# Patient Record
Sex: Male | Born: 1960 | Race: White | Hispanic: No | Marital: Married | Smoking: Current every day smoker
Health system: Southern US, Community
[De-identification: ages and names within clinical notes are randomized; demographics above are authoritative.]

## PROBLEM LIST (undated history)

## (undated) DIAGNOSIS — I10 Essential (primary) hypertension: Secondary | ICD-10-CM

## (undated) DIAGNOSIS — M1990 Unspecified osteoarthritis, unspecified site: Secondary | ICD-10-CM

## (undated) DIAGNOSIS — Z87442 Personal history of urinary calculi: Secondary | ICD-10-CM

## (undated) DIAGNOSIS — M549 Dorsalgia, unspecified: Secondary | ICD-10-CM

## (undated) DIAGNOSIS — K219 Gastro-esophageal reflux disease without esophagitis: Secondary | ICD-10-CM

## (undated) DIAGNOSIS — G4730 Sleep apnea, unspecified: Secondary | ICD-10-CM

## (undated) HISTORY — DX: Essential (primary) hypertension: I10

## (undated) HISTORY — DX: Gastro-esophageal reflux disease without esophagitis: K21.9

## (undated) HISTORY — PX: OTHER SURGICAL HISTORY: SHX169

## (undated) HISTORY — PX: HAND SURGERY: SHX662

## (undated) HISTORY — PX: APPENDECTOMY: SHX54

## (undated) HISTORY — DX: Dorsalgia, unspecified: M54.9

## (undated) MED FILL — Paclitaxel IV Conc 300 MG/50ML (6 MG/ML): INTRAVENOUS | Qty: 17 | Status: AC

## (undated) MED FILL — Dexamethasone Sodium Phosphate Inj 100 MG/10ML: INTRAMUSCULAR | Qty: 1 | Status: AC

## (undated) MED FILL — Carboplatin IV Soln 450 MG/45ML: INTRAVENOUS | Qty: 30 | Status: AC

---

## 2017-11-02 ENCOUNTER — Ambulatory Visit: Payer: 59 | Admitting: Neurology

## 2017-11-02 ENCOUNTER — Telehealth: Payer: Self-pay | Admitting: Neurology

## 2017-11-02 ENCOUNTER — Encounter: Payer: Self-pay | Admitting: Neurology

## 2017-11-02 VITALS — BP 130/92 | HR 98 | Ht 69.0 in | Wt 267.0 lb

## 2017-11-02 DIAGNOSIS — M5440 Lumbago with sciatica, unspecified side: Secondary | ICD-10-CM

## 2017-11-02 DIAGNOSIS — M5416 Radiculopathy, lumbar region: Secondary | ICD-10-CM

## 2017-11-02 MED ORDER — TIZANIDINE HCL 4 MG PO TABS: 4.0000 mg | ORAL_TABLET | Freq: Three times a day (TID) | ORAL | 5 refills | Status: DC | PRN

## 2017-11-02 MED ORDER — NORTRIPTYLINE HCL 10 MG PO CAPS: ORAL_CAPSULE | ORAL | 11 refills | Status: DC

## 2017-11-02 NOTE — Patient Instructions (Addendum)
1. Start nortriptyline 10mg : Take 1 cap every night for 1 week, then increase to 2 caps every night for 2 weeks, then increase to 3 caps every night. Call our office for an update after 2 weeks of taking this dose, we can increase dose further if tolerated  2. Take muscle relaxant tizanidine 4mg  up to every 8 hours to help with pain  3. Refer to [facility] in [place]  4. Refer to Neurosurgery for lumbar radiculopathy and back pain. We are referring you to [facility]. They will call you directly to set up an appointment date and time. If you do not hear from them they can be contacted directly at [phone].

## 2017-11-02 NOTE — Telephone Encounter (Signed)
Referral faxed to [facility] at [phone] with confirmation received. They will contact the patient to schedule.  Referral faxed to [facility] in [place] for physical therapy located at [address] Ph - [phone] Fax - [phone]. Fax confirmation received. They will call patient directly to schedule.

## 2017-11-02 NOTE — Progress Notes (Signed)
NEUROLOGY CONSULTATION NOTE  Ian Case MRN: [id number] DOB: Oct 24, 1960  Referring provider: Dr. Bernie Covey Primary care provider: Dr. Bernie Covey  Reason for consult:  Lumbar radiculopathy  Dear Dr Nance Pew:  Thank you for your kind referral of Ian Case for consultation of the above symptoms. Although his history is well known to you, please allow me to reiterate it for the purpose of our medical record. The patient was accompanied to the clinic by his wife who also provides collateral information. Records and images were personally reviewed where available.  HISTORY OF PRESENT ILLNESS: This is a 57 year old right-handed man with a history of hypertension, GERD, presenting for evaluation of lumbar radiculopathy. He reports chronic back pain, however over the past 2 years or so, he has had pain in his left lower back radiating down below his left buttock region and down his left leg. He describes numbness and burning that is constant on his left leg and foot, but also occasionally affecting his right leg/foot. He occasionally drags his leg when the pain is severe. He has tried Gabapentin and Lyrica which caused side effects of dizziness. He has been taking Oxycontin twice a day with no improvement. He had seen [facility] a year ago or so and had an MRI lumbar spine (report unavailable for review) and 3 injections with no improvement. He was told he needed to lose weight before considering surgery, but exercise has been difficult due to back pain. He has not tried Physical Therapy. He tried Flexeril and Prednisone in the past. He has occasional constipation, no incontinence. Arms are unaffected. He denies any neck pain but has frequent headaches in the back of his head, no associated nausea/vomiting/photo or phonophobia. He has dizziness when looking up. No diplopia, dysarthria/dysphagia.   Diagnostic Data: Lumbar xray done 09/27/2017 showed mild disc space  narrowing at L2-3 and L4-5, anterior osteophyte formation especially at L3-4 and L4-5, advanced facet arthrosis most severe at L4-5 and L5-S1. 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    PAST MEDICAL HISTORY: Past Medical History:  Diagnosis Date  . Back pain   . GERD (gastroesophageal reflux disease)   . Hypertension     PAST SURGICAL HISTORY: Past Surgical History:  Procedure Laterality Date  . APPENDECTOMY      MEDICATIONS: Current Outpatient Medications on File Prior to Visit  Medication Sig Dispense Refill  . Aspirin-Acetaminophen-Caffeine (GOODYS EXTRA STRENGTH) 3511031157 MG PACK Take by mouth.    Marland Kitchen omeprazole (PRILOSEC) 20 MG capsule Take 20 mg by mouth daily.    Marland Kitchen oxyCODONE (OXYCONTIN) 10 mg 12 hr tablet Take 10 mg by mouth every 12 (twelve) hours.    . triamterene-hydrochlorothiazide (DYAZIDE) 37.5-25 MG capsule Take 1 capsule by mouth daily.     No current facility-administered medications on file prior to visit.     ALLERGIES: No Known Allergies  FAMILY HISTORY: Family History  Problem Relation Age of Onset  . Thyroid disease Mother   . Heart disease Father   . Healthy Daughter   . Healthy Son     SOCIAL HISTORY: Social History   Socioeconomic History  . Marital status: Married    Spouse name: Not on file  . Number of children: Not on file  . Years of education: Not on file  . Highest education level: Not on file  Occupational History  . Not on file  Social Needs  . Financial  resource strain: Not on file  . Food insecurity:    Worry: Not on file    Inability: Not on file  . Transportation needs:    Medical: Not on file    Non-medical: Not on file  Tobacco Use  . Smoking status: Current Every Day Smoker  . Smokeless tobacco: Never Used  Substance and Sexual Activity  . Alcohol use: Yes    Comment: 3-4 drinks daily  . Drug use: Never  . Sexual activity: Not on file    Lifestyle  . Physical activity:    Days per week: Not on file    Minutes per session: Not on file  . Stress: Not on file  Relationships  . Social connections:    Talks on phone: Not on file    Gets together: Not on file    Attends religious service: Not on file    Active member of club or organization: Not on file    Attends meetings of clubs or organizations: Not on file    Relationship status: Not on file  . Intimate partner violence:    Fear of current or ex partner: Not on file    Emotionally abused: Not on file    Physically abused: Not on file    Forced sexual activity: Not on file  Other Topics Concern  . Not on file  Social History Narrative  . Not on file    REVIEW OF SYSTEMS: Constitutional: No fevers, chills, or sweats, no generalized fatigue, change in appetite Eyes: No visual changes, double vision, eye pain Ear, nose and throat: No hearing loss, ear pain, nasal congestion, sore throat Cardiovascular: No chest pain, palpitations Respiratory:  No shortness of breath at rest or with exertion, wheezes GastrointestinaI: No nausea, vomiting, diarrhea, abdominal pain, fecal incontinence Genitourinary:  No dysuria, urinary retention or frequency Musculoskeletal:  No neck pain, back pain Integumentary: No rash, pruritus, skin lesions Neurological: as above Psychiatric: No depression, insomnia, anxiety Endocrine: No palpitations, fatigue, diaphoresis, mood swings, change in appetite, change in weight, increased thirst Hematologic/Lymphatic:  No anemia, purpura, petechiae. Allergic/Immunologic: no itchy/runny eyes, nasal congestion, recent allergic reactions, rashes  PHYSICAL EXAM: Vitals:   11/02/17 0837  BP: (!) 130/92  Pulse: 98  SpO2: 96%   General: No acute distress Head:  Normocephalic/atraumatic Eyes: Fundoscopic exam shows bilateral sharp discs, no vessel changes, exudates, or hemorrhages Neck: supple, no paraspinal tenderness, full range of  motion Back: No paraspinal tenderness Heart: regular rate and rhythm Lungs: Clear to auscultation bilaterally. Vascular: No carotid bruits. Skin/Extremities: No rash, no edema Neurological Exam: Mental status: alert and oriented to person, place, and time, no dysarthria or aphasia, Fund of knowledge is appropriate.  Recent and remote memory are intact.  Attention and concentration are normal.    Able to name objects and repeat phrases. Cranial nerves: CN I: not tested CN II: pupils equal, round and reactive to light, visual fields intact, fundi unremarkable. CN III, IV, VI:  full range of motion, no nystagmus, no ptosis CN V: facial sensation intact CN VII: upper and lower face symmetric CN VIII: hearing intact to finger rub CN IX, X: gag intact, uvula midline CN XI: sternocleidomastoid and trapezius muscles intact CN XII: tongue midline Bulk & Tone: normal, no fasciculations. Motor: 5/5 throughout with no pronator drift. Sensation: intact to light touch, cold, pin on both UE, decreased pin, cold in the distribution of mostly left L4, L5 distribution (inner calf, dorsum and sole of foot). Decreased vibration sense  to left knee.  No extinction to double simultaneous stimulation.  Romberg test negative Deep Tendon Reflexes: +2 throughout, no ankle clonus Plantar responses: downgoing bilaterally Cerebellar: no incoordination on finger to nose testing Gait: antalgic gait avoiding weight-bearing on left leg due to pain in left lower back   IMPRESSION: This is a 57 year old right-handed man with a history of chronic back pain that over the past year or two has worsened now radiating down his left leg with burning pain and numbness. Pain has been unresponsive to oxycodone and Flexeril. He has seen Ortho in the past and had an MRI lumbar spine, results unavailable and have been requested for review. Xray report from PCP indicates multilevel degenerative change with advanced facet arthrosis most  severe at L4-5 and L5-S1. Exam shows sensory changes in the L4-5 distribution, no weakness noted, significant pain reported. We discussed the importance of physical therapy, he is agreeable to trying it but does not feel it will help. Start nortriptyline for lumbar radiculopathy (tried gabapentin, Lyrica in the past), side effects discussed, we may uptitrate as tolerated. He will try a different muscle relaxant, Tizanidine, side effects discussed. We discussed obtaining a second opinion from Neurosurgery regarding treatment options (ESI, surgery). He will follow-up in 6 months and knows to call for any changes.   Thank you for allowing me to participate in the care of this patient. Please do not hesitate to call for any questions or concerns.   Ellouise Newer, M.D.  CC: Dr. Nance Pew

## 2017-11-02 NOTE — Telephone Encounter (Signed)
Records from [facility] clinic reviewed, MRI was done in Feb 2018 at [facility], it showed L4-5 bilateral neural foraminal narrowing. He tried gabapentin, Cymbalta, medrol dosepak, 3 epidurals. It was noted that if no improvement, send to Dr. Jimmye [name] for possible surgical counseling.

## 2017-11-04 ENCOUNTER — Telehealth: Payer: Self-pay

## 2017-11-04 NOTE — Telephone Encounter (Signed)
Received notice from [facility].  Pt has NP appointment on 11/17/17 @ 2:15pm

## 2017-12-06 ENCOUNTER — Other Ambulatory Visit: Payer: Self-pay | Admitting: Neurosurgery

## 2017-12-06 DIAGNOSIS — M5440 Lumbago with sciatica, unspecified side: Secondary | ICD-10-CM

## 2017-12-06 DIAGNOSIS — G959 Disease of spinal cord, unspecified: Secondary | ICD-10-CM

## 2017-12-10 ENCOUNTER — Ambulatory Visit
Admission: RE | Admit: 2017-12-10 | Discharge: 2017-12-10 | Disposition: A | Discharge status: Home / Self Care | Payer: 59 | Source: Ambulatory Visit | Attending: Neurosurgery | Admitting: Neurosurgery

## 2017-12-10 DIAGNOSIS — M5440 Lumbago with sciatica, unspecified side: Secondary | ICD-10-CM

## 2017-12-10 DIAGNOSIS — G959 Disease of spinal cord, unspecified: Secondary | ICD-10-CM

## 2017-12-11 ENCOUNTER — Other Ambulatory Visit: Payer: 59

## 2017-12-13 ENCOUNTER — Encounter (HOSPITAL_COMMUNITY): Payer: Self-pay

## 2017-12-13 ENCOUNTER — Emergency Department (HOSPITAL_COMMUNITY)
Admission: EM | Admit: 2017-12-13 | Discharge: 2017-12-13 | Disposition: A | Discharge status: Home / Self Care | Payer: 59 | Attending: Emergency Medicine | Admitting: Emergency Medicine

## 2017-12-13 ENCOUNTER — Other Ambulatory Visit: Payer: Self-pay | Admitting: Neurosurgery

## 2017-12-13 ENCOUNTER — Other Ambulatory Visit: Payer: Self-pay

## 2017-12-13 DIAGNOSIS — M545 Low back pain: Secondary | ICD-10-CM | POA: Diagnosis present

## 2017-12-13 DIAGNOSIS — Z5321 Procedure and treatment not carried out due to patient leaving prior to being seen by health care provider: Secondary | ICD-10-CM | POA: Insufficient documentation

## 2017-12-13 DIAGNOSIS — M25551 Pain in right hip: Secondary | ICD-10-CM | POA: Diagnosis not present

## 2017-12-13 DIAGNOSIS — M25552 Pain in left hip: Secondary | ICD-10-CM | POA: Diagnosis not present

## 2017-12-13 MED ORDER — OXYCODONE-ACETAMINOPHEN 5-325 MG PO TABS
1.0000 | ORAL_TABLET | ORAL | Status: DC | PRN
  Administered 2017-12-13: 1 via ORAL
  Filled 2017-12-13: qty 1

## 2017-12-13 NOTE — ED Triage Notes (Signed)
Patient here for lower back pain and bilateral hip pain.  Has appointment to see Saintclair Halsted MD with neurosurgery in the AM but pain got worse and could not make it to see him.  Taking hydrocodone and ibuprofen for pain management.

## 2017-12-21 NOTE — Pre-Procedure Instructions (Signed)
Ian Case  12/21/2017      [facility] - [place], [place] [address] [place] [address] Phone: [phone] Fax: [phone]    Your procedure is scheduled on December 28, 2017.  Report to [facility] Admitting at [phone] AM.  Call this number if you have problems the morning of surgery:  [phone]   Remember:  Do not eat or drink after midnight.    Take these medicines the morning of surgery with A SIP OF WATER  Hydrocodone-acetaminophen (norco)-if needed for pain Omeprazole (prilosec) Tizanidine (zanaflex)-if needed for muscle spasms  7 days prior to surgery STOP taking any Aspirin (unless otherwise instructed by your surgeon), Aleve, Naproxen, Ibuprofen, Motrin, Advil, Goody's, BC's, all herbal medications, fish oil, and all vitamins   [facility]- Preparing For Surgery  Before surgery, you can play an important role. Because skin is not sterile, your skin needs to be as free of germs as possible. You can reduce the number of germs on your skin by washing with CHG (chlorahexidine gluconate) Soap before surgery.  CHG is an antiseptic cleaner which kills germs and bonds with the skin to continue killing germs even after washing.    Oral Hygiene is also important to reduce your risk of infection.  Remember - BRUSH YOUR TEETH THE MORNING OF SURGERY WITH YOUR REGULAR TOOTHPASTE  Please do not use if you have an allergy to CHG or antibacterial soaps. If your skin becomes reddened/irritated stop using the CHG.  Do not shave (including legs and underarms) for at least 48 hours prior to first CHG shower. It is OK to shave your face.  Please follow these instructions carefully.   1. Shower the NIGHT BEFORE SURGERY and the MORNING OF SURGERY with CHG.   2. If you chose to wash your hair, wash your hair first as usual with your normal shampoo.  3. After you shampoo, rinse your hair and body thoroughly to remove the shampoo.  4. Use CHG  as you would any other liquid soap. You can apply CHG directly to the skin and wash gently with a scrungie or a clean washcloth.   5. Apply the CHG Soap to your body ONLY FROM THE NECK DOWN.  Do not use on open wounds or open sores. Avoid contact with your eyes, ears, mouth and genitals (private parts). Wash Face and genitals (private parts)  with your normal soap.  6. Wash thoroughly, paying special attention to the area where your surgery will be performed.  7. Thoroughly rinse your body with warm water from the neck down.  8. DO NOT shower/wash with your normal soap after using and rinsing off the CHG Soap.  9. Pat yourself dry with a CLEAN TOWEL.  10. Wear CLEAN PAJAMAS to bed the night before surgery, wear comfortable clothes the morning of surgery  11. Place CLEAN SHEETS on your bed the night of your first shower and DO NOT SLEEP WITH PETS.  Day of Surgery:  Do not apply any deodorants/lotions.  Please wear clean clothes to the hospital/surgery center.   Remember to brush your teeth WITH YOUR REGULAR TOOTHPASTE.   Do not wear jewelry  Do not wear lotions, powders, or colognes, or deodorant.  Men may shave face and neck.  Do not bring valuables to the hospital.   [facility] is not responsible for any belongings or valuables.  Contacts, dentures or bridgework may not be worn into surgery.  Leave your  suitcase in the car.  After surgery it may be brought to your room.  For patients admitted to the hospital, discharge time will be determined by your treatment team.  Patients discharged the day of surgery will not be allowed to drive home.   Please read over the following fact sheets that you were given.

## 2017-12-22 ENCOUNTER — Encounter (HOSPITAL_COMMUNITY): Payer: Self-pay

## 2017-12-22 ENCOUNTER — Encounter (HOSPITAL_COMMUNITY)
Admission: RE | Admit: 2017-12-22 | Discharge: 2017-12-22 | Disposition: A | Discharge status: Home / Self Care | Payer: 59 | Source: Ambulatory Visit | Attending: Neurosurgery | Admitting: Neurosurgery

## 2017-12-22 ENCOUNTER — Other Ambulatory Visit: Payer: Self-pay

## 2017-12-22 DIAGNOSIS — Z6841 Body Mass Index (BMI) 40.0 and over, adult: Secondary | ICD-10-CM | POA: Insufficient documentation

## 2017-12-22 DIAGNOSIS — M48061 Spinal stenosis, lumbar region without neurogenic claudication: Secondary | ICD-10-CM | POA: Insufficient documentation

## 2017-12-22 DIAGNOSIS — Z87891 Personal history of nicotine dependence: Secondary | ICD-10-CM | POA: Insufficient documentation

## 2017-12-22 DIAGNOSIS — K219 Gastro-esophageal reflux disease without esophagitis: Secondary | ICD-10-CM | POA: Diagnosis not present

## 2017-12-22 DIAGNOSIS — G4733 Obstructive sleep apnea (adult) (pediatric): Secondary | ICD-10-CM | POA: Diagnosis not present

## 2017-12-22 DIAGNOSIS — Z79899 Other long term (current) drug therapy: Secondary | ICD-10-CM | POA: Insufficient documentation

## 2017-12-22 DIAGNOSIS — Z01818 Encounter for other preprocedural examination: Secondary | ICD-10-CM | POA: Diagnosis present

## 2017-12-22 DIAGNOSIS — I10 Essential (primary) hypertension: Secondary | ICD-10-CM | POA: Insufficient documentation

## 2017-12-22 HISTORY — DX: Sleep apnea, unspecified: G47.30

## 2017-12-22 HISTORY — DX: Personal history of urinary calculi: Z87.442

## 2017-12-22 HISTORY — DX: Unspecified osteoarthritis, unspecified site: M19.90

## 2017-12-22 LAB — CBC
HCT: 45 % (ref 39.0–52.0)
Hemoglobin: 15.6 g/dL (ref 13.0–17.0)
MCH: 35.3 pg — ABNORMAL HIGH (ref 26.0–34.0)
MCHC: 34.7 g/dL (ref 30.0–36.0)
MCV: 101.8 fL — ABNORMAL HIGH (ref 78.0–100.0)
PLATELETS: 218 10*3/uL (ref 150–400)
RBC: 4.42 MIL/uL (ref 4.22–5.81)
RDW: 13.3 % (ref 11.5–15.5)
WBC: 6.2 10*3/uL (ref 4.0–10.5)

## 2017-12-22 LAB — BASIC METABOLIC PANEL
Anion gap: 12 (ref 5–15)
BUN: 12 mg/dL (ref 6–20)
CALCIUM: 8.6 mg/dL — AB (ref 8.9–10.3)
CO2: 28 mmol/L (ref 22–32)
Chloride: 104 mmol/L (ref 98–111)
Creatinine, Ser: 0.83 mg/dL (ref 0.61–1.24)
GFR calc Af Amer: >60 mL/min (ref >60)
Glucose, Bld: 116 mg/dL — ABNORMAL HIGH (ref 70–99)
Potassium: 3.2 mmol/L — ABNORMAL LOW (ref 3.5–5.1)
SODIUM: 144 mmol/L (ref 135–145)

## 2017-12-22 LAB — SURGICAL PCR SCREEN
MRSA, PCR: NEGATIVE
STAPHYLOCOCCUS AUREUS: NEGATIVE

## 2017-12-22 MED ORDER — SUGAMMADEX SODIUM 200 MG/2ML IV SOLN
INTRAVENOUS | Status: AC
  Filled 2017-12-22: qty 4

## 2017-12-22 NOTE — Progress Notes (Addendum)
PCP: Bernie Covey, MD  Cardiologist: pt denies  EKG: pt denies past year, obtained today  Stress test: pt denies  ECHO: pt denies  Cardiac Cath: pt denies ever  Chest x-ray: pt denies past year, no recent respiratory infection/complications

## 2017-12-23 NOTE — Progress Notes (Signed)
Anesthesia Chart Review:  Case:  [id number] Date/Time:  12/28/17 1100   Procedure:  Laminectomy and Foraminotomy L2-L3 - L3-L4 - L4-L5 (N/A Back)   Anesthesia type:  General   Pre-op diagnosis:  Stenosis   Location:  [facility] OR ROOM 21 / [facility]   Surgeon:  Kary Kos, MD      DISCUSSION: Patient is a 57 year old male scheduled for the above procedure.   History includes smoking, HTN, GERD, OSA (does not have CPAP yet), back pain. BMI is consistent with morbid obesity. He is s/p appendectomy in [place] 10/24/16--per anesthesia records, Mallampati IV, TM distance > 3 FB, neck full ROM, mask difficulty assessment: "1 - vent by mask"; oral 8.0 mm ETT inserted with direct laryngoscopy, Macintosh #4, one attempt; Cormack-Lehane-Classification: grade 1.  If no acute changes then I would anticipate that he can proceed as planned. By notes, not currently using CPAP--defer post-operative monitoring recommendations to surgeon and/or anesthesiologist.    VS: BP 135/83   Pulse 98   Temp 36.6 C   Resp 18   Ht 5\' 9"  (1.753 m)   Wt 123.3 kg   SpO2 99%   BMI 40.15 kg/m    PROVIDERS: Bernie Covey, MD is PCP (Care Everywhere) Ellouise Newer, MD is neurologist (lumbar radiculopathy)   LABS: Labs reviewed: Acceptable for surgery. 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 However, while non-supine, good control was observed on CPAP at 11 cm   warer using a Simplus size med mask.There were moderate PLMS with an index of   59 /hr and PLMS arousal index of 1 /hr. EKG indicates normal sinus rhythm.   EEG is normal.  RECOMMENDATIONS include:   -  PAP may be initiated with the above pressure and interface and non-supine   positioning to treat the patient's sleep-disordered breathing.  -  A titration of VPAP with ASV may be considered for this patient with   complex apnea syndrome poorly tolerant of CPAP and Bilevel in non-supine   positioning cannot be maintained.  - If the patient is poorly tolerant to PAP, further anatomic evaluation for   possible surgical intervention may also be considered.    IMAGES: MR L-spine 12/10/17: IMPRESSION: 1. Progression of broad-based disc protrusion and facet disease at L4-5 with progressive severe left and moderate right foraminal stenosis. 2. Mild foraminal narrowing bilaterally at L2-3 and L3-4 is stable. 3. Mild disc bulging and facet hypertrophy at L1-2 is stable without significant stenosis or change. 4. Epidural lipomatosis contributes to crowding of nerve roots at L3-4, L4-5, and L5-S1.  MR C-spine 12/10/17: IMPRESSION: 1. Uncovertebral spurring in the cervical spine results in right greater than left foraminal narrowing at C3-4, C4-5, and C5-6 as described. 2.  Mild right foraminal narrowing is present at C2-3 and C6-7. 3. Partial effacement of ventral CSF at C3-4, right worse than left. No significant central canal stenosis is present.   EKG: 12/22/17: ST at 101 bpm. HR 98 bpm with vitals. Patient reported being in pain at PAT.    CV: N/A  Past Medical History:  Diagnosis Date  . Arthritis   . Back pain   . GERD (gastroesophageal reflux disease)   . History of kidney stones   . Hypertension   . Sleep apnea    does not have cpap yet    Past  Surgical History:  Procedure Laterality Date  . APPENDECTOMY    . HAND SURGERY      MEDICATIONS: . Aspirin-Acetaminophen-Caffeine (GOODYS EXTRA STRENGTH) 500-325-65 MG PACK  . HYDROcodone-acetaminophen (NORCO/VICODIN) 5-325 MG tablet  . ibuprofen (ADVIL,MOTRIN) 200 MG tablet  . nortriptyline (PAMELOR) 10 MG capsule  . omeprazole (PRILOSEC) 20 MG capsule  . tiZANidine (ZANAFLEX) 4 MG tablet  . triamterene-hydrochlorothiazide (MAXZIDE-25) 37.5-25 MG tablet   No current facility-administered medications for this encounter.   Patient reported not taking nortriptyline.    George Hugh [facility] Short Stay Center/Anesthesiology Phone [phone] 12/23/2017 10:46 AM

## 2017-12-25 ENCOUNTER — Emergency Department (HOSPITAL_COMMUNITY)
Admission: EM | Admit: 2017-12-25 | Discharge: 2017-12-25 | Disposition: A | Discharge status: Home / Self Care | Payer: Managed Care, Other (non HMO) | Attending: Emergency Medicine | Admitting: Emergency Medicine

## 2017-12-25 ENCOUNTER — Encounter (HOSPITAL_COMMUNITY): Payer: Self-pay | Admitting: *Deleted

## 2017-12-25 ENCOUNTER — Other Ambulatory Visit: Payer: Self-pay

## 2017-12-25 DIAGNOSIS — I10 Essential (primary) hypertension: Secondary | ICD-10-CM | POA: Diagnosis not present

## 2017-12-25 DIAGNOSIS — G8929 Other chronic pain: Secondary | ICD-10-CM | POA: Diagnosis not present

## 2017-12-25 DIAGNOSIS — F17200 Nicotine dependence, unspecified, uncomplicated: Secondary | ICD-10-CM | POA: Insufficient documentation

## 2017-12-25 DIAGNOSIS — Z79899 Other long term (current) drug therapy: Secondary | ICD-10-CM | POA: Diagnosis not present

## 2017-12-25 DIAGNOSIS — M545 Low back pain: Secondary | ICD-10-CM | POA: Diagnosis not present

## 2017-12-25 DIAGNOSIS — M549 Dorsalgia, unspecified: Secondary | ICD-10-CM | POA: Diagnosis present

## 2017-12-25 LAB — CBC WITH DIFFERENTIAL/PLATELET
Abs Immature Granulocytes: 0.1 10*3/uL (ref 0.0–0.1)
BASOS PCT: 1 %
Basophils Absolute: 0 10*3/uL (ref 0.0–0.1)
EOS ABS: 0 10*3/uL (ref 0.0–0.7)
Eosinophils Relative: 1 %
HCT: 52.5 % — ABNORMAL HIGH (ref 39.0–52.0)
Hemoglobin: 18.9 g/dL — ABNORMAL HIGH (ref 13.0–17.0)
IMMATURE GRANULOCYTES: 2 %
Lymphocytes Relative: 35 %
Lymphs Abs: 2.2 10*3/uL (ref 0.7–4.0)
MCH: 35.3 pg — ABNORMAL HIGH (ref 26.0–34.0)
MCHC: 36 g/dL (ref 30.0–36.0)
MCV: 98.1 fL (ref 78.0–100.0)
Monocytes Absolute: 0.7 10*3/uL (ref 0.1–1.0)
Monocytes Relative: 12 %
NEUTROS PCT: 49 %
Neutro Abs: 3.1 10*3/uL (ref 1.7–7.7)
PLATELETS: 201 10*3/uL (ref 150–400)
RBC: 5.35 MIL/uL (ref 4.22–5.81)
RDW: 13.4 % (ref 11.5–15.5)
WBC: 6.2 10*3/uL (ref 4.0–10.5)

## 2017-12-25 LAB — I-STAT CHEM 8, ED
BUN: 24 mg/dL — ABNORMAL HIGH (ref 6–20)
CALCIUM ION: 1.15 mmol/L (ref 1.15–1.40)
Chloride: 100 mmol/L (ref 98–111)
Creatinine, Ser: 0.8 mg/dL (ref 0.61–1.24)
GLUCOSE: 97 mg/dL (ref 70–99)
HCT: 55 % — ABNORMAL HIGH (ref 39.0–52.0)
Hemoglobin: 18.7 g/dL — ABNORMAL HIGH (ref 13.0–17.0)
POTASSIUM: 4.2 mmol/L (ref 3.5–5.1)
Sodium: 131 mmol/L — ABNORMAL LOW (ref 135–145)
TCO2: 22 mmol/L (ref 22–32)

## 2017-12-25 LAB — URINALYSIS, ROUTINE W REFLEX MICROSCOPIC
Bilirubin Urine: NEGATIVE
GLUCOSE, UA: NEGATIVE mg/dL
Hgb urine dipstick: NEGATIVE
KETONES UR: 20 mg/dL — AB
LEUKOCYTES UA: NEGATIVE
Nitrite: NEGATIVE
PROTEIN: NEGATIVE mg/dL
Specific Gravity, Urine: 1.023 (ref 1.005–1.030)
pH: 5 (ref 5.0–8.0)

## 2017-12-25 LAB — BASIC METABOLIC PANEL
ANION GAP: 16 — AB (ref 5–15)
BUN: 18 mg/dL (ref 6–20)
CALCIUM: 9.6 mg/dL (ref 8.9–10.3)
CO2: 20 mmol/L — ABNORMAL LOW (ref 22–32)
Chloride: 94 mmol/L — ABNORMAL LOW (ref 98–111)
Creatinine, Ser: 0.78 mg/dL (ref 0.61–1.24)
GFR calc non Af Amer: >60 mL/min (ref >60)
GLUCOSE: 92 mg/dL (ref 70–99)
Potassium: 7.1 mmol/L (ref 3.5–5.1)
SODIUM: 130 mmol/L — AB (ref 135–145)

## 2017-12-25 MED ORDER — DIAZEPAM 5 MG PO TABS
5.0000 mg | ORAL_TABLET | Freq: Once | ORAL | Status: AC
  Administered 2017-12-25: 5 mg via ORAL
  Filled 2017-12-25: qty 1

## 2017-12-25 MED ORDER — HYDROMORPHONE HCL 1 MG/ML IJ SOLN
1.0000 mg | Freq: Once | INTRAMUSCULAR | Status: AC
  Administered 2017-12-25: 0.5 mg via INTRAVENOUS
  Filled 2017-12-25: qty 1

## 2017-12-25 MED ORDER — HYDROMORPHONE HCL 1 MG/ML IJ SOLN
1.0000 mg | Freq: Once | INTRAMUSCULAR | Status: AC
  Administered 2017-12-25: 1 mg via INTRAVENOUS
  Filled 2017-12-25: qty 1

## 2017-12-25 MED ORDER — ONDANSETRON 4 MG PO TBDP: 4.0000 mg | ORAL_TABLET | Freq: Three times a day (TID) | ORAL | 0 refills | Status: DC | PRN

## 2017-12-25 MED ORDER — OXYCODONE-ACETAMINOPHEN 5-325 MG PO TABS
1.0000 | ORAL_TABLET | Freq: Once | ORAL | Status: AC
  Administered 2017-12-25: 1 via ORAL
  Filled 2017-12-25: qty 1

## 2017-12-25 MED ORDER — OXYCODONE-ACETAMINOPHEN 5-325 MG PO TABS: 1.0000 | ORAL_TABLET | ORAL | 0 refills | Status: DC | PRN

## 2017-12-25 MED ORDER — HYDROMORPHONE HCL 1 MG/ML IJ SOLN
0.5000 mg | Freq: Once | INTRAMUSCULAR | Status: AC
  Administered 2017-12-25: 0.5 mg via INTRAVENOUS
  Filled 2017-12-25: qty 1

## 2017-12-25 MED ORDER — ONDANSETRON HCL 4 MG/2ML IJ SOLN
4.0000 mg | Freq: Once | INTRAMUSCULAR | Status: AC
  Administered 2017-12-25: 4 mg via INTRAVENOUS
  Filled 2017-12-25: qty 2

## 2017-12-25 NOTE — ED Notes (Signed)
ED Provider at bedside. 

## 2017-12-25 NOTE — ED Notes (Signed)
Pt IV was wrapped up with Coban due to being diaphoretic and at an attempt to keep the IV in place, pt complaining of IV bothering him where it was wrapped. This RN unwrapped the Coban and noticed when giving the pt the dilaudid there was fluid leaking from the connector on the tubing. I unwrapped all the tape off of the tubing and rewrapped it. There was no leaking from the tubing when this RN reapplied new tape it and flushed line with saline. Pt only got 0.5mg  dilaudid instead of the ordered 1mg  due to it leaking on pt hand. NP notified.

## 2017-12-25 NOTE — ED Triage Notes (Signed)
Pt reports lower back pain, hx of same. Is scheduled for surgery on wed but unable to tolerate the pain. Is able to ambulate with no distress.

## 2017-12-25 NOTE — ED Notes (Signed)
Called Dr. Sedonia Small, reported repeat K 4.1, Sodium 131.  Orders to d/c, advised Debroah Baller, NP Dr. Sedonia Small orders.

## 2017-12-25 NOTE — ED Notes (Signed)
Per lab, Potassium is reading 7.1 and is "very hemolyzed."

## 2017-12-25 NOTE — ED Provider Notes (Signed)
[facility] DEPARTMENT Provider Note   CSN: [id number] Arrival date & time: 12/25/17  1124     History   Chief Complaint Chief Complaint  Patient presents with  . Back Pain    HPI Ian Case is a 57 y.o. male who presents to the ED with back pain. Patient has hx of back pain and is scheduled to have surgery in 3 days but unable to tolerate the pain.   HPI  Past Medical History:  Diagnosis Date  . Arthritis   . Back pain   . GERD (gastroesophageal reflux disease)   . History of kidney stones   . Hypertension   . Sleep apnea    does not have cpap yet    There are no active problems to display for this patient.   Past Surgical History:  Procedure Laterality Date  . APPENDECTOMY    . HAND SURGERY          Home Medications    Prior to Admission medications   Medication Sig Start Date End Date Taking? Authorizing Provider  Aspirin-Acetaminophen-Caffeine (Ian Case) [phone] MG PACK Take 1 packet by mouth 2 (two) times daily as needed (for pain.).     [provider]  ibuprofen (ADVIL,MOTRIN) 200 MG tablet Take 400 mg by mouth every 8 (eight) hours as needed (for pain.).    [provider]  nortriptyline (PAMELOR) 10 MG capsule Take 1 cap every night for 1 week, then increase to 2 caps every night for 2 weeks, then increase to 3 caps every night Patient not taking: Reported on 12/15/2017 11/02/17   Cameron Sprang, MD  omeprazole (PRILOSEC) 20 MG capsule Take 20 mg by mouth daily.    [provider]  ondansetron (ZOFRAN ODT) 4 MG disintegrating tablet Take 1 tablet (4 mg total) by mouth every 8 (eight) hours as needed for nausea or vomiting. 12/25/17   Ashley Murrain, NP  oxyCODONE-acetaminophen (PERCOCET/ROXICET) 5-325 MG tablet Take 1-2 tablets by mouth every 4 (four) hours as needed for severe pain. 12/25/17   Ashley Murrain, NP  tiZANidine (ZANAFLEX) 4 MG tablet Take 1 tablet (4 mg total) by mouth  every 8 (eight) hours as needed for muscle spasms. 11/02/17   Cameron Sprang, MD  triamterene-hydrochlorothiazide (MAXZIDE-25) 37.5-25 MG tablet Take 1 tablet by mouth daily. 11/17/17   [provider]    Family History Family History  Problem Relation Age of Onset  . Thyroid disease Mother   . Heart disease Father   . Healthy Daughter   . Healthy Son     Social History Social History   Tobacco Use  . Smoking status: Current Every Day Smoker    Packs/day: 1.50  . Smokeless tobacco: Never Used  Substance Use Topics  . Alcohol use: Yes    Comment: 3-4 drinks daily  . Drug use: Never     Allergies   Patient has no known allergies.   Review of Systems Review of Systems  Constitutional: Positive for diaphoresis.  Musculoskeletal: Positive for back pain.     Physical Exam Updated Vital Signs BP (!) 160/110   Pulse (!) 117   Temp (!) 97.5 F (36.4 C) (Oral)   Resp 18   SpO2 95%   Physical Exam  Constitutional: He appears well-developed and well-nourished. No distress.  HENT:  Head: Normocephalic.  Eyes: EOM are normal.  Neck: Neck supple.  Cardiovascular: Tachycardia present.  Pulmonary/Chest: Effort normal.  Musculoskeletal:  Tenderness to lower back.  Neurological: He is alert.  Patient is able to ambulate but with severe pain.   Skin: Skin is warm and dry.  Psychiatric: He has a normal mood and affect.  Nursing note and vitals reviewed.  Patient is unable to get comfortable he goes from sitting in a straight chair to standing.   ED Treatments / Results  Labs (all labs ordered are listed, but only abnormal results are displayed) Labs Reviewed  CBC WITH DIFFERENTIAL/PLATELET - Abnormal; Notable for the following components:      Result Value   Hemoglobin 18.9 (*)    HCT 52.5 (*)    MCH 35.3 (*)    All other components within normal limits  BASIC METABOLIC PANEL - Abnormal; Notable for the following components:   Sodium 130 (*)     Potassium 7.1 (*)    Chloride 94 (*)    CO2 20 (*)    Anion gap 16 (*)    All other components within normal limits  URINALYSIS, ROUTINE W REFLEX MICROSCOPIC - Abnormal; Notable for the following components:   APPearance HAZY (*)    Ketones, ur 20 (*)    All other components within normal limits  I-STAT CHEM 8, ED - Abnormal; Notable for the following components:   Sodium 131 (*)    BUN 24 (*)    Hemoglobin 18.7 (*)    HCT 55.0 (*)    All other components within normal limits   Dr. Sedonia Small in to see the patient and discuss options. He offered admission for pain management. Patient has had 2 doses of Dilaudid and Valium IV. Will try an additional dose of pain medication and patient will make decision.  Radiology No results found.  Procedures Procedures (including critical care time)  Medications Ordered in ED Medications  HYDROmorphone (DILAUDID) injection 1 mg (1 mg Intravenous Given 12/25/17 1308)  ondansetron (ZOFRAN) injection 4 mg (4 mg Intravenous Given 12/25/17 1308)  diazepam (VALIUM) tablet 5 mg (5 mg Oral Given 12/25/17 1426)  HYDROmorphone (DILAUDID) injection 1 mg (1 mg Intravenous Given 12/25/17 1531)  HYDROmorphone (DILAUDID) injection 1 mg (0.5 mg Intravenous Given 12/25/17 1645)  HYDROmorphone (DILAUDID) injection 0.5 mg (0.5 mg Intravenous Given 12/25/17 1653)  oxyCODONE-acetaminophen (PERCOCET/ROXICET) 5-325 MG per tablet 1 tablet (1 tablet Oral Given 12/25/17 1753)   Patient reports he wants to go home after last dose of Dilaudid and percocet PO. Will d/c home with a few percocet and he will call his neurosurgeon in the morning.   Initial Impression / Assessment and Plan / ED Course  I have reviewed the triage vital signs and the nursing notes.  Final Clinical Impressions(s) / ED Diagnoses   Final diagnoses:  Acute exacerbation of chronic low back pain    ED Discharge Orders         Ordered    ondansetron (ZOFRAN ODT) 4 MG disintegrating tablet  Every 8 hours PRN      12/25/17 1835    oxyCODONE-acetaminophen (PERCOCET/ROXICET) 5-325 MG tablet  Every 4 hours PRN     12/25/17 [place], [place], NP 12/26/17 2303    Maudie Flakes, MD 12/27/17 0100

## 2017-12-28 ENCOUNTER — Encounter (HOSPITAL_COMMUNITY): Payer: Self-pay | Admitting: Certified Registered Nurse Anesthetist

## 2017-12-28 ENCOUNTER — Inpatient Hospital Stay (HOSPITAL_COMMUNITY): Payer: 59 | Admitting: Certified Registered Nurse Anesthetist

## 2017-12-28 ENCOUNTER — Inpatient Hospital Stay (HOSPITAL_COMMUNITY)
Admission: RE | Admit: 2017-12-28 | Discharge: 2017-12-29 | DRG: 516 | Disposition: A | Discharge status: Home / Self Care | Payer: 59 | Source: Ambulatory Visit | Attending: Neurosurgery | Admitting: Neurosurgery

## 2017-12-28 ENCOUNTER — Encounter (HOSPITAL_COMMUNITY)
Admission: RE | Disposition: A | Discharge status: Home / Self Care | Payer: Self-pay | Source: Ambulatory Visit | Attending: Neurosurgery

## 2017-12-28 ENCOUNTER — Other Ambulatory Visit: Payer: Self-pay

## 2017-12-28 ENCOUNTER — Inpatient Hospital Stay (HOSPITAL_COMMUNITY): Payer: 59 | Admitting: Vascular Surgery

## 2017-12-28 ENCOUNTER — Observation Stay (HOSPITAL_COMMUNITY): Payer: 59

## 2017-12-28 DIAGNOSIS — Z9889 Other specified postprocedural states: Secondary | ICD-10-CM

## 2017-12-28 DIAGNOSIS — G4730 Sleep apnea, unspecified: Secondary | ICD-10-CM | POA: Diagnosis present

## 2017-12-28 DIAGNOSIS — F17210 Nicotine dependence, cigarettes, uncomplicated: Secondary | ICD-10-CM | POA: Diagnosis present

## 2017-12-28 DIAGNOSIS — E669 Obesity, unspecified: Secondary | ICD-10-CM | POA: Diagnosis present

## 2017-12-28 DIAGNOSIS — M48061 Spinal stenosis, lumbar region without neurogenic claudication: Principal | ICD-10-CM | POA: Diagnosis present

## 2017-12-28 DIAGNOSIS — Z79899 Other long term (current) drug therapy: Secondary | ICD-10-CM

## 2017-12-28 DIAGNOSIS — Z419 Encounter for procedure for purposes other than remedying health state, unspecified: Secondary | ICD-10-CM

## 2017-12-28 DIAGNOSIS — I10 Essential (primary) hypertension: Secondary | ICD-10-CM | POA: Diagnosis present

## 2017-12-28 DIAGNOSIS — K219 Gastro-esophageal reflux disease without esophagitis: Secondary | ICD-10-CM | POA: Diagnosis present

## 2017-12-28 DIAGNOSIS — Z6841 Body Mass Index (BMI) 40.0 and over, adult: Secondary | ICD-10-CM

## 2017-12-28 HISTORY — PX: LUMBAR LAMINECTOMY/DECOMPRESSION MICRODISCECTOMY: SHX5026

## 2017-12-28 SURGERY — LUMBAR LAMINECTOMY/DECOMPRESSION MICRODISCECTOMY 3 LEVELS
Anesthesia: General | Site: Back

## 2017-12-28 MED ORDER — HYDROMORPHONE HCL 1 MG/ML IJ SOLN
0.5000 mg | INTRAMUSCULAR | Status: DC | PRN
  Administered 2017-12-28 – 2017-12-29 (×4): 0.5 mg via INTRAVENOUS
  Filled 2017-12-28 (×4): qty 0.5

## 2017-12-28 MED ORDER — ONDANSETRON HCL 4 MG/2ML IJ SOLN: 4.0000 mg | Freq: Once | INTRAMUSCULAR | Status: DC | PRN

## 2017-12-28 MED ORDER — ROCURONIUM BROMIDE 50 MG/5ML IV SOSY
PREFILLED_SYRINGE | INTRAVENOUS | Status: AC
  Filled 2017-12-28: qty 5

## 2017-12-28 MED ORDER — SODIUM CHLORIDE 0.9 % IV SOLN
INTRAVENOUS | Status: DC
  Administered 2017-12-28: 15:00:00 via INTRAVENOUS

## 2017-12-28 MED ORDER — FENTANYL CITRATE (PF) 100 MCG/2ML IJ SOLN
INTRAMUSCULAR | Status: AC
  Administered 2017-12-28: 50 ug via INTRAVENOUS
  Filled 2017-12-28: qty 2

## 2017-12-28 MED ORDER — MIDAZOLAM HCL 5 MG/5ML IJ SOLN
INTRAMUSCULAR | Status: DC | PRN
  Administered 2017-12-28: 2 mg via INTRAVENOUS

## 2017-12-28 MED ORDER — BUPIVACAINE HCL (PF) 0.25 % IJ SOLN
INTRAMUSCULAR | Status: AC
  Filled 2017-12-28: qty 30

## 2017-12-28 MED ORDER — DEXMEDETOMIDINE HCL IN NACL 200 MCG/50ML IV SOLN
INTRAVENOUS | Status: DC | PRN
  Administered 2017-12-28 (×2): 8 ug via INTRAVENOUS
  Administered 2017-12-28: 4 ug via INTRAVENOUS

## 2017-12-28 MED ORDER — FENTANYL CITRATE (PF) 100 MCG/2ML IJ SOLN
INTRAMUSCULAR | Status: AC
  Filled 2017-12-28: qty 2

## 2017-12-28 MED ORDER — ROCURONIUM BROMIDE 50 MG/5ML IV SOSY
PREFILLED_SYRINGE | INTRAVENOUS | Status: DC | PRN
  Administered 2017-12-28: 20 mg via INTRAVENOUS
  Administered 2017-12-28: 50 mg via INTRAVENOUS

## 2017-12-28 MED ORDER — CHLORHEXIDINE GLUCONATE CLOTH 2 % EX PADS: 6.0000 | MEDICATED_PAD | Freq: Once | CUTANEOUS | Status: DC

## 2017-12-28 MED ORDER — TRIAMTERENE-HCTZ 37.5-25 MG PO TABS
1.0000 | ORAL_TABLET | Freq: Every day | ORAL | Status: DC
  Administered 2017-12-29: 1 via ORAL
  Filled 2017-12-28 (×2): qty 1

## 2017-12-28 MED ORDER — CEFAZOLIN SODIUM-DEXTROSE 2-4 GM/100ML-% IV SOLN
2.0000 g | Freq: Three times a day (TID) | INTRAVENOUS | Status: AC
  Administered 2017-12-28 – 2017-12-29 (×2): 2 g via INTRAVENOUS
  Filled 2017-12-28 (×2): qty 100

## 2017-12-28 MED ORDER — ARTIFICIAL TEARS OPHTHALMIC OINT
TOPICAL_OINTMENT | OPHTHALMIC | Status: AC
  Filled 2017-12-28: qty 3.5

## 2017-12-28 MED ORDER — LABETALOL HCL 5 MG/ML IV SOLN
INTRAVENOUS | Status: AC
  Administered 2017-12-28: 5 mg via INTRAVENOUS
  Filled 2017-12-28: qty 4

## 2017-12-28 MED ORDER — LIDOCAINE 2% (20 MG/ML) 5 ML SYRINGE
INTRAMUSCULAR | Status: AC
  Filled 2017-12-28: qty 5

## 2017-12-28 MED ORDER — OXYCODONE HCL 5 MG PO TABS
10.0000 mg | ORAL_TABLET | ORAL | Status: DC | PRN
  Administered 2017-12-29: 10 mg via ORAL
  Filled 2017-12-28: qty 2

## 2017-12-28 MED ORDER — MIDAZOLAM HCL 2 MG/2ML IJ SOLN
INTRAMUSCULAR | Status: AC
  Administered 2017-12-28: 1 mg via INTRAVENOUS
  Filled 2017-12-28: qty 2

## 2017-12-28 MED ORDER — LABETALOL HCL 5 MG/ML IV SOLN
5.0000 mg | INTRAVENOUS | Status: DC | PRN
  Administered 2017-12-28 (×3): 5 mg via INTRAVENOUS

## 2017-12-28 MED ORDER — SUCCINYLCHOLINE CHLORIDE 20 MG/ML IJ SOLN
INTRAMUSCULAR | Status: DC | PRN
  Administered 2017-12-28: 180 mg via INTRAVENOUS

## 2017-12-28 MED ORDER — FENTANYL CITRATE (PF) 250 MCG/5ML IJ SOLN
INTRAMUSCULAR | Status: AC
  Filled 2017-12-28: qty 5

## 2017-12-28 MED ORDER — LIDOCAINE-EPINEPHRINE 1 %-1:100000 IJ SOLN
INTRAMUSCULAR | Status: DC | PRN
  Administered 2017-12-28: 10 mL

## 2017-12-28 MED ORDER — SENNA 8.6 MG PO TABS
1.0000 | ORAL_TABLET | Freq: Two times a day (BID) | ORAL | Status: DC
  Administered 2017-12-28 – 2017-12-29 (×2): 8.6 mg via ORAL
  Filled 2017-12-28 (×2): qty 1

## 2017-12-28 MED ORDER — ROCURONIUM BROMIDE 50 MG/5ML IV SOSY
PREFILLED_SYRINGE | INTRAVENOUS | Status: AC
  Filled 2017-12-28: qty 10

## 2017-12-28 MED ORDER — PANTOPRAZOLE SODIUM 40 MG PO TBEC
40.0000 mg | DELAYED_RELEASE_TABLET | Freq: Every day | ORAL | Status: DC
  Administered 2017-12-29: 40 mg via ORAL
  Filled 2017-12-28: qty 1

## 2017-12-28 MED ORDER — THROMBIN 5000 UNITS EX SOLR
CUTANEOUS | Status: AC
  Filled 2017-12-28: qty 10000

## 2017-12-28 MED ORDER — DOCUSATE SODIUM 100 MG PO CAPS
100.0000 mg | ORAL_CAPSULE | Freq: Two times a day (BID) | ORAL | Status: DC
  Administered 2017-12-28 – 2017-12-29 (×2): 100 mg via ORAL
  Filled 2017-12-28 (×2): qty 1

## 2017-12-28 MED ORDER — OXYCODONE HCL 5 MG PO TABS
ORAL_TABLET | ORAL | Status: AC
  Filled 2017-12-28: qty 1

## 2017-12-28 MED ORDER — DEXTROSE 5 % IV SOLN
3.0000 g | INTRAVENOUS | Status: AC
  Administered 2017-12-28: 3 g via INTRAVENOUS
  Filled 2017-12-28: qty 3

## 2017-12-28 MED ORDER — PROPOFOL 10 MG/ML IV BOLUS
INTRAVENOUS | Status: DC | PRN
  Administered 2017-12-28 (×2): 200 mg via INTRAVENOUS

## 2017-12-28 MED ORDER — FENTANYL CITRATE (PF) 100 MCG/2ML IJ SOLN
25.0000 ug | INTRAMUSCULAR | Status: DC | PRN
  Administered 2017-12-28 (×3): 50 ug via INTRAVENOUS

## 2017-12-28 MED ORDER — ONDANSETRON HCL 4 MG/2ML IJ SOLN
INTRAMUSCULAR | Status: DC | PRN
  Administered 2017-12-28: 4 mg via INTRAVENOUS

## 2017-12-28 MED ORDER — SODIUM CHLORIDE 0.9% FLUSH: 3.0000 mL | Freq: Two times a day (BID) | INTRAVENOUS | Status: DC

## 2017-12-28 MED ORDER — DEXAMETHASONE SODIUM PHOSPHATE 10 MG/ML IJ SOLN
10.0000 mg | INTRAMUSCULAR | Status: AC
  Administered 2017-12-28: 10 mg via INTRAVENOUS

## 2017-12-28 MED ORDER — OXYCODONE HCL 5 MG/5ML PO SOLN: 5.0000 mg | Freq: Once | ORAL | Status: AC | PRN

## 2017-12-28 MED ORDER — CYCLOBENZAPRINE HCL 10 MG PO TABS
10.0000 mg | ORAL_TABLET | Freq: Three times a day (TID) | ORAL | Status: DC | PRN
  Administered 2017-12-28 – 2017-12-29 (×3): 10 mg via ORAL
  Filled 2017-12-28 (×3): qty 1

## 2017-12-28 MED ORDER — LIDOCAINE 2% (20 MG/ML) 5 ML SYRINGE
INTRAMUSCULAR | Status: DC | PRN
  Administered 2017-12-28: 50 mg via INTRAVENOUS

## 2017-12-28 MED ORDER — LACTATED RINGERS IV SOLN
INTRAVENOUS | Status: DC
  Administered 2017-12-28 (×2): via INTRAVENOUS

## 2017-12-28 MED ORDER — ALBUTEROL SULFATE HFA 108 (90 BASE) MCG/ACT IN AERS
INHALATION_SPRAY | RESPIRATORY_TRACT | Status: DC | PRN
  Administered 2017-12-28 (×2): 8 via RESPIRATORY_TRACT

## 2017-12-28 MED ORDER — FENTANYL CITRATE (PF) 100 MCG/2ML IJ SOLN
INTRAMUSCULAR | Status: DC | PRN
  Administered 2017-12-28: 100 ug via INTRAVENOUS
  Administered 2017-12-28: 50 ug via INTRAVENOUS
  Administered 2017-12-28: 100 ug via INTRAVENOUS
  Administered 2017-12-28: 50 ug via INTRAVENOUS

## 2017-12-28 MED ORDER — DEXAMETHASONE SODIUM PHOSPHATE 10 MG/ML IJ SOLN
INTRAMUSCULAR | Status: AC
  Filled 2017-12-28: qty 1

## 2017-12-28 MED ORDER — HYDROCODONE-ACETAMINOPHEN 5-325 MG PO TABS
1.0000 | ORAL_TABLET | ORAL | Status: DC | PRN
  Administered 2017-12-28 – 2017-12-29 (×4): 2 via ORAL
  Filled 2017-12-28 (×4): qty 2

## 2017-12-28 MED ORDER — OXYCODONE HCL 5 MG PO TABS
5.0000 mg | ORAL_TABLET | Freq: Once | ORAL | Status: AC | PRN
  Administered 2017-12-28: 5 mg via ORAL

## 2017-12-28 MED ORDER — ONDANSETRON 4 MG PO TBDP
4.0000 mg | ORAL_TABLET | Freq: Three times a day (TID) | ORAL | Status: DC | PRN
  Filled 2017-12-28: qty 1

## 2017-12-28 MED ORDER — BUPIVACAINE HCL (PF) 0.25 % IJ SOLN
INTRAMUSCULAR | Status: DC | PRN
  Administered 2017-12-28: 10 mL

## 2017-12-28 MED ORDER — ACETAMINOPHEN 650 MG RE SUPP: 650.0000 mg | RECTAL | Status: DC | PRN

## 2017-12-28 MED ORDER — MIDAZOLAM HCL 2 MG/2ML IJ SOLN
1.0000 mg | Freq: Once | INTRAMUSCULAR | Status: AC
  Administered 2017-12-28: 1 mg via INTRAVENOUS

## 2017-12-28 MED ORDER — SODIUM CHLORIDE 0.9 % IV SOLN: 250.0000 mL | INTRAVENOUS | Status: DC

## 2017-12-28 MED ORDER — MENTHOL 3 MG MT LOZG: 1.0000 | LOZENGE | OROMUCOSAL | Status: DC | PRN

## 2017-12-28 MED ORDER — 0.9 % SODIUM CHLORIDE (POUR BTL) OPTIME
TOPICAL | Status: DC | PRN
  Administered 2017-12-28: 1000 mL

## 2017-12-28 MED ORDER — CEFAZOLIN SODIUM-DEXTROSE 2-4 GM/100ML-% IV SOLN
INTRAVENOUS | Status: AC
  Filled 2017-12-28: qty 100

## 2017-12-28 MED ORDER — FENTANYL CITRATE (PF) 100 MCG/2ML IJ SOLN
50.0000 ug | Freq: Once | INTRAMUSCULAR | Status: AC
  Administered 2017-12-28 (×2): 50 ug via INTRAVENOUS

## 2017-12-28 MED ORDER — HEMOSTATIC AGENTS (NO CHARGE) OPTIME
TOPICAL | Status: DC | PRN
  Administered 2017-12-28: 1 via TOPICAL

## 2017-12-28 MED ORDER — LIDOCAINE-EPINEPHRINE 1 %-1:100000 IJ SOLN
INTRAMUSCULAR | Status: AC
  Filled 2017-12-28: qty 1

## 2017-12-28 MED ORDER — ONDANSETRON HCL 4 MG/2ML IJ SOLN: 4.0000 mg | Freq: Four times a day (QID) | INTRAMUSCULAR | Status: DC | PRN

## 2017-12-28 MED ORDER — PROPOFOL 10 MG/ML IV BOLUS
INTRAVENOUS | Status: AC
  Filled 2017-12-28: qty 20

## 2017-12-28 MED ORDER — SODIUM CHLORIDE 0.9% FLUSH: 3.0000 mL | INTRAVENOUS | Status: DC | PRN

## 2017-12-28 MED ORDER — ACETAMINOPHEN 325 MG PO TABS: 650.0000 mg | ORAL_TABLET | ORAL | Status: DC | PRN

## 2017-12-28 MED ORDER — THROMBIN 5000 UNITS EX SOLR
CUTANEOUS | Status: DC | PRN
  Administered 2017-12-28 (×2): 5000 [IU] via TOPICAL

## 2017-12-28 MED ORDER — SODIUM CHLORIDE 0.9 % IV SOLN
INTRAVENOUS | Status: DC | PRN
  Administered 2017-12-28: 11:00:00

## 2017-12-28 MED ORDER — PHENOL 1.4 % MT LIQD: 1.0000 | OROMUCOSAL | Status: DC | PRN

## 2017-12-28 MED ORDER — HYDROCODONE-ACETAMINOPHEN 5-325 MG PO TABS: 1.0000 | ORAL_TABLET | ORAL | Status: DC | PRN

## 2017-12-28 MED ORDER — SUGAMMADEX SODIUM 500 MG/5ML IV SOLN
INTRAVENOUS | Status: DC | PRN
  Administered 2017-12-28: 500 mg via INTRAVENOUS

## 2017-12-28 MED ORDER — MIDAZOLAM HCL 2 MG/2ML IJ SOLN
INTRAMUSCULAR | Status: AC
  Filled 2017-12-28: qty 2

## 2017-12-28 MED ORDER — ONDANSETRON HCL 4 MG PO TABS: 4.0000 mg | ORAL_TABLET | Freq: Four times a day (QID) | ORAL | Status: DC | PRN

## 2017-12-28 MED ORDER — ONDANSETRON HCL 4 MG/2ML IJ SOLN
INTRAMUSCULAR | Status: AC
  Filled 2017-12-28: qty 2

## 2017-12-28 SURGICAL SUPPLY — 60 items
BAG DECANTER FOR FLEXI CONT (MISCELLANEOUS) ×3 IMPLANT
BENZOIN TINCTURE PRP APPL 2/3 (GAUZE/BANDAGES/DRESSINGS) ×3 IMPLANT
BLADE CLIPPER SURG (BLADE) IMPLANT
BLADE SURG 11 STRL SS (BLADE) ×3 IMPLANT
BUR CUTTER 7.0 ROUND (BURR) ×3 IMPLANT
BUR MATCHSTICK NEURO 3.0 LAGG (BURR) ×3 IMPLANT
CANISTER SUCT 3000ML PPV (MISCELLANEOUS) ×3 IMPLANT
CARTRIDGE OIL MAESTRO DRILL (MISCELLANEOUS) ×1 IMPLANT
CLOSURE WOUND 1/2 X4 (GAUZE/BANDAGES/DRESSINGS) ×1
DECANTER SPIKE VIAL GLASS SM (MISCELLANEOUS) ×3 IMPLANT
DERMABOND ADVANCED (GAUZE/BANDAGES/DRESSINGS) ×2
DERMABOND ADVANCED .7 DNX12 (GAUZE/BANDAGES/DRESSINGS) ×1 IMPLANT
DIFFUSER DRILL AIR PNEUMATIC (MISCELLANEOUS) ×3 IMPLANT
DRAPE HALF SHEET 40X57 (DRAPES) IMPLANT
DRAPE LAPAROTOMY 100X72X124 (DRAPES) ×3 IMPLANT
DRAPE MICROSCOPE LEICA (MISCELLANEOUS) ×3 IMPLANT
DRAPE SURG 17X23 STRL (DRAPES) ×3 IMPLANT
DRSG OPSITE 4X5.5 SM (GAUZE/BANDAGES/DRESSINGS) ×3 IMPLANT
DRSG OPSITE POSTOP 4X8 (GAUZE/BANDAGES/DRESSINGS) ×3 IMPLANT
DURAPREP 26ML APPLICATOR (WOUND CARE) ×3 IMPLANT
ELECT BLADE 4.0 EZ CLEAN MEGAD (MISCELLANEOUS) ×3
ELECT REM PT RETURN 9FT ADLT (ELECTROSURGICAL) ×3
ELECTRODE BLDE 4.0 EZ CLN MEGD (MISCELLANEOUS) ×1 IMPLANT
ELECTRODE REM PT RTRN 9FT ADLT (ELECTROSURGICAL) ×1 IMPLANT
EVACUATOR 3/16  PVC DRAIN (DRAIN) ×2
EVACUATOR 3/16 PVC DRAIN (DRAIN) ×1 IMPLANT
GAUZE 4X4 16PLY RFD (DISPOSABLE) IMPLANT
GAUZE SPONGE 4X4 12PLY STRL (GAUZE/BANDAGES/DRESSINGS) ×3 IMPLANT
GLOVE BIO SURGEON STRL SZ7 (GLOVE) ×3 IMPLANT
GLOVE BIO SURGEON STRL SZ8 (GLOVE) ×6 IMPLANT
GLOVE BIO SURGEON STRL SZ8.5 (GLOVE) ×3 IMPLANT
GLOVE BIOGEL PI IND STRL 7.0 (GLOVE) IMPLANT
GLOVE BIOGEL PI INDICATOR 7.0 (GLOVE)
GLOVE ECLIPSE 7.5 STRL STRAW (GLOVE) IMPLANT
GLOVE EXAM NITRILE LRG STRL (GLOVE) IMPLANT
GLOVE EXAM NITRILE XL STR (GLOVE) IMPLANT
GLOVE EXAM NITRILE XS STR PU (GLOVE) IMPLANT
GLOVE INDICATOR 8.5 STRL (GLOVE) ×3 IMPLANT
GOWN STRL REUS W/ TWL LRG LVL3 (GOWN DISPOSABLE) ×1 IMPLANT
GOWN STRL REUS W/ TWL XL LVL3 (GOWN DISPOSABLE) ×2 IMPLANT
GOWN STRL REUS W/TWL 2XL LVL3 (GOWN DISPOSABLE) IMPLANT
GOWN STRL REUS W/TWL LRG LVL3 (GOWN DISPOSABLE) ×2
GOWN STRL REUS W/TWL XL LVL3 (GOWN DISPOSABLE) ×4
KIT BASIN OR (CUSTOM PROCEDURE TRAY) ×3 IMPLANT
KIT TURNOVER KIT B (KITS) ×3 IMPLANT
NEEDLE HYPO 22GX1.5 SAFETY (NEEDLE) ×3 IMPLANT
NEEDLE SPNL 22GX3.5 QUINCKE BK (NEEDLE) ×3 IMPLANT
NS IRRIG 1000ML POUR BTL (IV SOLUTION) ×3 IMPLANT
OIL CARTRIDGE MAESTRO DRILL (MISCELLANEOUS) ×3
PACK LAMINECTOMY NEURO (CUSTOM PROCEDURE TRAY) ×3 IMPLANT
RUBBERBAND STERILE (MISCELLANEOUS) ×6 IMPLANT
SPONGE SURGIFOAM ABS GEL SZ50 (HEMOSTASIS) ×3 IMPLANT
STRIP CLOSURE SKIN 1/2X4 (GAUZE/BANDAGES/DRESSINGS) ×2 IMPLANT
SUT VIC AB 0 CT1 18XCR BRD8 (SUTURE) ×1 IMPLANT
SUT VIC AB 0 CT1 8-18 (SUTURE) ×2
SUT VIC AB 2-0 CT1 18 (SUTURE) ×3 IMPLANT
SUT VIC AB 4-0 PS2 27 (SUTURE) ×3 IMPLANT
TOWEL GREEN STERILE (TOWEL DISPOSABLE) ×3 IMPLANT
TOWEL GREEN STERILE FF (TOWEL DISPOSABLE) ×3 IMPLANT
WATER STERILE IRR 1000ML POUR (IV SOLUTION) ×3 IMPLANT

## 2017-12-28 NOTE — Anesthesia Preprocedure Evaluation (Signed)
Anesthesia Evaluation  Patient identified by MRN, date of birth, ID band Patient awake    Reviewed: Allergy & Precautions, NPO status , Patient's Chart, lab work & pertinent test results  Airway Mallampati: III  TM Distance: >3 FB Neck ROM: Full    Dental  (+) Teeth Intact, Dental Advisory Given   Pulmonary Current Smoker,    breath sounds clear to auscultation       Cardiovascular hypertension,  Rhythm:Regular Rate:Normal     Neuro/Psych    GI/Hepatic   Endo/Other    Renal/GU      Musculoskeletal   Abdominal (+) + obese,   Peds  Hematology   Anesthesia Other Findings   Reproductive/Obstetrics                             Anesthesia Physical Anesthesia Plan  ASA: III  Anesthesia Plan: General   Post-op Pain Management:    Induction: Intravenous  PONV Risk Score and Plan: Ondansetron and Dexamethasone  Airway Management Planned: Oral ETT  Additional Equipment:   Intra-op Plan:   Post-operative Plan: Extubation in OR  Informed Consent: I have reviewed the patients History and Physical, chart, labs and discussed the procedure including the risks, benefits and alternatives for the proposed anesthesia with the patient or authorized representative who has indicated his/her understanding and acceptance.   Dental advisory given  Plan Discussed with: CRNA and Anesthesiologist  Anesthesia Plan Comments:         Anesthesia Quick Evaluation

## 2017-12-28 NOTE — H&P (Signed)
Ian Case is an 57 y.o. male.   Chief Complaint: Back bilateral leg pain with neurogenic claudication HPI: 57 year old gentleman with severe leg pain neurogenic claudication difficulty walking.  Leg pain is much greater than his back pain.  Work-up revealed severe spinal stenosis L2-3, L3-4, L4-5.  Due to patient progression of clinical syndrome imaging findings and failed conservative treatment I recommended decompressive laminectomy at those 3 levels.  I have extensively gone over the risks and benefits of the operation with him as well as perioperative course expectations of outcome and alternatives of surgery and he understands and agrees to proceed forward.  Past Medical History:  Diagnosis Date  . Arthritis   . Back pain   . GERD (gastroesophageal reflux disease)   . History of kidney stones   . Hypertension   . Sleep apnea    does not have cpap yet    Past Surgical History:  Procedure Laterality Date  . APPENDECTOMY    . HAND SURGERY      Family History  Problem Relation Age of Onset  . Thyroid disease Mother   . Heart disease Father   . Healthy Daughter   . Healthy Son    Social History:  reports that he has been smoking. He has been smoking about 1.50 packs per day. He has never used smokeless tobacco. He reports that he drinks alcohol. He reports that he does not use drugs.  Allergies: No Known Allergies  Medications Prior to Admission  Medication Sig Dispense Refill  . Aspirin-Acetaminophen-Caffeine (GOODYS EXTRA STRENGTH) 500-325-65 MG PACK Take 1 packet by mouth 2 (two) times daily as needed (for pain.).     Marland Kitchen ibuprofen (ADVIL,MOTRIN) 200 MG tablet Take 400 mg by mouth every 8 (eight) hours as needed (for pain.).    Marland Kitchen omeprazole (PRILOSEC) 20 MG capsule Take 20 mg by mouth daily.    . ondansetron (ZOFRAN ODT) 4 MG disintegrating tablet Take 1 tablet (4 mg total) by mouth every 8 (eight) hours as needed for nausea or vomiting. 20 tablet 0  .  oxyCODONE-acetaminophen (PERCOCET/ROXICET) 5-325 MG tablet Take 1-2 tablets by mouth every 4 (four) hours as needed for severe pain. 15 tablet 0  . tiZANidine (ZANAFLEX) 4 MG tablet Take 1 tablet (4 mg total) by mouth every 8 (eight) hours as needed for muscle spasms. 90 tablet 5  . triamterene-hydrochlorothiazide (MAXZIDE-25) 37.5-25 MG tablet Take 1 tablet by mouth daily.    . nortriptyline (PAMELOR) 10 MG capsule Take 1 cap every night for 1 week, then increase to 2 caps every night for 2 weeks, then increase to 3 caps every night (Patient not taking: Reported on 12/15/2017) 90 capsule 11    No results found for this or any previous visit (from the past 48 hour(s)). No results found.  Review of Systems  Musculoskeletal: Positive for back pain.  Neurological: Positive for tingling and sensory change.    Blood pressure (!) 178/85, pulse 97, temperature 98.7 F (37.1 C), temperature source Oral, resp. rate (!) 8, SpO2 100 %. Physical Exam  Constitutional: He is oriented to person, place, and time. He appears well-developed.  HENT:  Head: Normocephalic.  Eyes: Pupils are equal, round, and reactive to light.  Neck: Normal range of motion.  Respiratory: Effort normal.  GI: Soft.  Musculoskeletal: Normal range of motion.  Neurological: He is alert and oriented to person, place, and time. He has normal strength. GCS eye subscore is 4. GCS verbal subscore is 5. GCS motor subscore  is 6.  Strength is 5 out of 5 iliopsoas, quads, hamstrings, gastroc, and tibialis, EHL.     Assessment/Plan 57 year old gentleman presents for decompressive laminectomies L2-3, L3-4, L4-5.  Pang Robers P, MD 12/28/2017, 10:28 AM

## 2017-12-28 NOTE — Anesthesia Procedure Notes (Signed)
Procedure Name: Intubation Date/Time: 12/28/2017 11:05 AM Performed by: Candis Shine, CRNA Pre-anesthesia Checklist: Patient identified, Emergency Drugs available, Suction available and Patient being monitored Patient Re-evaluated:Patient Re-evaluated prior to induction Oxygen Delivery Method: Circle System Utilized Preoxygenation: Pre-oxygenation with 100% oxygen Induction Type: IV induction Ventilation: Mask ventilation with difficulty, Two handed mask ventilation required and Oral airway inserted - appropriate to patient size Laryngoscope Size: Sabra Heck and 3 Grade View: Grade II Tube type: Oral Tube size: 7.5 mm Number of attempts: 2 Airway Equipment and Method: Stylet and Oral airway Placement Confirmation: ETT inserted through vocal cords under direct vision,  positive ETCO2 and breath sounds checked- equal and bilateral Secured at: 22 cm Tube secured with: Tape Dental Injury: Teeth and Oropharynx as per pre-operative assessment  Future Recommendations: Recommend- induction with short-acting agent, and alternative techniques readily available Comments: DL x 1 by EMT student, unable to pass ETT. DL x 1 by Dr. Linna Caprice, successful atraumatic intubation.

## 2017-12-28 NOTE — Anesthesia Postprocedure Evaluation (Signed)
Anesthesia Post Note  Patient: Klark Vanderhoef  Procedure(s) Performed: Laminectomy and Foraminotomy Lumbar two-three-Lumbar three-four - Lumbar four-five (N/A Back)     Patient location during evaluation: PACU Anesthesia Type: General Level of consciousness: awake and alert Pain management: pain level controlled Vital Signs Assessment: post-procedure vital signs reviewed and stable Respiratory status: spontaneous breathing, nonlabored ventilation, respiratory function stable and patient connected to nasal cannula oxygen Cardiovascular status: blood pressure returned to baseline and stable Postop Assessment: no apparent nausea or vomiting Anesthetic complications: no    Last Vitals:  Vitals:   12/28/17 1410 12/28/17 1936  BP: (!) 173/114 (!) 127/92  Pulse: 89 95  Resp: 18 20  Temp: 36.4 C 36.6 C  SpO2: 99% 98%    Last Pain:  Vitals:   12/28/17 2159  TempSrc:   PainSc: 2                  Lincoln Ginley COKER

## 2017-12-28 NOTE — Transfer of Care (Signed)
Immediate Anesthesia Transfer of Care Note  Patient: Ian Case  Procedure(s) Performed: Laminectomy and Foraminotomy Lumbar two-three-Lumbar three-four - Lumbar four-five (N/A Back)  Patient Location: PACU  Anesthesia Type:General  Level of Consciousness: awake, alert  and oriented  Airway & Oxygen Therapy: Patient Spontanous Breathing and Patient connected to nasal cannula oxygen  Post-op Assessment: Report given to RN and Post -op Vital signs reviewed and stable  Post vital signs: Reviewed and stable  Last Vitals:  Vitals Value Taken Time  BP 181/102 12/28/2017 12:42 PM  Temp    Pulse 98 12/28/2017 12:44 PM  Resp 19 12/28/2017 12:44 PM  SpO2 96 % 12/28/2017 12:44 PM  Vitals shown include unvalidated device data.  Last Pain:  Vitals:   12/28/17 0910  TempSrc:   PainSc: 7       Patients Stated Pain Goal: 5 (01/10/11 1975)  Complications: No apparent anesthesia complications

## 2017-12-28 NOTE — Op Note (Signed)
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  Due to patient's progression of clinical syndrome imaging findings and failed conservative treatment I recommended decompressive laminectomies of L2-3, L3-4, L4-5.  I have extensively gone over the risks and benefits of the operation with him as well as perioperative course expectations of outcome and alternatives of surgery and he understands and agrees to proceed forward.  Operative procedure: Patient brought to the operating room and induced under t general anesthesia positioned prone Wilson frame his back was prepped and draped in routine sterile fashion reoperative x-ray localized the appropriate level so after infiltration of 10 cc lidocaine with epi midline incision was made and Bovie elect cautery was used was used to take down the subcutaneous tissue and subperiosteal dissection was carried out on the lamina of L2, L3, L4, L5.  Intraoperative x-ray confirmed medication appropriate level.  Then the spinous process of L3 and L4 remove the inferior aspect of 2 and supravascular 5 central decompression was begun.  An extensive amount of epidural fat was identified facets were markedly hypertrophied and crating hourglass compression thecal sac.  So after central decompression was begun I aggressively under but the medial gutter identified the pedicles of L3, L4, L5 decompress the  nerve roots and unroofed the foramen of L3, L4, L5.  At the end decompression there is no further stenosis either centrally or foraminally wound scopes irrigated to Kassim states was maintained Gelfoam was awake top of the dura and the muscle and fascia were reapproximated in layers after placement of a large Hemovac drain Marcaine was injected in the fascia and the skin was closed running 4 subcuticular Dermabond benzoin Steri-Strips and a sterile dressing was applied patient recovery room in stable condition.  At the end the case on the account sponge counts were correct.

## 2017-12-29 ENCOUNTER — Other Ambulatory Visit: Payer: Self-pay

## 2017-12-29 ENCOUNTER — Encounter (HOSPITAL_COMMUNITY): Payer: Self-pay | Admitting: Neurosurgery

## 2017-12-29 DIAGNOSIS — G4730 Sleep apnea, unspecified: Secondary | ICD-10-CM | POA: Diagnosis present

## 2017-12-29 DIAGNOSIS — Z6841 Body Mass Index (BMI) 40.0 and over, adult: Secondary | ICD-10-CM | POA: Diagnosis not present

## 2017-12-29 DIAGNOSIS — I10 Essential (primary) hypertension: Secondary | ICD-10-CM | POA: Diagnosis present

## 2017-12-29 DIAGNOSIS — M48061 Spinal stenosis, lumbar region without neurogenic claudication: Secondary | ICD-10-CM | POA: Diagnosis present

## 2017-12-29 DIAGNOSIS — K219 Gastro-esophageal reflux disease without esophagitis: Secondary | ICD-10-CM | POA: Diagnosis present

## 2017-12-29 DIAGNOSIS — F17210 Nicotine dependence, cigarettes, uncomplicated: Secondary | ICD-10-CM | POA: Diagnosis present

## 2017-12-29 DIAGNOSIS — Z79899 Other long term (current) drug therapy: Secondary | ICD-10-CM | POA: Diagnosis not present

## 2017-12-29 DIAGNOSIS — E669 Obesity, unspecified: Secondary | ICD-10-CM | POA: Diagnosis present

## 2017-12-29 MED ORDER — TIZANIDINE HCL 4 MG PO TABS: 4.0000 mg | ORAL_TABLET | Freq: Three times a day (TID) | ORAL | 1 refills | Status: AC

## 2017-12-29 MED ORDER — HYDROCODONE-ACETAMINOPHEN 5-325 MG PO TABS: 1.0000 | ORAL_TABLET | ORAL | 0 refills | Status: DC | PRN

## 2017-12-29 NOTE — Discharge Instructions (Signed)

## 2017-12-29 NOTE — Evaluation (Signed)
Occupational Therapy Evaluation Patient Details Name: Ian Case MRN: [id number] DOB: [birth year] Today's Date: 12/29/2017    History of Present Illness Pt is a 57 y/o male now s/p lumbar laminectomy L2-3, L3-4, L4-5. PMHx includes HTN, sleep apnea   Clinical Impression   This 57 y/o male presents with the above. At baseline pt reports he was using crutches for functional mobility due to LE pain, was receiving assist from spouse for bathing/dressing ADLs. Pt demonstrates functional mobility this session without AD and minguard assist throughout. He currently requires setup/supervision for UB ADL, modA for LB ADLs secondary to adhering to back precautions. Educated pt/pt's spouse regarding back precautions, safety and compensatory techniques for completing ADLs and functional transfers while maintaining precautions with pt/pt's spouse verbalizing understanding. Spouse present during session and reports she will assist with ADLs PRN after return home. Questions answered throughout. Feel pt is safe to return home from OT standpoint once medically ready given available family assist. No further acute OT needs identified at this time, will sign off. Thank you for this referral.     Follow Up Recommendations  No OT follow up;Supervision/Assistance - 24 hour(24hr initially )    Equipment Recommendations  None recommended by OT           Precautions / Restrictions Precautions Precautions: Fall;Back Precaution Booklet Issued: Yes (comment) Precaution Comments: issued and reviewed with pt/pt's spouse  Restrictions Weight Bearing Restrictions: No Other Position/Activity Restrictions: per order set "no brace needed"      Mobility Bed Mobility               General bed mobility comments: seated EOB upon arrival; reviewed log roll techniques with pt/pt's spouse  Transfers Overall transfer level: Needs assistance Equipment used: None Transfers: Sit to/from Stand Sit to Stand:  Supervision         General transfer comment: for safety and standing balance upon initial sit<>stand; no LOB or physical assist required     Balance Overall balance assessment: Needs assistance Sitting-balance support: Feet supported Sitting balance-Leahy Scale: Good     Standing balance support: No upper extremity supported;During functional activity Standing balance-Leahy Scale: Fair                             ADL either performed or assessed with clinical judgement   ADL Overall ADL's : Needs assistance/impaired Eating/Feeding: Independent;Sitting   Grooming: Min guard;Standing   Upper Body Bathing: Min guard;Standing;Sitting   Lower Body Bathing: Minimal assistance;Sit to/from stand   Upper Body Dressing : Sitting;Set up   Lower Body Dressing: Moderate assistance;Sit to/from stand Lower Body Dressing Details (indicate cue type and reason): to access LEs due to back precautions, spouse reports she will assist with LB tasks; minguard for standing balance Toilet Transfer: Min guard;Ambulation;Comfort height toilet Toilet Transfer Details (indicate cue type and reason): simulated in transfer to/from EOB Toileting- Clothing Manipulation and Hygiene: Min guard;Sit to/from Nurse, children's Details (indicate cue type and reason): verbally reviewed safe transfer techniques Functional mobility during ADLs: Min guard;Supervision/safety General ADL Comments: reviewed and educated pt/pt's spouse regarding back precautions, safety and compensatory techniques for completing ADLs and functional transfers while maintaining precautions; pt mostly limited due to LE pain                          Pertinent Vitals/Pain Pain Assessment: Faces Faces Pain Scale: Hurts little more Pain  Location: RLE Pain Descriptors / Indicators: Guarding;Sore Pain Intervention(s): Monitored during session;Limited activity within patient's tolerance     Hand Dominance      Extremity/Trunk Assessment Upper Extremity Assessment Upper Extremity Assessment: Overall WFL for tasks assessed   Lower Extremity Assessment Lower Extremity Assessment: Defer to PT evaluation   Cervical / Trunk Assessment Cervical / Trunk Assessment: Other exceptions Cervical / Trunk Exceptions: s/p lumbar surgery    Communication Communication Communication: No difficulties   Cognition Arousal/Alertness: Awake/alert Behavior During Therapy: WFL for tasks assessed/performed Overall Cognitive Status: Within Functional Limits for tasks assessed                                     General Comments       Exercises     Shoulder Instructions      Home Living Family/patient expects to be discharged to:: Private residence Living Arrangements: Spouse/significant other Available Help at Discharge: Family;Available 24 hours/day Type of Home: Mobile home Home Access: Ramped entrance     Home Layout: One level     Bathroom Shower/Tub: Teacher, early years/pre: Handicapped height     Home Equipment: [place] - single point;Crutches          Prior Functioning/Environment Level of Independence: Needs assistance  Gait / Transfers Assistance Needed: using crutches for ambulation due to LE pain ADL's / Homemaking Assistance Needed: reports spouse was assisting with bathing/dressing ADLs            OT Problem List: Impaired balance (sitting and/or standing);Decreased knowledge of precautions;Pain;Decreased activity tolerance      OT Treatment/Interventions:      OT Goals(Current goals can be found in the care plan section) Acute Rehab OT Goals Patient Stated Goal: return home today  OT Goal Formulation: All assessment and education complete, DC therapy  OT Frequency:     Barriers to D/C:            Co-evaluation              AM-PAC PT "6 Clicks" Daily Activity     Outcome Measure Help from another person eating meals?: None Help  from another person taking care of personal grooming?: None Help from another person toileting, which includes using toliet, bedpan, or urinal?: A Little Help from another person bathing (including washing, rinsing, drying)?: A Little Help from another person to put on and taking off regular upper body clothing?: None Help from another person to put on and taking off regular lower body clothing?: A Lot 6 Click Score: 20   End of Session Nurse Communication: Mobility status  Activity Tolerance: Patient tolerated treatment well Patient left: with call bell/phone within reach;with family/visitor present;Other (comment)(sitting EOB)  OT Visit Diagnosis: Other abnormalities of gait and mobility (R26.89);Pain Pain - Right/Left: Right Pain - part of body: Leg(back; RLE)                Time: 2563-8937 OT Time Calculation (min): 21 min Charges:  OT General Charges $OT Visit: 1 Visit OT Evaluation $OT Eval Low Complexity: 1 Low  Lou Cal, OT [facility] Services Pager [phone] Office [phone]   Raymondo Band 12/29/2017, 9:47 AM

## 2017-12-29 NOTE — Progress Notes (Signed)
Patient is discharged from room 3C03 at this time. Alert and in stable condition. IV site d/c'[d and instructions read to patient and spouse with understanding verbalized. Left unit via wheelchair with all belongings at side. 

## 2017-12-29 NOTE — Discharge Summary (Signed)
Physician Discharge Summary  Patient ID: Ian Case MRN: [id number] DOB/AGE: 11-Jun-1960 57 y.o.  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 The patient tolerated the procedure well and was taken to the recovery room and then to the floor in stable condition. The hospital course was routine. There were no complications. The wound remained clean dry and intact. Pt had appropriate back soreness. No complaints of leg pain or new N/T/W. The patient remained afebrile with stable vital signs, and tolerated a regular diet. The patient continued to increase activities, and pain was well controlled with oral pain medications.   Consults: None  Significant Diagnostic Studies:  Results for orders placed or performed during the hospital encounter of 12/25/17  CBC with Differential/Platelet  Result Value Ref Range   WBC 6.2 4.0 - 10.5 K/uL   RBC 5.35 4.22 - 5.81 MIL/uL   Hemoglobin 18.9 (H) 13.0 - 17.0 g/dL   HCT 52.5 (H) 39.0 - 52.0 %   MCV 98.1 78.0 - 100.0 fL   MCH 35.3 (H) 26.0 - 34.0 pg   MCHC 36.0 30.0 - 36.0 g/dL   RDW 13.4 11.5 - 15.5 %   Platelets 201 150 - 400 K/uL   Neutrophils Relative % 49 %   Neutro Abs 3.1 1.7 - 7.7 K/uL   Lymphocytes Relative 35 %   Lymphs Abs 2.2 0.7 - 4.0 K/uL   Monocytes Relative 12 %   Monocytes Absolute 0.7 0.1 - 1.0 K/uL   Eosinophils Relative 1 %   Eosinophils Absolute 0.0 0.0 - 0.7 K/uL   Basophils Relative 1 %   Basophils Absolute 0.0 0.0 - 0.1 K/uL   Immature Granulocytes 2 %   Abs Immature Granulocytes 0.1 0.0 - 0.1 K/uL  Basic metabolic panel  Result Value Ref Range   Sodium 130 (L) 135 - 145 mmol/L   Potassium 7.1 (HH) 3.5 -  5.1 mmol/L   Chloride 94 (L) 98 - 111 mmol/L   CO2 20 (L) 22 - 32 mmol/L   Glucose, Bld 92 70 - 99 mg/dL   BUN 18 6 - 20 mg/dL   Creatinine, Ser 0.78 0.61 - 1.24 mg/dL   Calcium 9.6 8.9 - 10.3 mg/dL   GFR calc non Af Amer >60 >60 mL/min   GFR calc Af Amer >60 >60 mL/min   Anion gap 16 (H) 5 - 15  Urinalysis, Routine w reflex microscopic  Result Value Ref Range   Color, Urine YELLOW YELLOW   APPearance HAZY (A) CLEAR   Specific Gravity, Urine 1.023 1.005 - 1.030   pH 5.0 5.0 - 8.0   Glucose, UA NEGATIVE NEGATIVE mg/dL   Hgb urine dipstick NEGATIVE NEGATIVE   Bilirubin Urine NEGATIVE NEGATIVE   Ketones, ur 20 (A) NEGATIVE mg/dL   Protein, ur NEGATIVE NEGATIVE mg/dL   Nitrite NEGATIVE NEGATIVE   Leukocytes, UA NEGATIVE NEGATIVE  I-Stat Chem 8, ED  Result Value Ref Range   Sodium 131 (L) 135 - 145 mmol/L   Potassium 4.2 3.5 - 5.1 mmol/L   Chloride 100 98 - 111 mmol/L   BUN 24 (H) 6 - 20 mg/dL   Creatinine, Ser 0.80 0.61 - 1.24 mg/dL   Glucose, Bld 97 70 - 99 mg/dL  Calcium, Ion 1.15 1.15 - 1.40 mmol/L   TCO2 22 22 - 32 mmol/L   Hemoglobin 18.7 (H) 13.0 - 17.0 g/dL   HCT 55.0 (H) 39.0 - 52.0 %    Dg Lumbar Spine 2-3 Views  Result Date: 12/28/2017 CLINICAL DATA:  Lumbar spine surgery, laminectomy and foraminotomy L2-L3, L3-L4, L4-L5 EXAM: LUMBAR SPINE - 2-3 VIEW COMPARISON:  MRI lumbar spine 12/10/2017 FINDINGS: Prior MRI labeled with 5 lumbar vertebra. Image 1 at 2993 hours: Metallic probe via dorsal approach projects dorsal to the mid L4 level. Image 2 at 1131 hours: Curved metallic probe via dorsal approach projects dorsal to the mid L4 level. IMPRESSION: Dorsal localization of the mid L4 level as above. Electronically Signed   By: Lavonia [name] M.D.   On: 12/28/2017 13:55   Mr Cervical Spine Wo Contrast  Result Date: 12/11/2017 CLINICAL DATA:  Neck pain.  Abnormal balance.  Difficulty walking. EXAM: MRI CERVICAL SPINE WITHOUT CONTRAST TECHNIQUE: Multiplanar, multisequence MR  imaging of the cervical spine was performed. No intravenous contrast was administered. COMPARISON:  None. FINDINGS: Alignment: AP alignment is anatomic. Vertebrae: Mild endplate marrow changes are present. Marrow signal vertebral body heights are otherwise normal. Cord: Normal signal is present in the cervical and upper thoracic spinal cord to the lowest imaged level, T2-3. Posterior Fossa, vertebral arteries, paraspinal tissues: Craniocervical junction is normal. The visualized intracranial contents are normal. Flow is present in vertebral arteries paraspinous soft tissues are within normal limits. Disc levels: C2-3: Asymmetric right-sided uncovertebral spurring leads to mild right foraminal narrowing. C3-4: A broad-based disc osteophyte complex partially effaces ventral CSF. Asymmetric right-sided facet hypertrophy is noted. This results in mild foraminal narrowing bilaterally. C4-5: A broad-based disc osteophyte complex is present. Uncovertebral spurring is worse on the right. Moderate right and mild left foraminal narrowing is present facet hypertrophy is present bilaterally, worse on the left. C5-6: Bilateral uncovertebral spurring is present, right greater than left. This results in moderate right and mild left foraminal narrowing. C6-7: Mild uncovertebral spurring is present bilaterally. There is mild right foraminal narrowing. C7-T1: Mild facet hypertrophy is present bilaterally without significant stenosis. IMPRESSION: 1. Uncovertebral spurring in the cervical spine results in right greater than left foraminal narrowing at C3-4, C4-5, and C5-6 as described. 2. Mild right foraminal narrowing is present at C2-3 and C6-7. 3. Partial effacement of ventral CSF at C3-4, right worse than left. No significant central canal stenosis is present. Electronically Signed   By: San Morelle M.D.   On: 12/11/2017 16:13   Mr Lumbar Spine Wo Contrast  Result Date: 12/11/2017 CLINICAL DATA:  Low back pain. Abnormal  balance. Difficulty walking. EXAM: MRI LUMBAR SPINE WITHOUT CONTRAST TECHNIQUE: Multiplanar, multisequence MR imaging of the lumbar spine was performed. No intravenous contrast was administered. COMPARISON:  MRI lumbar spine 06/02/2016 at [facility]. FINDINGS: Segmentation: 5 non rib-bearing lumbar type vertebral bodies are present. The lowest fully formed vertebral body is L5. Alignment: Slight retrolisthesis at L2-3, L3-4, and L4-5 is stable. Levoconvex curvature centered at L2-3 stable. Vertebrae: Chronic fatty endplate marrow changes are more pronounced right than left at L2-3 and L3-4 and are worse on the left at L4-5. No significant interval change. There is diffuse fatty infiltration of the marrow. Conus medullaris and cauda equina: Conus extends to the L1 level. Conus and cauda equina appear normal. Paraspinal and other soft tissues: Limited imaging of the abdomen is unremarkable. No significant adenopathy is present. Paraspinous musculature is within normal limits. Disc  levels: L1-2: Mild disc bulging and facet hypertrophy is stable without significant stenosis or change. L2-3: A broad-based disc protrusion is present. Moderate facet hypertrophy is noted bilaterally. Mild foraminal narrowing bilaterally is stable. The central canal is patent. L3-4: A broad-based disc protrusion is present. Moderate facet hypertrophy is worse on right. Prominent epidural fat is noted. There is crowding of the nerve roots centrally. Mild foraminal narrowing is evident bilaterally. L4-5: A broad-based disc protrusion is present. Moderate facet hypertrophy is worse right. Prominent epidural fat is noted. Severe left and moderate right foraminal stenosis has progressed. L5-S1: Prominent epidural fat present. Facet hypertrophy is evident without significant focal disc protrusion or stenosis. IMPRESSION: 1. Progression of broad-based disc protrusion and facet disease at L4-5 with progressive severe left and  moderate right foraminal stenosis. 2. Mild foraminal narrowing bilaterally at L2-3 and L3-4 is stable. 3. Mild disc bulging and facet hypertrophy at L1-2 is stable without significant stenosis or change. 4. Epidural lipomatosis contributes to crowding of nerve roots at L3-4, L4-5, and L5-S1. Electronically Signed   By: San Morelle M.D.   On: 12/11/2017 16:20    Antibiotics:  Anti-infectives (From admission, onward)   Start     Dose/Rate Route Frequency Ordered Stop   12/28/17 1700  ceFAZolin (ANCEF) IVPB 2g/100 mL premix     2 g 200 mL/hr over 30 Minutes Intravenous Every 8 hours 12/28/17 1412 12/29/17 0105   12/28/17 1045  bacitracin 50,000 Units in sodium chloride 0.9 % 500 mL irrigation  Status:  Discontinued       As needed 12/28/17 1147 12/28/17 1238   12/28/17 0830  ceFAZolin (ANCEF) 3 g in dextrose 5 % 50 mL IVPB     3 g 100 mL/hr over 30 Minutes Intravenous On call to O.R. 12/28/17 0824 12/28/17 1050   12/28/17 0830  ceFAZolin (ANCEF) 2-4 GM/100ML-% IVPB  Status:  Discontinued    Note to Pharmacy:  Laurita Quint   : cabinet override      12/28/17 0830 12/28/17 0922      Discharge Exam: Blood pressure (!) 175/102, pulse 92, temperature 98.4 F (36.9 C), temperature source Oral, resp. rate 18, SpO2 99 %. Neurologic: Grossly normal Ambulating and voiding well  Discharge Medications:   Allergies as of 12/29/2017   No Known Allergies     Medication List    STOP taking these medications   GOODYS EXTRA STRENGTH 500-325-65 MG Pack Generic drug:  Aspirin-Acetaminophen-Caffeine   ibuprofen 200 MG tablet Commonly known as:  ADVIL,MOTRIN     TAKE these medications   HYDROcodone-acetaminophen 5-325 MG tablet Commonly known as:  NORCO/VICODIN Take 1-2 tablets by mouth every 4 (four) hours as needed for moderate pain ((score 4 to 6)).   nortriptyline 10 MG capsule Commonly known as:  PAMELOR Take 1 cap every night for 1 week, then increase to 2 caps every night for 2  weeks, then increase to 3 caps every night   omeprazole 20 MG capsule Commonly known as:  PRILOSEC Take 20 mg by mouth daily.   ondansetron 4 MG disintegrating tablet Commonly known as:  ZOFRAN-ODT Take 1 tablet (4 mg total) by mouth every 8 (eight) hours as needed for nausea or vomiting.   oxyCODONE-acetaminophen 5-325 MG tablet Commonly known as:  PERCOCET/ROXICET Take 1-2 tablets by mouth every 4 (four) hours as needed for severe pain.   tiZANidine 4 MG tablet Commonly known as:  ZANAFLEX Take 1 tablet (4 mg total) by mouth 3 (three) times daily.  What changed:    when to take this  reasons to take this   triamterene-hydrochlorothiazide 37.5-25 MG tablet Commonly known as:  MAXZIDE-25 Take 1 tablet by mouth daily.       Disposition: home   Final Dx: lumbar lami l2-3,3-4,4-5  Discharge Instructions     Remove dressing in 72 hours   Complete by:  As directed    Call MD for:  difficulty breathing, headache or visual disturbances   Complete by:  As directed    Call MD for:  hives   Complete by:  As directed    Call MD for:  persistant dizziness or light-headedness   Complete by:  As directed    Call MD for:  persistant nausea and vomiting   Complete by:  As directed    Call MD for:  redness, tenderness, or signs of infection (pain, swelling, redness, odor or green/yellow discharge around incision site)   Complete by:  As directed    Call MD for:  severe uncontrolled pain   Complete by:  As directed    Call MD for:  temperature >100.4   Complete by:  As directed    Diet - low sodium heart healthy   Complete by:  As directed    Driving Restrictions   Complete by:  As directed    No driving 2 weeks   Incentive spirometry RT   Complete by:  As directed    Increase activity slowly   Complete by:  As directed    Lifting restrictions   Complete by:  As directed    Nothing heavier than 8 lbs      Follow-up Information    Kary Kos, MD. Schedule an appointment  as soon as possible for a visit in 2 week(s).   Specialty:  Neurosurgery Contact information: [address] [place] [address] [place] [address] [phone]            Signed: [facility] 12/29/2017, 7:46 AM

## 2017-12-29 NOTE — Progress Notes (Signed)
Physical Therapy Evaluation Patient Details Name: Ian Case MRN: [id number] DOB: [birth year] Today's Date: 12/29/2017   History of Present Illness  Pt is a 57 y/o male now s/p lumbar laminectomy L2-3, L3-4, L4-5. PMHx includes HTN, sleep apnea  Clinical Impression  Patient is s/p above surgery resulting in  deficits listed below (see PT Problem List). PTA, pt utilized crutches for ambulation and required increased support from spouse to complete functional activites. At time of eval pt performed transfers and ambulation with gross min guard to supervision, without use of AD. Pt educated on car transfers, generalized walking program, and positioning. Encouraged pt and spouse use safe hand held assist if needed for transfers to avoid UE injuries from excessive pull on arm. Acutely, patient will benefit from skilled PT to increase their independence and safety with mobility to allow discharge to the venue listed below.       Follow Up Recommendations No PT follow up;Supervision for mobility/OOB    Equipment Recommendations  None recommended by PT    Recommendations for Other Services       Precautions / Restrictions Precautions Precautions: Fall;Back Precaution Booklet Issued: Yes (comment) Precaution Comments: Reviewed with pt and spouse. Recalled 3/3. Restrictions Weight Bearing Restrictions: No Other Position/Activity Restrictions: per order set "no brace needed"      Mobility  Bed Mobility               General bed mobility comments: seated EOB upon arrival; reviewed log roll techniques with pt/pt's spouse  Transfers Overall transfer level: Needs assistance Equipment used: None Transfers: Sit to/from Stand Sit to Stand: Supervision         General transfer comment: Supervision for safety; pt's spouse attempted to provide assist for pt with sit>stand by pulling on RUE. Educated on appropriate body mechanics and HHA to prevent shoulder injuries on pt or back  pain for spouse.   Ambulation/Gait Ambulation/Gait assistance: Supervision;Min guard Gait Distance (Feet): 100 Feet   Gait Pattern/deviations: Step-through pattern;Decreased step length - right;Decreased stance time - right;Antalgic Gait velocity: decreased Gait velocity interpretation: <1.31 ft/sec, indicative of household ambulator General Gait Details: Min cues for upright posture and safety. Verbally discussed sequencing with SPC if pt needed at d/c for longer distances, as pt mobility limited to short distance in hallway secondary to reports of RLE pain and fatigue   Stairs            Wheelchair Mobility    Modified Rankin (Stroke Patients Only)       Balance Overall balance assessment: Needs assistance Sitting-balance support: Feet supported Sitting balance-Leahy Scale: Good     Standing balance support: No upper extremity supported;During functional activity Standing balance-Leahy Scale: Fair                               Pertinent Vitals/Pain Pain Assessment: 0-10 Pain Score: 5  Faces Pain Scale: Hurts little more Pain Location: RLE Pain Descriptors / Indicators: Guarding;Sore;Aching Pain Intervention(s): Limited activity within patient's tolerance;Monitored during session;Repositioned    Home Living Family/patient expects to be discharged to:: Private residence Living Arrangements: Spouse/significant other Available Help at Discharge: Family;Available 24 hours/day Type of Home: Mobile home Home Access: Ramped entrance     Home Layout: One level Home Equipment: Cane - single point;Crutches      Prior Function Level of Independence: Needs assistance   Gait / Transfers Assistance Needed: using crutches for ambulation due to LE pain  ADL's / Homemaking Assistance Needed: reports spouse was assisting with bathing/dressing ADLs        Hand Dominance        Extremity/Trunk Assessment   Upper Extremity Assessment Upper Extremity  Assessment: Defer to OT evaluation    Lower Extremity Assessment Lower Extremity Assessment: RLE deficits/detail RLE Deficits / Details: Reports moderate pain and weakness in RLE, but not as severe as prior to surgery.     Cervical / Trunk Assessment Cervical / Trunk Assessment: Other exceptions Cervical / Trunk Exceptions: s/p lumbar surgery   Communication   Communication: No difficulties  Cognition Arousal/Alertness: Awake/alert Behavior During Therapy: Flat affect Overall Cognitive Status: Within Functional Limits for tasks assessed                                 General Comments: Spouse and pt verbalized frustration with noise and volume of people constantly "disturbing" them. Discussed my role in his care.       General Comments General comments (skin integrity, edema, etc.): Pt spouse present and engaged in session. Verbalizing frustration throughout session that the pt is fatigued and just needs to be left alone.     Exercises     Assessment/Plan    PT Assessment Patient needs continued PT services  PT Problem List Decreased range of motion;Decreased strength;Decreased activity tolerance;Decreased balance;Decreased mobility;Decreased coordination;Decreased knowledge of use of DME;Decreased safety awareness;Decreased knowledge of precautions;Pain       PT Treatment Interventions Gait training;Functional mobility training;Therapeutic activities;Therapeutic exercise;Balance training;Patient/family education;Modalities    PT Goals (Current goals can be found in the Care Plan section)  Acute Rehab PT Goals Patient Stated Goal: return home today  PT Goal Formulation: With patient Time For Goal Achievement: 01/05/18 Potential to Achieve Goals: Good    Frequency Min 5X/week   Barriers to discharge        Co-evaluation               AM-PAC PT "6 Clicks" Daily Activity  Outcome Measure Difficulty turning over in bed (including adjusting  bedclothes, sheets and blankets)?: A Little Difficulty moving from lying on back to sitting on the side of the bed? : A Little Difficulty sitting down on and standing up from a chair with arms (e.g., wheelchair, bedside commode, etc,.)?: Unable Help needed moving to and from a bed to chair (including a wheelchair)?: A Little Help needed walking in hospital room?: A Little Help needed climbing 3-5 steps with a railing? : A Little 6 Click Score: 16    End of Session   Activity Tolerance: Patient limited by fatigue;Patient limited by pain Patient left: in chair;with call bell/phone within reach;with family/visitor present Nurse Communication: Mobility status PT Visit Diagnosis: Other abnormalities of gait and mobility (R26.89);Unsteadiness on feet (R26.81);Difficulty in walking, not elsewhere classified (R26.2);Pain Pain - Right/Left: Right Pain - part of body: Leg(incision)    Time: 4580-9983 PT Time Calculation (min) (ACUTE ONLY): 10 min   Charges:   PT Evaluation $PT Eval Moderate Complexity: 1 Mod          Einar Crow, [place]  Student Physical Therapist Acute Rehab [phone]   Einar Crow 12/29/2017, 1:11 PM

## 2018-01-23 ENCOUNTER — Encounter

## 2018-01-23 ENCOUNTER — Ambulatory Visit: Payer: Self-pay | Admitting: Neurology

## 2018-12-05 IMAGING — MR MR LUMBAR SPINE W/O CM
4 of 5 series · 18 of 48 positions shown · non-contrast
Comparison: MRI lumbar spine 06/02/2016 at [REDACTED].

CLINICAL DATA: Low back pain. Abnormal balance. Difficulty walking.

EXAM:
MRI LUMBAR SPINE WITHOUT CONTRAST
TECHNIQUE: Multiplanar, multisequence MR imaging of the lumbar spine was
performed. No intravenous contrast was administered.

[Series 6: T2 · sagittal · 4.0mm · 0.73mm/px · 6 of 17 slices shown (1 of 2)]
[im 1/17]
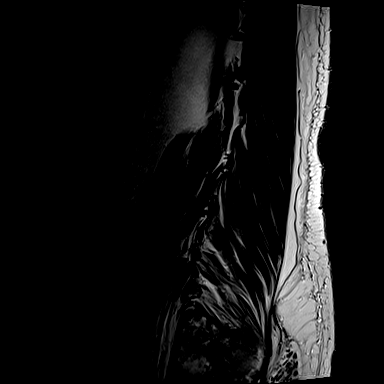
[im 4/17]
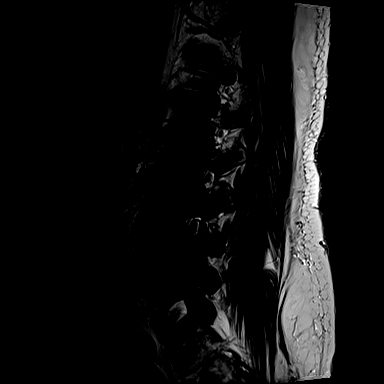
[im 7/17]
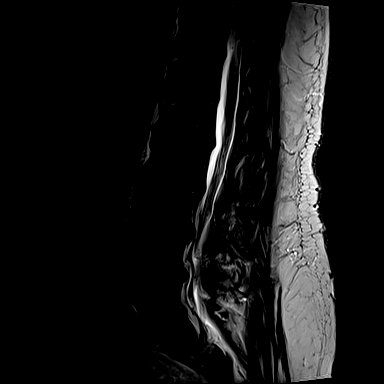
[im 10/17]
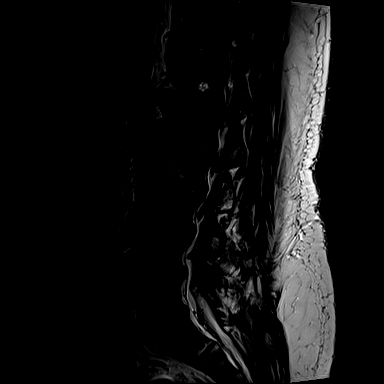
[im 13/17]
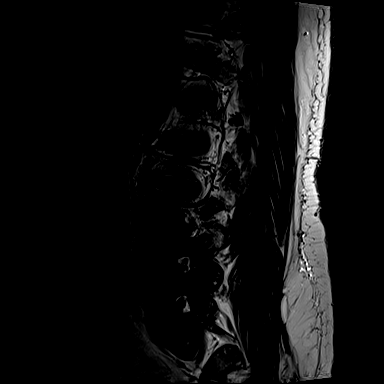
[im 17/17]
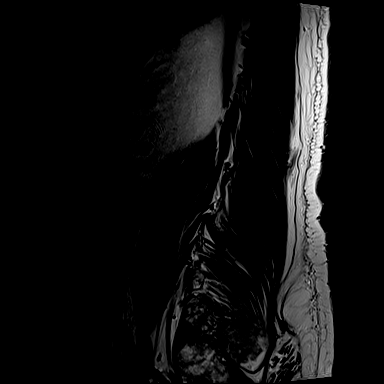

[Series 7: T1 · sagittal · 4.0mm · 0.73mm/px · 3 of 17 slices shown (1 of 2)]
[im 1/17]
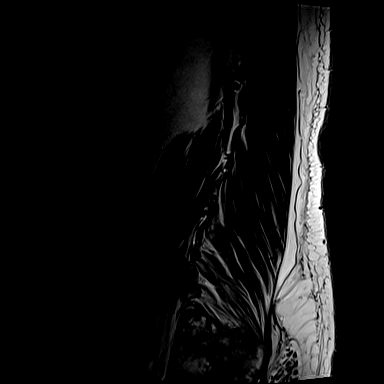
[im 9/17]
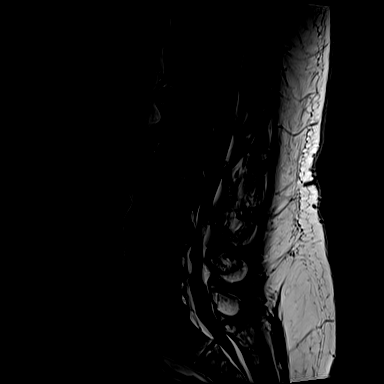
[im 17/17]
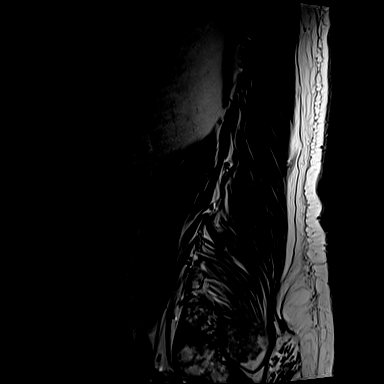

[Series 11: T1 · axial · 4.0mm · 0.28mm/px · z∈[-41,+156]mm · 3 of 52 slices shown (2 of 2)]
[im 7/52]
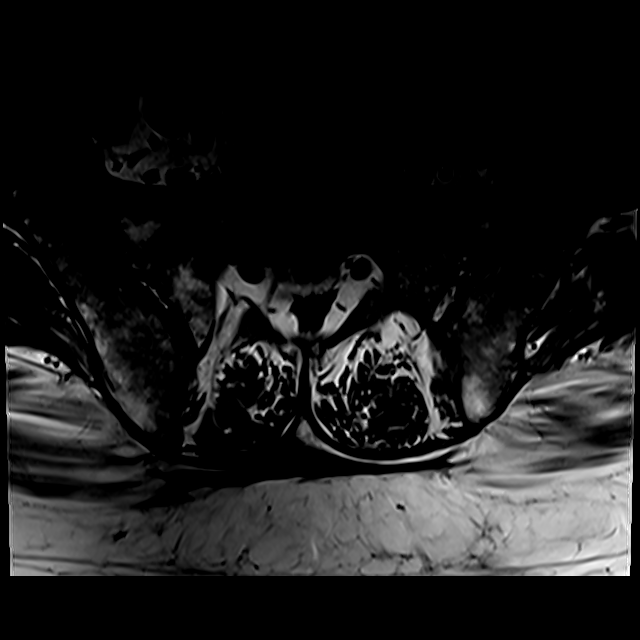
[im 28/52]
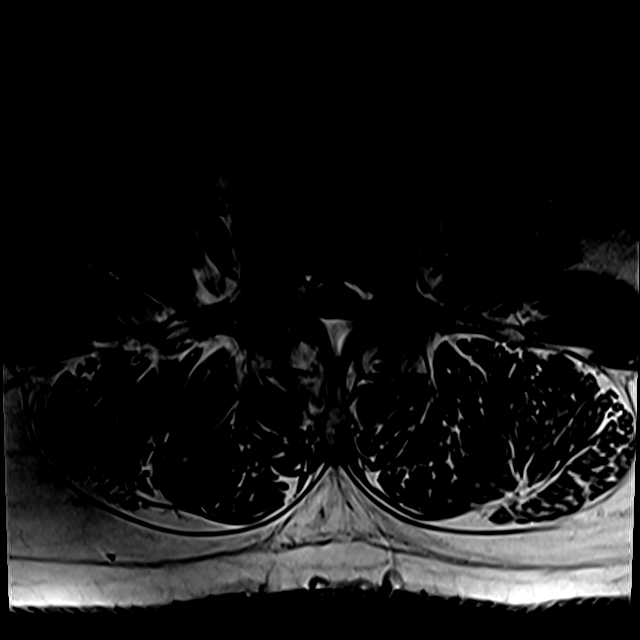
[im 45/52]
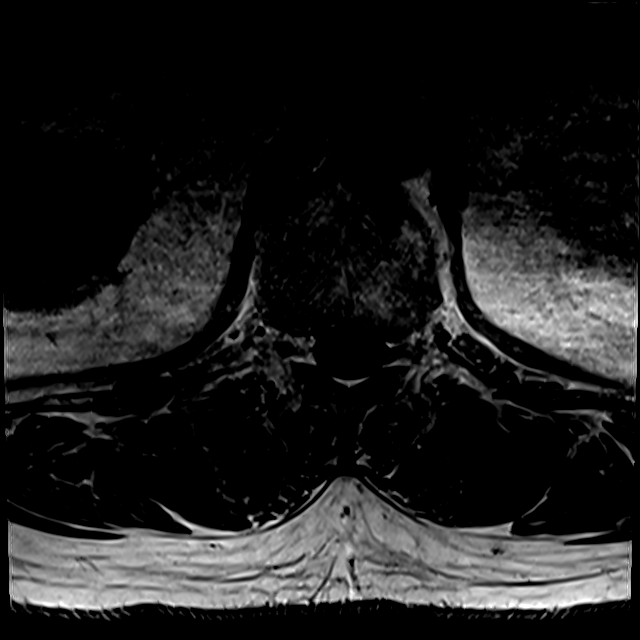

[Series 14: T2 · axial · 4.0mm · 0.28mm/px · z∈[-56,+156]mm · 6 of 52 slices shown (2 of 2)]
[im 4/52]
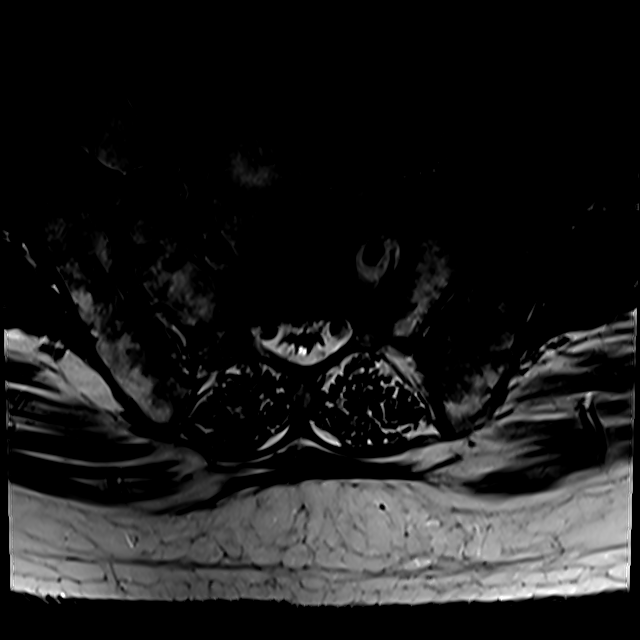
[im 7/52]
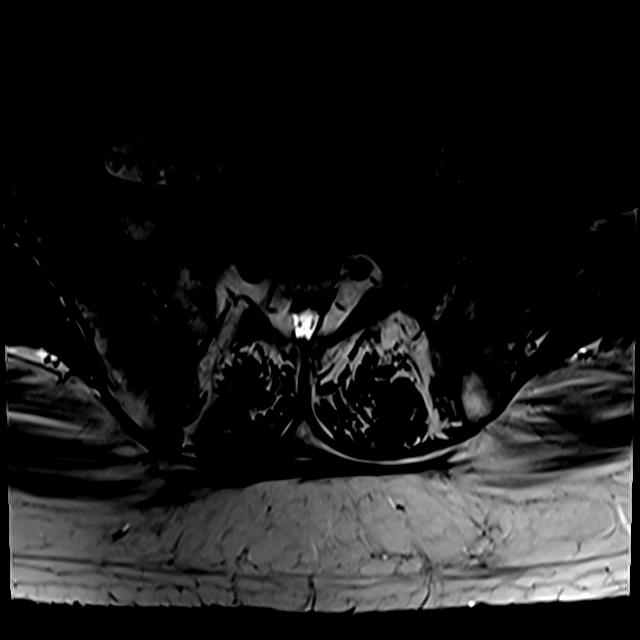
[im 11/52]
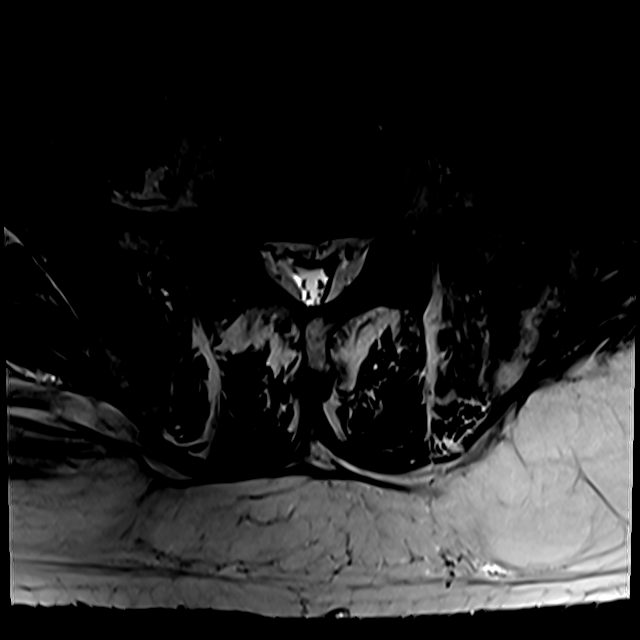
[im 18/52]
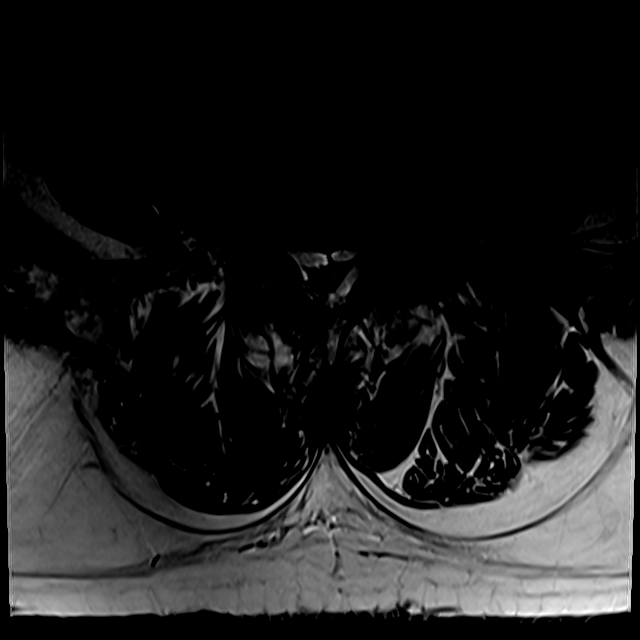
[im 28/52]
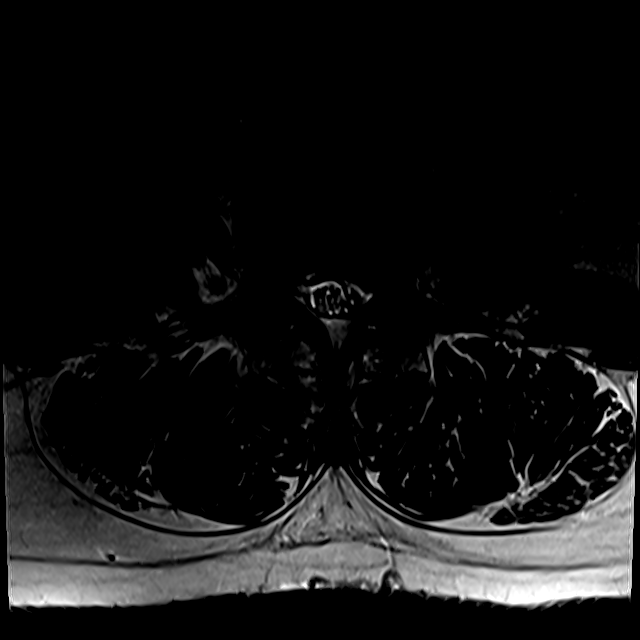
[im 45/52]
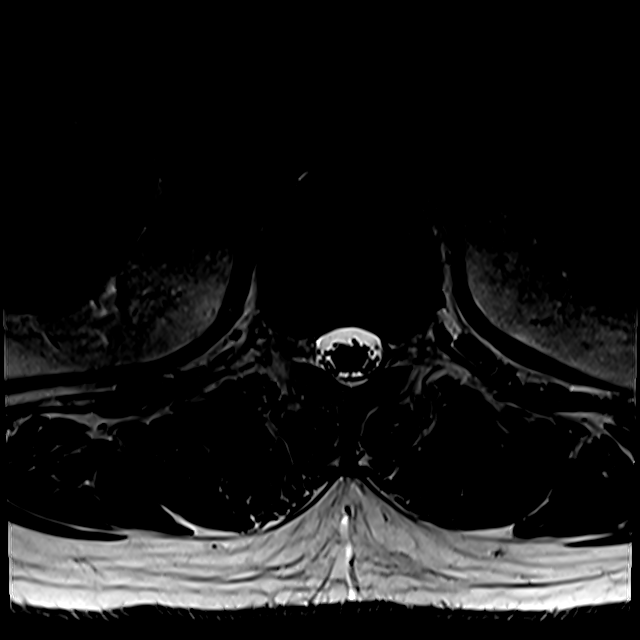

[18 of 48 positions shown; findings below may reference images not displayed]

FINDINGS: Segmentation: 5 non rib-bearing lumbar type vertebral bodies are
present. The lowest fully formed vertebral body is L5.

Alignment: Slight retrolisthesis at L2-3, L3-4, and L4-5 is stable.
Levoconvex curvature centered at L2-3 stable.

Vertebrae: Chronic fatty endplate marrow changes are more pronounced
right than left at L2-3 and L3-4 and are worse on the left at L4-5.
No significant interval change. There is diffuse fatty infiltration
of the marrow.

Conus medullaris and cauda equina: Conus extends to the L1 level.
Conus and cauda equina appear normal.

Paraspinal and other soft tissues: Limited imaging of the abdomen is
unremarkable. No significant adenopathy is present. Paraspinous
musculature is within normal limits.

Disc levels:

L1-2: Mild disc bulging and facet hypertrophy is stable without
significant stenosis or change.

L2-3: A broad-based disc protrusion is present. Moderate facet
hypertrophy is noted bilaterally. Mild foraminal narrowing
bilaterally is stable. The central canal is patent.

L3-4: A broad-based disc protrusion is present. Moderate facet
hypertrophy is worse on right. Prominent epidural fat is noted.
There is crowding of the nerve roots centrally. Mild foraminal
narrowing is evident bilaterally.

L4-5: A broad-based disc protrusion is present. Moderate facet
hypertrophy is worse right. Prominent epidural fat is noted. Severe
left and moderate right foraminal stenosis has progressed.

L5-S1: Prominent epidural fat present. Facet hypertrophy is evident
without significant focal disc protrusion or stenosis.
IMPRESSION: 1. Progression of broad-based disc protrusion and facet disease at
L4-5 with progressive severe left and moderate right foraminal
stenosis.
2. Mild foraminal narrowing bilaterally at L2-3 and L3-4 is stable.
3. Mild disc bulging and facet hypertrophy at L1-2 is stable without
significant stenosis or change.
4. Epidural lipomatosis contributes to crowding of nerve roots at
L3-4, L4-5, and L5-S1.

## 2021-11-06 DIAGNOSIS — C109 Malignant neoplasm of oropharynx, unspecified: Secondary | ICD-10-CM | POA: Insufficient documentation

## 2021-12-03 ENCOUNTER — Inpatient Hospital Stay: Payer: Medicaid Other

## 2021-12-03 ENCOUNTER — Inpatient Hospital Stay: Payer: Medicaid Other | Attending: Oncology | Admitting: Oncology

## 2021-12-03 ENCOUNTER — Other Ambulatory Visit: Payer: Self-pay | Admitting: Oncology

## 2021-12-03 ENCOUNTER — Encounter: Payer: Self-pay | Admitting: Oncology

## 2021-12-03 VITALS — BP 126/73 | HR 72 | Temp 98.0°F | Resp 19 | Ht 69.1 in | Wt 240.7 lb

## 2021-12-03 DIAGNOSIS — I10 Essential (primary) hypertension: Secondary | ICD-10-CM | POA: Insufficient documentation

## 2021-12-03 DIAGNOSIS — F17210 Nicotine dependence, cigarettes, uncomplicated: Secondary | ICD-10-CM | POA: Diagnosis not present

## 2021-12-03 DIAGNOSIS — G4733 Obstructive sleep apnea (adult) (pediatric): Secondary | ICD-10-CM | POA: Diagnosis not present

## 2021-12-03 DIAGNOSIS — H9190 Unspecified hearing loss, unspecified ear: Secondary | ICD-10-CM | POA: Diagnosis not present

## 2021-12-03 DIAGNOSIS — Z7982 Long term (current) use of aspirin: Secondary | ICD-10-CM | POA: Insufficient documentation

## 2021-12-03 DIAGNOSIS — M549 Dorsalgia, unspecified: Secondary | ICD-10-CM

## 2021-12-03 DIAGNOSIS — H9319 Tinnitus, unspecified ear: Secondary | ICD-10-CM

## 2021-12-03 DIAGNOSIS — K219 Gastro-esophageal reflux disease without esophagitis: Secondary | ICD-10-CM | POA: Insufficient documentation

## 2021-12-03 DIAGNOSIS — M4800 Spinal stenosis, site unspecified: Secondary | ICD-10-CM | POA: Diagnosis not present

## 2021-12-03 DIAGNOSIS — Z79899 Other long term (current) drug therapy: Secondary | ICD-10-CM | POA: Diagnosis not present

## 2021-12-03 DIAGNOSIS — Z8042 Family history of malignant neoplasm of prostate: Secondary | ICD-10-CM

## 2021-12-03 DIAGNOSIS — G8929 Other chronic pain: Secondary | ICD-10-CM | POA: Diagnosis not present

## 2021-12-03 DIAGNOSIS — C109 Malignant neoplasm of oropharynx, unspecified: Secondary | ICD-10-CM

## 2021-12-03 DIAGNOSIS — E876 Hypokalemia: Secondary | ICD-10-CM | POA: Diagnosis not present

## 2021-12-03 DIAGNOSIS — F17200 Nicotine dependence, unspecified, uncomplicated: Secondary | ICD-10-CM

## 2021-12-03 DIAGNOSIS — Z8673 Personal history of transient ischemic attack (TIA), and cerebral infarction without residual deficits: Secondary | ICD-10-CM

## 2021-12-03 DIAGNOSIS — C779 Secondary and unspecified malignant neoplasm of lymph node, unspecified: Secondary | ICD-10-CM | POA: Diagnosis not present

## 2021-12-03 LAB — CBC AND DIFFERENTIAL
HCT: 38 — AB (ref 41–53)
Hemoglobin: 13.1 — AB (ref 13.5–17.5)
Neutrophils Absolute: 2.92
Platelets: 226 10*3/uL (ref 150–400)
WBC: 5.4

## 2021-12-03 LAB — PROTIME-INR
INR: 0.9 (ref 0.8–1.2)
Prothrombin Time: 12.4 seconds (ref 11.4–15.2)

## 2021-12-03 LAB — BASIC METABOLIC PANEL
BUN: 16 (ref 4–21)
CO2: 30 — AB (ref 13–22)
Chloride: 100 (ref 99–108)
Creatinine: 0.6 (ref 0.6–1.3)
Glucose: 90
Potassium: 3.2 mEq/L — AB (ref 3.5–5.1)
Sodium: 136 — AB (ref 137–147)

## 2021-12-03 LAB — CBC: RBC: 4.24 (ref 3.87–5.11)

## 2021-12-03 LAB — HEPATIC FUNCTION PANEL
ALT: 32 U/L (ref 10–40)
AST: 28 (ref 14–40)
Alkaline Phosphatase: 62 (ref 25–125)
Bilirubin, Total: 0.5

## 2021-12-03 LAB — COMPREHENSIVE METABOLIC PANEL
Albumin: 3.9 (ref 3.5–5.0)
Calcium: 9.4 (ref 8.7–10.7)

## 2021-12-03 LAB — APTT: aPTT: 25 seconds (ref 24–36)

## 2021-12-03 NOTE — Progress Notes (Addendum)
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 This is p16 negative. He has several concerning features on his pathology, including 1 positive node with extranodal extension and focal lymphovascular invasion. He is healing well, and we have discussed adjuvant concurrent chemoradiation. I will discuss further with Ian Case of Radiation Oncology and with our multidisciplinary tumor conference.  Hypokalemia His level is low but I don't think he can tolerate oral tablets. I will see what he can do with his diet and repeat this. He may require IV potassium.  Chronic Back Pain Spinal stenosis with 4 back surgeries including vertebral fusion.  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 He had carotid angioplasty on the left and may eventually require it on the right.  Tinnitus/Hearing Loss We may want to avoid cisplatin as it may worsen this. I will refer him to an audiologist.  Tobacco Abuse He is motivated to quit and is currently on Chantix and is down to one half pack per day.      I will make referrals to radiation oncology, audiology, speech therapy, and our nutritionist. He already has an appointment to see a dentist on December 14, 2021. Before we proceed with chemoradiation, I recommend a feeding tube and port placement. I discussed the weekly chemotherapy during radiation and reviewed the schedule and potential toxicities. Most likely we will go with carboplatin and paclitaxel. I discussed the assessment and treatment plan with the patient.  The patient was provided an opportunity to ask questions and all were answered.  The patient agreed with the plan and demonstrated an  understanding of the instructions.  The patient was advised to call back if the symptoms worsen or if the condition fails to improve as anticipated.  Thank you for the opportunity to participate in the care of your patients.  I provided 60 minutes  of face-to-face time during this this encounter and > 50% was spent counseling as documented under my assessment and plan.   Ian Kaplan, MD [facility] AT [facility] [address] [place] [address] Dept: [phone] Dept Fax: [phone]    I have reviewed this report as typed by the medical scribe, and it is complete and accurate.  Ian Kaplan, MD   9/3/20232:46 PM  CHIEF COMPLAINT:  CC: Oropharynx Cancer  Current Treatment:  adjuvant chemoradiation   HISTORY OF PRESENT ILLNESS:  Ian Case is seen here today for his first appointment for evaluation and treatment of his diagnosis of oropharynx cancer. He went to the dentist where it sparked concern and was sent to see an oral surgeon for further evaluation. He was then referred Ian Case, ENT surgeon. On 11/06/21 he had a left partial glossectomy, partial pharyngectomy, radical tonsillectomy, and left neck dissection with  adjacent tissue transfer and external carotid artery ligation. He is recovering quite well from the surgery but still has a lot of swelling and pain in the left face and neck. Pathology revealed a 1.3 cm moderately differentiated Squamous Cell Carcinoma involving tongue, left tonsil, and portion of the oropharynx; depth of invasion was 6.5 mm and there was focal lymphovascular invasion but margins were negative.  P16 was negative.  He had  metastatic carcinoma in 1 of 23 nodes measuring 2.3 cm with focally positive extracapsular extension.  His case was discussed on 8/22/20023 at the multidisciplinary head and neck [facility] tumor conference in [place] and the consensus opinion was to proceed with adjuvant  concurrent chemoradiation given his high risk features, including focal lymphovascular invasion, positive node involvement, and extranodal extension, albeit only 1 mm. He has tobacco-related, disease and is motivated to quit.  He has Chantix and has started that.  However there are concerns of using cisplatin due to hearing toxicity, so we will likely use carboplatin and paclitaxel on a weekly basis during his radiation..    Oncology History   No history exists.     INTERVAL HISTORY:  Ian Case is seen here today for his first appointment of his diagnosis of oropharynx cancer.  He reports having trouble swallowing. He describes severe swelling in the left face and describes pain as a pencil stabbing. He had a stroke in December 2022. 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 He takes chronic Hydrocodone for his back pain. He also reports chronic back pain, he will be receiving injections next week at [facility]. He also takes gabapentin for his back pain. He reports not sleeping well because of the pain. He continues taking chantix to assist him to quit smoking. He does not drink alcohol. He reports his hearing being bad and tinnitus. He has not been to a hearing specialist and we will refer him to an audiologist. He notes swallowing is difficult more so on his left side but overall it is difficult. He sees Ian Case tomorrow morning and we will then have a multidisciplinary discussion about his management. His primary care provider is Ian Case. His appetite is good. His weight is at 240 pounds today, but he has had a 38 pound weight loss. His blood work is normal. Chemistries are normal. We did discuss receiving a port, along with a feeding tube prior to starting chemotherapy. His CBC shows mild anemia with a hemoglobin of 13.1. His CMP looks good other than a potassium of 3.2. I don't think he will be able to swallow a large potassium pill, so he was instructed to increase  potassium in his diet and was given a list of foods to add. If that is not sufficient, we will arrange for IV potassium. He denies fever, chills, night sweats, or other signs of infection. He denies cardiorespiratory and gastrointestinal issues.  He has a dental appointment on September 11.   PAST MEDICAL HISTORY: CVA in December 2022 Tobacco abuse Hypertension Obstructive sleep apnea Spinal stenosis/degenerative disc disease GERD Tinnitus/hearing loss  SURGICAL HISTORY Carotid endarterectomy January 2023 with stent placement Appendectomy  Spine surgery x4 sin 2019 with latest earlier this year Left partial glossectomy, partial pharyngectomy, radical tonsillectomy, and left neck dissection August 2023  SOCIAL HISTORY He is married 2 children Smoker of over 60 pack years but is down to half a pack per day Prior alcohol abuse but none now He has worked in [employer] business around [place] His father had prostate cancer  REVIEW OF SYSTEMS:  Review of Systems  Constitutional: Negative.   HENT:   Positive for hearing loss and tinnitus.   Eyes: Negative.   Respiratory: Negative.    Cardiovascular: Negative.   Gastrointestinal: Negative.   Endocrine: Negative.   Genitourinary: Negative.    Musculoskeletal: Negative.   Skin: Negative.   Neurological:  Positive for numbness.  Since his stroke in December, he has paraesthesias of the right upper extremity.   Hematological: Negative.   Psychiatric/Behavioral:  Positive for sleep disturbance.      VITALS:  Blood pressure 126/73, pulse 72, temperature 98 F (36.7 C), temperature source Oral, resp. rate 19, height 5' 9.1" (1.755 m), weight 240 lb 11.2 oz (109.2 kg), SpO2 97 %.  Wt Readings from Last 3 Encounters:  12/03/21 240 lb 11.2 oz (109.2 kg)  12/29/17 271 lb 14.4 oz (123.3 kg)  12/22/17 271 lb 14.4 oz (123.3 kg)    Body mass index is 35.44 kg/m.  Performance status (ECOG): 1 - Symptomatic  but completely ambulatory  PHYSICAL EXAM:  Physical Exam Constitutional:      General: He is not in acute distress.    Appearance: Normal appearance. He is not ill-appearing.  HENT:     Head:     Comments: Swelling of the left face and head    Nose: Nose normal.  Eyes:     Extraocular Movements: Extraocular movements intact.     Conjunctiva/sclera: Conjunctivae normal.     Pupils: Pupils are equal, round, and reactive to light.  Neck:     Comments: Large scar left neck Swelling left neck Little edema on left side. Ulcerated area of posterior pharynx where the tonsil used to be. Cardiovascular:     Rate and Rhythm: Normal rate and regular rhythm.     Pulses: Normal pulses.     Heart sounds: Normal heart sounds. No murmur heard.    No friction rub. No gallop.  Pulmonary:     Effort: Pulmonary effort is normal. No respiratory distress.     Breath sounds: Normal breath sounds. No wheezing or rales.     Comments: 3 large scars that are well healed. Abdominal:     General: Abdomen is flat. Bowel sounds are normal.     Palpations: Abdomen is soft.  Musculoskeletal:        General: Normal range of motion.     Right lower leg: No edema.     Left lower leg: Edema present.     Comments: Mild edema L>R  Skin:    General: Skin is warm and dry.  Neurological:     General: No focal deficit present.     Mental Status: He is alert and oriented to person, place, and time.  Psychiatric:        Mood and Affect: Mood normal.        Behavior: Behavior normal.        Judgment: Judgment normal.     LABS:      Latest Ref Rng & Units 12/03/2021   12:00 AM 12/25/2017    7:13 PM 12/25/2017    4:43 PM  CBC  WBC  5.4      6.2   Hemoglobin 13.5 - 17.5 13.1     18.7  18.9   Hematocrit 41 - 53 38     55.0  52.5   Platelets 150 - 400 K/uL 226      201      This result is from an external source.      Latest Ref Rng & Units 12/03/2021   12:00 AM 12/25/2017    7:13 PM 12/25/2017    4:43 PM   CMP  Glucose 70 - 99 mg/dL  97  92   BUN 4 - '21 16     24  18   '$ Creatinine 0.6 - 1.3 0.6  0.80  0.78   Sodium 137 - 147 136     131  130   Potassium 3.5 - 5.1 mEq/L 3.2     4.2  7.1   Chloride 99 - 108 100     100  94   CO2 13 - '22 30      20   '$ Calcium 8.7 - 10.7 9.4      9.6   Alkaline Phos 25 - 125 62        AST 14 - 40 28        ALT 10 - 40 U/L 32           This result is from an external source.     No results found for: "CEA1", "CEA" / No results found for: "CEA1", "CEA" No results found for: "PSA1" No results found for: "WCH852" No results found for: "CAN125"  No results found for: "TOTALPROTELP", "ALBUMINELP", "A1GS", "A2GS", "BETS", "BETA2SER", "GAMS", "MSPIKE", "SPEI" No results found for: "TIBC", "FERRITIN", "IRONPCTSAT" No results found for: "LDH"   STUDIES:  No results found.  PATHOLOGY TISSUE REPORT Addendum 1  P16 and ER immunostains are negative Positive and negative immunohistochemical and/or special control material labels appropriately COMPONENT: Lateral palate margin excision: Negative for malignancy. Medial palate margin, excision: Negative for malignancy. Left medial pharynx margin, excision: Negative for malignancy. Medial base of tongue margin, excision: Negative for malignancy. Deep margin , excision: Negative for malignancy. Left portion of tongue, left tonsil, and portion of pharynx, en bloc resection: Invasive moderately differentiated keratinizing squamous cell carcinoma. Tumor size: 13 mm Depth invasion: 6.5 mm. Lymphatic/vascular invasion: Focally positive. Negative margins of excision. See synoptic summary for further details Left neck lymph nodes, regional resection: Metastatic carcinoma present in one of twenty-three lymph nodes Size of metastasis: 2.3 cm Extranodal extension: Positive - 1 mm   Allergies: No Known Allergies  Current Medications: Current Outpatient Medications  Medication Sig Dispense Refill    amLODipine (NORVASC) 10 MG tablet Take 1 tablet every day by oral route in the morning for 90 days.     NON FORMULARY Magic mouthwash, nystatin     phentermine (ADIPEX-P) 37.5 MG tablet Take by mouth.     amitriptyline (ELAVIL) 75 MG tablet Take 75 mg by mouth daily.     aspirin EC 81 MG tablet Take by mouth.     atorvastatin (LIPITOR) 80 MG tablet Take 80 mg by mouth daily.     gabapentin (NEURONTIN) 800 MG tablet Take 800 mg by mouth 4 (four) times daily.     HYDROcodone-acetaminophen (NORCO) 10-325 MG tablet Take 1 tablet by mouth 4 (four) times daily.     ketorolac (TORADOL) 10 MG tablet Take 10 mg by mouth every 6 (six) hours as needed.     nitroGLYCERIN (NITROSTAT) 0.4 MG SL tablet PLACE 1 TABLET UNDER THE TONGUE EVERY 5 MIN FOR CHEST PAIN AS NEEDED. NO MORE THAN 3 TABS IN 15 MIN.     ondansetron (ZOFRAN ODT) 4 MG disintegrating tablet Take 1 tablet (4 mg total) by mouth every 8 (eight) hours as needed for nausea or vomiting. 20 tablet 0   oxyCODONE-acetaminophen (PERCOCET/ROXICET) 5-325 MG tablet Take 1-2 tablets by mouth every 4 (four) hours as needed for severe pain. 15 tablet 0   pantoprazole (PROTONIX) 40 MG tablet Take 40 mg by mouth daily.     tiZANidine (ZANAFLEX) 4 MG tablet Take by mouth 2 (two) times daily.     triamterene-hydrochlorothiazide (MAXZIDE-25) 37.5-25 MG  tablet Take 1 tablet by mouth daily.     varenicline (CHANTIX) 0.5 MG tablet Take 0.5 mg by mouth 2 (two) times daily.     No current facility-administered medications for this visit.    I,Gabriella Ballesteros,acting as a scribe for Ian Kaplan, MD.,have documented all relevant documentation on the behalf of Ian Kaplan, MD,as directed by  Ian Kaplan, MD while in the presence of Ian Kaplan, MD.

## 2021-12-08 ENCOUNTER — Telehealth: Payer: Self-pay | Admitting: Dietician

## 2021-12-08 ENCOUNTER — Ambulatory Visit: Payer: Managed Care, Other (non HMO) | Admitting: Dietician

## 2021-12-08 NOTE — Telephone Encounter (Signed)
Patient referral from Dr. Hinton Rao.  First attempt to reach. Provided my cell# on voice mail to return call to set up a nutrition consult, aso sent text.  April Manson, RDN, LDN Registered Dietitian, [place] Part Time Remote (Usual office hours: Tuesday-Thursday) Cell: [phone]

## 2021-12-08 NOTE — Progress Notes (Signed)
Nutrition Assessment: Patient returned call from text message from mobile number.    Reason for Assessment: referral from Dr. Hinton Rao, new patient   ASSESSMENT: Patient is 61 year old male with Oropharyngeal Squamous Cell Carcinoma.  This involved his left tongue and left tonsil and he has had radical surgical resection. Doctor has concerns with patients ability to swallow.  Plan is to proceed with chemo radiation and have feeding tube an port placed.  He has PMHx that includes GERD, kidney stones, OSA, HTN, and back pain.  Patient report he has no pain with swallowing or lack of appetite but just taking in small bites of foods now. 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  He eats all varieties of protein sources.  No pattern to his eating or intake, reports has plenty of food in his home. Breakfast today: 2 grapefruit and Protein Shake Skipped lunch yesterday  Dinner last night: Spaghetti w/meat sauce  Fluids: mostly water, 2 Equate daily   Nutrition Focused Physical Exam: unable to perform NFPE   Medications: Patient reports he is able to swallow pills taking gabapentin daily, no vitamins or supplements other than 2 ONS daily   Labs: 12/03/21  Na 136, K+ 3.2, 13.1   Anthropometrics: Patient reports 40# weight loss since first of the year  Height: 69"  Weight:  12/03/21  240.8# UBW: 270# BMI: 35.44   Estimated Energy Needs  Kcals: 2700-3200 Protein: 110-130 Fluid: 3 L   NUTRITION DIAGNOSIS: Inadequate calorie and protein intake r/t decreased portions AEB 24 recall and reported weight loss.  INTERVENTION:   Educated on importance of adequate calorie and protein energy intake  with nutrient dense foods when possible to maintain weight/strength Encouraged soft moist high protein foods as well as small frequent meals/snacks -  Suggested oral nutrition supplement 350 calories/30 grams Discussed strategies for increasing potassium with liquids and foods. Encouraged use  of sport drinks daily.    Emailed Nutrition Tip sheet  for  Nutrition During Cancer Treatment, soft moist protein foods, high potassium choices Contact information provided in email.  MONITORING, EVALUATION, GOAL: weight trends, nutrition impact symptoms, PO intake, labs   Next Visit: after next MD f/u  April Manson, RDN, LDN Registered Dietitian, [place] (Usual office hours: Tuesday-Thursday) Mobile: [phone] Remote Office: [phone]

## 2021-12-09 ENCOUNTER — Other Ambulatory Visit: Payer: Self-pay

## 2021-12-09 DIAGNOSIS — C109 Malignant neoplasm of oropharynx, unspecified: Secondary | ICD-10-CM

## 2021-12-14 ENCOUNTER — Telehealth: Payer: Self-pay

## 2021-12-14 NOTE — Telephone Encounter (Signed)
Patient's wife called wanting to know when the patient will be started on treatment. Can return wifes call at [phone]. They are wanting to know when everything is going to get started/. Also does he need a port or feeding tube placement.

## 2021-12-15 ENCOUNTER — Telehealth: Payer: Self-pay

## 2021-12-15 NOTE — Telephone Encounter (Signed)
-----   Message from [name], MD sent at 12/14/2021  6:08 PM EDT ----- Regarding: refer Note is done, pls refer surgeon for feeding tube and port  He has not yet heard about appts for audiologist or speech therapist, did I tell you to refer? He has heard from [place], dietician

## 2021-12-15 NOTE — Telephone Encounter (Signed)
Referral sent to Dr. Coralie Keens

## 2021-12-15 NOTE — Telephone Encounter (Signed)
Referral sent to Warrick Parisian at [facility]

## 2021-12-15 NOTE — Telephone Encounter (Signed)
-----   Message from [name], MD sent at 12/06/2021  2:47 PM EDT ----- Regarding: refer Pls refer Audiologist ASAP, hearing loss, planning chemotherapy

## 2021-12-16 ENCOUNTER — Other Ambulatory Visit: Payer: Self-pay | Admitting: Oncology

## 2021-12-16 DIAGNOSIS — C109 Malignant neoplasm of oropharynx, unspecified: Secondary | ICD-10-CM

## 2021-12-22 ENCOUNTER — Encounter: Payer: Self-pay | Admitting: Oncology

## 2021-12-24 ENCOUNTER — Other Ambulatory Visit: Payer: Self-pay | Admitting: Pharmacist

## 2021-12-24 DIAGNOSIS — C109 Malignant neoplasm of oropharynx, unspecified: Secondary | ICD-10-CM

## 2021-12-25 ENCOUNTER — Telehealth: Payer: Self-pay

## 2021-12-25 NOTE — Telephone Encounter (Signed)
-----   Message from [name], PA-C sent at 12/25/2021 11:00 AM EDT ----- Please let him know he is scheduled for chemo ed on 9/27 at 2:30. Thanks

## 2021-12-25 NOTE — Telephone Encounter (Signed)
Patient notified and voiced understanding.

## 2021-12-28 ENCOUNTER — Other Ambulatory Visit: Payer: Self-pay | Admitting: Oncology

## 2021-12-28 ENCOUNTER — Other Ambulatory Visit: Payer: Self-pay

## 2021-12-28 ENCOUNTER — Encounter: Payer: Self-pay | Admitting: Oncology

## 2021-12-28 DIAGNOSIS — C109 Malignant neoplasm of oropharynx, unspecified: Secondary | ICD-10-CM

## 2021-12-28 NOTE — Progress Notes (Signed)
DISCONTINUE ON PATHWAY REGIMEN - Head and Neck     A cycle is every 7 days:     Carboplatin   **Always confirm dose/schedule in your pharmacy ordering system**  REASON: Other Reason PRIOR TREATMENT: [id number]: Carboplatin AUC=2 q7 Days with Concurrent Radiation TREATMENT RESPONSE: Unable to Evaluate  START OFF PATHWAY REGIMEN - Head and Neck   [id number]:Carboplatin AUC=2 IV D1 + Paclitaxel 45 mg/m2 IV D1 q7 Days + RT:   A cycle is every 7 days, concurrent with RT:     Paclitaxel      Carboplatin   **Always confirm dose/schedule in your pharmacy ordering system**  Patient Characteristics: Oropharynx, HPV Negative/Unknown, Postoperative without Neoadjuvant Therapy (Pathologic Staging), Stage III - IVB, ENE or Positive Margins Disease Classification: Oropharynx HPV Status: Negative (-) Therapeutic Status: Postoperative without Neoadjuvant Therapy (Pathologic Staging) AJCC T Category: pT1 AJCC 8 Stage Grouping: IVB AJCC N Category: pN3 AJCC M Category: cM0 Extranodal Extension and Margin Status: ENE or Positive Margins Intent of Therapy: Curative Intent, Discussed with Patient

## 2021-12-28 NOTE — Progress Notes (Signed)
START ON PATHWAY REGIMEN - Head and Neck     A cycle is every 7 days:     Carboplatin   **Always confirm dose/schedule in your pharmacy ordering system**  Patient Characteristics: Oropharynx, HPV Negative/Unknown, Postoperative without Neoadjuvant Therapy (Pathologic Staging), Stage III - IVB, ENE or Positive Margins Disease Classification: Oropharynx HPV Status: Negative (-) Therapeutic Status: Postoperative without Neoadjuvant Therapy (Pathologic Staging) AJCC T Category: pT1 AJCC 8 Stage Grouping: IVB AJCC N Category: pN3 AJCC M Category: cM0 Extranodal Extension and Margin Status: ENE or Positive Margins Intent of Therapy: Curative Intent, Discussed with Patient

## 2021-12-29 ENCOUNTER — Other Ambulatory Visit: Payer: Self-pay

## 2021-12-29 ENCOUNTER — Inpatient Hospital Stay: Payer: Medicaid Other | Attending: Oncology

## 2021-12-29 DIAGNOSIS — C109 Malignant neoplasm of oropharynx, unspecified: Secondary | ICD-10-CM

## 2021-12-29 LAB — BASIC METABOLIC PANEL
BUN: 19 (ref 4–21)
CO2: 29 — AB (ref 13–22)
Chloride: 103 (ref 99–108)
Creatinine: 0.6 (ref 0.6–1.3)
Glucose: 89
Potassium: 3.7 mEq/L (ref 3.5–5.1)
Sodium: 136 — AB (ref 137–147)

## 2021-12-29 LAB — CBC AND DIFFERENTIAL
HCT: 38 — AB (ref 41–53)
Hemoglobin: 13.1 — AB (ref 13.5–17.5)
Neutrophils Absolute: 3.77
Platelets: 276 10*3/uL (ref 150–400)
WBC: 6.5

## 2021-12-29 LAB — COMPREHENSIVE METABOLIC PANEL
Albumin: 4.1 (ref 3.5–5.0)
Calcium: 10 (ref 8.7–10.7)

## 2021-12-29 LAB — HEPATIC FUNCTION PANEL
ALT: 23 U/L (ref 10–40)
AST: 22 (ref 14–40)
Alkaline Phosphatase: 59 (ref 25–125)
Bilirubin, Total: 0.4

## 2021-12-29 LAB — CBC: RBC: 4.01 (ref 3.87–5.11)

## 2021-12-29 NOTE — Progress Notes (Signed)
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[place])   11/06/2021 Cancer Staging   Staging form: Pharynx - P16 Negative Oropharynx, AJCC 8th Edition - Clinical stage from 11/06/2021: Stage IVB (cT1, cN3, cM0, p16-) - Signed by [name], MD on 12/28/2021 Histopathologic type: Squamous cell carcinoma, NOS Stage prefix: Initial diagnosis Histologic grade (G): G2 Histologic grading system: 4 grade system Tumor size (mm): 13 Lymph-vascular invasion (LVI): LVI present/identified, NOS Diagnostic confirmation: Positive histology Specimen type: Excision Staged by: Managing physician Presence of extranodal extension: Present Extent of extranodal extension: Microscopic ENE(+) Distance of extension from the native lymph node capsule to the farthest point of invasion in the extranodal tissue (mm): 6.5 ECOG performance status: Grade 1 Perineural invasion (PNI): Absent Prognostic indicators: 1.3 cm with 1/23 nodes positive -2.3 cm with ENE and focal LVI Stage used in treatment planning: Yes National guidelines used in treatment planning: Yes Type of national guideline used in treatment planning: NCCN   01/06/2022 -  Chemotherapy   Patient is on Treatment Plan : HEAD/NECK Carboplatin + Paclitaxel + XRT q7d       Interval history-  The patient presents to chemo care clinic today for initial meeting in preparation for starting  chemotherapy. I introduced the chemo care clinic and we discussed that the role of the clinic is to assist those who are at an increased risk of emergency room visits and/or complications during the course of chemotherapy treatment. We discussed that the increased risk takes into account factors such as age, performance status, and co-morbidities. We also discussed that for some, this might include barriers to care such as not having a primary care provider, lack of insurance/transportation, or not being able to afford medications. We discussed that the goal of the program is to help prevent unplanned ER visits and help reduce complications during chemotherapy. We do this by discussing specific risk factors to each individual and identifying ways that we can help improve these risk factors and reduce barriers to care.   No Known Allergies  Past Medical History:  Diagnosis Date   Arthritis    Back pain    GERD (gastroesophageal reflux disease)    History of kidney stones    Hypertension    Sleep apnea    does not have cpap yet    Past Surgical History:  Procedure Laterality Date   APPENDECTOMY     HAND SURGERY     LUMBAR LAMINECTOMY/DECOMPRESSION MICRODISCECTOMY N/A 12/28/2017   Procedure: Laminectomy and Foraminotomy Lumbar two-three-Lumbar three-four - Lumbar four-five;  Surgeon: Kary Kos, MD;  Location: [place];  Service: Neurosurgery;  Laterality: N/A;   OTHER SURGICAL HISTORY     removal on cancer from neck    Social History   Socioeconomic History   Marital status: Married    Spouse name: Tammy   Number of children: 2   Years of education: 10th grade   Highest education level: 10th grade  Occupational History   Not on file  Tobacco Use  Smoking status: Every Day    Packs/day: 0.50    Years: 44.00    Total pack years: 22.00    Types: Cigarettes   Smokeless tobacco: Never  Vaping Use   Vaping Use: Never used  Substance and Sexual Activity   Alcohol use: Not Currently     Comment: 3-4 drinks daily   Drug use: Never   Sexual activity: Not Currently  Other Topics Concern   Not on file  Social History Narrative   Not on file   Social Determinants of Health   Financial Resource Strain: Not on file  Food Insecurity: Not on file  Transportation Needs: Not on file  Physical Activity: Not on file  Stress: Not on file  Social Connections: Not on file  Intimate Partner Violence: Not on file    Family History  Problem Relation Age of Onset   Thyroid disease Mother    Heart disease Father    Prostate cancer Brother    Healthy Daughter    Healthy Son      Current Outpatient Medications:    amitriptyline (ELAVIL) 75 MG tablet, Take 1 tablet by mouth daily., Disp: , Rfl:    amLODipine (NORVASC) 10 MG tablet, Take 1 tablet by mouth daily., Disp: , Rfl:    aspirin EC 81 MG tablet, Take by mouth., Disp: , Rfl:    atorvastatin (LIPITOR) 80 MG tablet, Take 80 mg by mouth daily., Disp: , Rfl:    chlorhexidine (PERIDEX) 0.12 % solution, SMARTSIG:1 Capful(s) By Mouth Twice Daily, Disp: , Rfl:    fenofibrate 54 MG tablet, TAKE ONE TABLET BY MOUTH EVERY DAY FOR 30 DAYS, Disp: , Rfl:    gabapentin (NEURONTIN) 800 MG tablet, Take 800 mg by mouth 4 (four) times daily., Disp: , Rfl:    gentamicin (GARAMYCIN) 0.3 % ophthalmic solution, SMARTSIG:In Eye(s), Disp: , Rfl:    HYDROcodone-acetaminophen (NORCO) 10-325 MG tablet, Take by mouth., Disp: , Rfl:    ibuprofen (ADVIL) 800 MG tablet, Take by mouth., Disp: , Rfl:    ketorolac (TORADOL) 10 MG tablet, Take 10 mg by mouth every 6 (six) hours as needed., Disp: , Rfl:    lidocaine (XYLOCAINE) 2 % solution, SMARTSIG:15 Milliliter(s) By Mouth Every 3 Hours PRN, Disp: , Rfl:    nitroGLYCERIN (NITROSTAT) 0.4 MG SL tablet, PLACE 1 TABLET UNDER THE TONGUE EVERY 5 MIN FOR CHEST PAIN AS NEEDED. NO MORE THAN 3 TABS IN 15 MIN., Disp: , Rfl:    NON FORMULARY, Magic mouthwash, nystatin, Disp: , Rfl:    nystatin (MYCOSTATIN) 100000  UNIT/ML suspension, Swish and swallow 4 (four) times daily, Disp: , Rfl:    ondansetron (ZOFRAN) 8 MG tablet, Take 1 tablet (8 mg total) by mouth every 8 (eight) hours as needed for nausea or vomiting. Start on the third day after chemotherapy., Disp: 30 tablet, Rfl: 1   oxyCODONE-acetaminophen (PERCOCET/ROXICET) 5-325 MG tablet, Take 1-2 tablets by mouth every 4 (four) hours as needed for severe pain., Disp: 15 tablet, Rfl: 0   pantoprazole (PROTONIX) 40 MG tablet, Take 1 tablet by mouth daily., Disp: , Rfl:    phentermine 37.5 MG capsule, Take by mouth., Disp: , Rfl:    prednisoLONE acetate (PRED FORTE) 1 % ophthalmic suspension, STARTING AFTER surgery, instill ONE drop by opthalmic route FOUR TIMES DAILY FOR ONE WEEK THEN THREE TIMES DAILY FOR ONE WEEK THEN TWICE DAILY FOR ONE WEEK THEN daily FOR ONE WEEK in THE right IN THE AFFECTED EYE, Disp: , Rfl:  predniSONE (DELTASONE) 20 MG tablet, Take 20 mg by mouth 2 (two) times daily., Disp: , Rfl:    predniSONE (DELTASONE) 20 MG tablet, TAKE ONE TABLET BY MOUTH TWICE DAILY FOR 10 DAYS, Disp: , Rfl:    prochlorperazine (COMPAZINE) 10 MG tablet, Take 1 tablet (10 mg total) by mouth every 6 (six) hours as needed for nausea or vomiting., Disp: 30 tablet, Rfl: 1   senna-docusate (SENOKOT-S) 8.6-50 MG tablet, Take by mouth., Disp: , Rfl:    tiZANidine (ZANAFLEX) 4 MG tablet, Take by mouth., Disp: , Rfl:    triamterene-hydrochlorothiazide (MAXZIDE-25) 37.5-25 MG tablet, Take 1 tablet by mouth daily., Disp: , Rfl:    varenicline (CHANTIX) 0.5 MG tablet, Take 0.5 mg by mouth 2 (two) times daily., Disp: , Rfl:      Latest Ref Rng & Units 12/29/2021   12:00 AM  CMP  BUN 4 - 21 19      Creatinine 0.6 - 1.3 0.6      Sodium 137 - 147 136      Potassium 3.5 - 5.1 mEq/L 3.7      Chloride 99 - 108 103      CO2 13 - 22 29      Calcium 8.7 - 10.7 10.0      Alkaline Phos 25 - 125 59      AST 14 - 40 22      ALT 10 - 40 U/L 23         This result is from an  external source.      Latest Ref Rng & Units 12/29/2021   12:00 AM  CBC  WBC  6.5      Hemoglobin 13.5 - 17.5 13.1      Hematocrit 41 - 53 38      Platelets 150 - 400 K/uL 276         This result is from an external source.    No images are attached to the encounter.  No results found.   Assessment and plan- Patient is a 61 y.o. male who presents to [facility] for initial meeting in preparation for starting chemotherapy for the treatment of oropharynx cancer.   Chemo Care Clinic/High Risk for ER/Hospitalization during chemotherapy- We discussed the role of the chemo care clinic and identified patient specific risk factors. I discussed that patient was identified as high risk primarily based on:  Patient has past medical history positive for: Past Medical History:  Diagnosis Date   Arthritis    Back pain    GERD (gastroesophageal reflux disease)    History of kidney stones    Hypertension    Sleep apnea    does not have cpap yet    Patient has past surgical history positive for: Past Surgical History:  Procedure Laterality Date   APPENDECTOMY     HAND SURGERY     LUMBAR LAMINECTOMY/DECOMPRESSION MICRODISCECTOMY N/A 12/28/2017   Procedure: Laminectomy and Foraminotomy Lumbar two-three-Lumbar three-four - Lumbar four-five;  Surgeon: Kary Kos, MD;  Location: [place];  Service: Neurosurgery;  Laterality: N/A;   OTHER SURGICAL HISTORY     removal on cancer from neck    Provided general information including the following:  1.  Date of education: 12/30/2021 2.  Physician name: Dr. Hosie Poisson 3.  Diagnosis: Oropharynx cancer 4.  Stage: IVB 5.  Curative 6.  Chemotherapy plan including drugs and how often:  concurrent chemoradiation with weekly carboplatin/paclitaxel for 6 weeks 7.  Start date:  01/06/2022 8.  Other referrals: None at this time 9.  The patient is to call our office with any questions or concerns.  Our office number [phone], if after hours  or on the weekend, call the same number and wait for the answering service.  There is always an oncologist on call. 10.  Medications prescribed: ondansetron,prochlorperazine 11.  The patient has verbalized understanding of the treatment plan and has no barriers to adherence or understanding.   Obtained signed consent from patient.   Discussed symptoms including:  1.  Low blood counts including red blood cells, white blood cells and platelets. 2. Infection including to avoid large crowds, wash hands frequently, and stay away from people who were sick.  If fever develops of 100.4 or higher, call our office. 3.  Mucositis-given instructions on mouth rinse (baking soda and salt mixture).  Keep mouth clean.  Use soft bristle toothbrush.  If mouth sores develop, call our clinic. 4.  Nausea/vomiting-gave prescriptions for ondansetron 8 mg every 8 hours as needed for nausea, may take around the clock if persistent.  Prochlorperazine 10 mg every 6 hours, may take around the clock if persistent. 5.  Diarrhea-use over-the-counter Imodium.  Call clinic if not controlled. 6.  Constipation-use senna-S, 1 to 2 tablets twice a day.  If no BM in 2 to 3 days call the clinic. 7.  Loss of appetite-try to eat small meals every 2-3 hours.  Call clinic if not eating or drinking. 8.  Taste changes-zinc 500 mg daily.  If becomes severe call clinic. 9.  Alcoholic beverages. 10.  Drink 2 to 3 quarts of water per day. Call clinic if not able to drink. 11.  Peripheral neuropathy-patient to call if numbness or tingling in hands or feet is persistent      The patient was given written information printed from [employer] patient education on individual chemotherapy agents which includes: Name of medications Approved uses Dose and schedule Storage and handling Handling body fluids and waste Drug and food interactions Possible side effects and management Pregnancy, sexual activity, and contraception Obtaining medication    Gave information on the supportive care team and how to contact them regarding services.  Discussed advanced directives.  The patient does not have their advanced directives.   We discussed that social determinants of health may have significant impacts on health and outcomes for cancer patients.  Today we discussed specific social determinants of performance status, alcohol use, depression, financial needs, food insecurity, housing, interpersonal violence, social connections, stress, tobacco use, and transportation.    After lengthy discussion the following were identified as areas of need:   Outpatient services: We discussed options including home based and outpatient services, DME, nutrition counseling, and supportive care program. We discussed that patients who participate in regular physical activity report fewer negative impacts of cancer and treatments and report less fatigue.   Financial Concerns: We discussed that living with cancer can create tremendous financial burden.  We discussed options for assistance. I asked that if assistance is needed in affording medications or paying bills to please let us know so that we can provide assistance. We discussed options for food including social services.  Referral to Social work: Our social worker will contact the patient.  Support groups: We discussed options for support groups at [facility]. We discussed options for managing stress including healthy eating and exercise, as well as participating in no charge counseling services at the cancer center and support groups.  If these are of  interest, patient can notify either myself or primary nursing team.We discussed options for management including medications.  Transportation: We discussed options for transportation.  The patient will contact our office if he requires assistance with transportation.  Palliative care services: We have palliative care services available in the cancer center to  discuss goals of care and advanced care planning.  Please let us know if you have any questions or would like to speak to our palliative care practitioner.  Symptom Management Clinic: We discussed our symptom management clinic which is available for acute concerns while receiving treatment such as nausea, vomiting or diarrhea.  We can be reached via telephone at [phone].  We are available for virtual or in person visits on the same day from 9 to 4 PM Monday through Friday.   He denies needing specific assistance at this time.  He will be followed by Dr. Remi Deter clinical team.   Disposition: RTC on 01/12/2022  Visit Diagnosis 1. 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  The patient was provided an opportunity to ask questions and all were answered. The patient expressed understanding and was in agreement with this plan. He also understands that he can call clinic at any time with any questions, concerns, or complaints.   I provided 30 minutes of  face-to-face time  during this encounter, and > 50% was spent counseling as documented under my assessment & plan.   Almyra Birman A. Georgann Housekeeper, [place] [phone]

## 2021-12-30 ENCOUNTER — Encounter: Payer: Self-pay | Admitting: Hematology and Oncology

## 2021-12-30 ENCOUNTER — Inpatient Hospital Stay (INDEPENDENT_AMBULATORY_CARE_PROVIDER_SITE_OTHER): Payer: Medicaid Other | Admitting: Hematology and Oncology

## 2021-12-30 ENCOUNTER — Other Ambulatory Visit: Payer: Self-pay

## 2021-12-30 VITALS — BP 145/75 | HR 77 | Temp 98.7°F | Resp 16 | Ht 69.1 in | Wt 237.8 lb

## 2021-12-30 DIAGNOSIS — C109 Malignant neoplasm of oropharynx, unspecified: Secondary | ICD-10-CM | POA: Diagnosis not present

## 2021-12-30 MED ORDER — PROCHLORPERAZINE MALEATE 10 MG PO TABS: 10.0000 mg | ORAL_TABLET | Freq: Four times a day (QID) | ORAL | 1 refills | Status: DC | PRN

## 2021-12-30 MED ORDER — ONDANSETRON HCL 8 MG PO TABS: 8.0000 mg | ORAL_TABLET | Freq: Three times a day (TID) | ORAL | 1 refills | Status: DC | PRN

## 2022-01-05 ENCOUNTER — Other Ambulatory Visit: Payer: Self-pay

## 2022-01-05 MED FILL — Dexamethasone Sodium Phosphate Inj 100 MG/10ML: INTRAMUSCULAR | Qty: 1 | Status: AC

## 2022-01-05 MED FILL — Paclitaxel IV Conc 300 MG/50ML (6 MG/ML): INTRAVENOUS | Qty: 17 | Status: AC

## 2022-01-05 MED FILL — Carboplatin IV Soln 450 MG/45ML: INTRAVENOUS | Qty: 30 | Status: AC

## 2022-01-06 ENCOUNTER — Inpatient Hospital Stay: Payer: Medicaid Other | Attending: Hematology and Oncology

## 2022-01-06 ENCOUNTER — Encounter: Payer: Self-pay | Admitting: Oncology

## 2022-01-06 VITALS — BP 122/67 | HR 59 | Temp 98.5°F | Resp 18 | Ht 69.1 in | Wt 237.1 lb

## 2022-01-06 DIAGNOSIS — C109 Malignant neoplasm of oropharynx, unspecified: Secondary | ICD-10-CM | POA: Diagnosis present

## 2022-01-06 DIAGNOSIS — Z5111 Encounter for antineoplastic chemotherapy: Secondary | ICD-10-CM | POA: Insufficient documentation

## 2022-01-06 DIAGNOSIS — Z79899 Other long term (current) drug therapy: Secondary | ICD-10-CM | POA: Insufficient documentation

## 2022-01-06 DIAGNOSIS — Z87891 Personal history of nicotine dependence: Secondary | ICD-10-CM | POA: Diagnosis not present

## 2022-01-06 MED ORDER — SODIUM CHLORIDE 0.9 % IV SOLN
10.0000 mg | Freq: Once | INTRAVENOUS | Status: AC
  Administered 2022-01-06: 10 mg via INTRAVENOUS
  Filled 2022-01-06: qty 1

## 2022-01-06 MED ORDER — SODIUM CHLORIDE 0.9 % IV SOLN
300.0000 mg | Freq: Once | INTRAVENOUS | Status: AC
  Administered 2022-01-06: 300 mg via INTRAVENOUS
  Filled 2022-01-06: qty 30

## 2022-01-06 MED ORDER — SODIUM CHLORIDE 0.9 % IV SOLN
45.0000 mg/m2 | Freq: Once | INTRAVENOUS | Status: AC
  Administered 2022-01-06: 102 mg via INTRAVENOUS
  Filled 2022-01-06: qty 17

## 2022-01-06 MED ORDER — HEPARIN SOD (PORK) LOCK FLUSH 100 UNIT/ML IV SOLN
500.0000 [IU] | Freq: Once | INTRAVENOUS | Status: AC | PRN
  Administered 2022-01-06: 500 [IU]

## 2022-01-06 MED ORDER — SODIUM CHLORIDE 0.9 % IV SOLN: Freq: Once | INTRAVENOUS | Status: AC

## 2022-01-06 MED ORDER — FAMOTIDINE IN NACL 20-0.9 MG/50ML-% IV SOLN
20.0000 mg | Freq: Once | INTRAVENOUS | Status: AC
  Administered 2022-01-06: 20 mg via INTRAVENOUS
  Filled 2022-01-06: qty 50

## 2022-01-06 MED ORDER — SODIUM CHLORIDE 0.9% FLUSH
10.0000 mL | INTRAVENOUS | Status: DC | PRN
  Administered 2022-01-06: 10 mL

## 2022-01-06 MED ORDER — PALONOSETRON HCL INJECTION 0.25 MG/5ML
0.2500 mg | Freq: Once | INTRAVENOUS | Status: AC
  Administered 2022-01-06: 0.25 mg via INTRAVENOUS
  Filled 2022-01-06: qty 5

## 2022-01-06 MED ORDER — DIPHENHYDRAMINE HCL 50 MG/ML IJ SOLN
50.0000 mg | Freq: Once | INTRAMUSCULAR | Status: AC
  Administered 2022-01-06: 50 mg via INTRAVENOUS
  Filled 2022-01-06: qty 1

## 2022-01-06 NOTE — Patient Instructions (Signed)
Carboplatin Injection What is this medication? CARBOPLATIN (KAR boe pla tin) treats some types of cancer. It works by slowing down the growth of cancer cells. This medicine may be used for other purposes; ask your health care provider or pharmacist if you have questions. COMMON BRAND NAME(S): Paraplatin What should I tell my care team before I take this medication? They need to know if you have any of these conditions: Blood disorders Hearing problems Kidney disease Recent or ongoing radiation therapy An unusual or allergic reaction to carboplatin, cisplatin, other medications, foods, dyes, or preservatives Pregnant or trying to get pregnant Breast-feeding How should I use this medication? This medication is injected into a vein. It is given by your care team in a hospital or clinic setting. Talk to your care team about the use of this medication in children. Special care may be needed. Overdosage: If you think you have taken too much of this medicine contact a poison control center or emergency room at once. NOTE: This medicine is only for you. Do not share this medicine with others. What if I miss a dose? Keep appointments for follow-up doses. It is important not to miss your dose. Call your care team if you are unable to keep an appointment. What may interact with this medication? Medications for seizures Some antibiotics, such as amikacin, gentamicin, neomycin, streptomycin, tobramycin Vaccines This list may not describe all possible interactions. Give your health care provider a list of all the medicines, herbs, non-prescription drugs, or dietary supplements you use. Also tell them if you smoke, drink alcohol, or use illegal drugs. Some items may interact with your medicine. What should I watch for while using this medication? Your condition will be monitored carefully while you are receiving this medication. You may need blood work while taking this medication. This medication may  make you feel generally unwell. This is not uncommon, as chemotherapy can affect healthy cells as well as cancer cells. Report any side effects. Continue your course of treatment even though you feel ill unless your care team tells you to stop. In some cases, you may be given additional medications to help with side effects. Follow all directions for their use. This medication may increase your risk of getting an infection. Call your care team for advice if you get a fever, chills, sore throat, or other symptoms of a cold or flu. Do not treat yourself. Try to avoid being around people who are sick. Avoid taking medications that contain aspirin, acetaminophen, ibuprofen, naproxen, or ketoprofen unless instructed by your care team. These medications may hide a fever. Be careful brushing or flossing your teeth or using a toothpick because you may get an infection or bleed more easily. If you have any dental work done, tell your dentist you are receiving this medication. Talk to your care team if you wish to become pregnant or think you might be pregnant. This medication can cause serious birth defects. Talk to your care team about effective forms of contraception. Do not breast-feed while taking this medication. What side effects may I notice from receiving this medication? Side effects that you should report to your care team as soon as possible: Allergic reactions--skin rash, itching, hives, swelling of the face, lips, tongue, or throat Infection--fever, chills, cough, sore throat, wounds that don't heal, pain or trouble when passing urine, general feeling of discomfort or being unwell Low red blood cell level--unusual weakness or fatigue, dizziness, headache, trouble breathing Pain, tingling, or numbness in the hands or   feet, muscle weakness, change in vision, confusion or trouble speaking, loss of balance or coordination, trouble walking, seizures Unusual bruising or bleeding Side effects that usually  do not require medical attention (report to your care team if they continue or are bothersome): Hair loss Nausea Unusual weakness or fatigue Vomiting This list may not describe all possible side effects. Call your doctor for medical advice about side effects. You may report side effects to FDA at 1-800-FDA-1088. Where should I keep my medication? This medication is given in a hospital or clinic. It will not be stored at home. NOTE: This sheet is a summary. It may not cover all possible information. If you have questions about this medicine, talk to your doctor, pharmacist, or health care provider.  2023 [employer]/Gold Standard (2021-07-14 00:00:00) Paclitaxel Injection What is this medication? PACLITAXEL (PAK li TAX el) treats some types of cancer. It works by slowing down the growth of cancer cells. This medicine may be used for other purposes; ask your health care provider or pharmacist if you have questions. COMMON BRAND NAME(S): Onxol, Taxol What should I tell my care team before I take this medication? They need to know if you have any of these conditions: Heart disease Liver disease Low white blood cell levels An unusual or allergic reaction to paclitaxel, other medications, foods, dyes, or preservatives If you or your partner are pregnant or trying to get pregnant Breast-feeding How should I use this medication? This medication is injected into a vein. It is given by your care team in a hospital or clinic setting. Talk to your care team about the use of this medication in children. While it may be given to children for selected conditions, precautions do apply. Overdosage: If you think you have taken too much of this medicine contact a poison control center or emergency room at once. NOTE: This medicine is only for you. Do not share this medicine with others. What if I miss a dose? Keep appointments for follow-up doses. It is important not to miss your dose. Call your care team if  you are unable to keep an appointment. What may interact with this medication? Do not take this medication with any of the following: Live virus vaccines Other medications may affect the way this medication works. Talk with your care team about all of the medications you take. They may suggest changes to your treatment plan to lower the risk of side effects and to make sure your medications work as intended. This list may not describe all possible interactions. Give your health care provider a list of all the medicines, herbs, non-prescription drugs, or dietary supplements you use. Also tell them if you smoke, drink alcohol, or use illegal drugs. Some items may interact with your medicine. What should I watch for while using this medication? Your condition will be monitored carefully while you are receiving this medication. You may need blood work while taking this medication. This medication may make you feel generally unwell. This is not uncommon as chemotherapy can affect healthy cells as well as cancer cells. Report any side effects. Continue your course of treatment even though you feel ill unless your care team tells you to stop. This medication can cause serious allergic reactions. To reduce the risk, your care team may give you other medications to take before receiving this one. Be sure to follow the directions from your care team. This medication may increase your risk of getting an infection. Call your care team for advice if   you get a fever, chills, sore throat, or other symptoms of a cold or flu. Do not treat yourself. Try to avoid being around people who are sick. This medication may increase your risk to bruise or bleed. Call your care team if you notice any unusual bleeding. Be careful brushing or flossing your teeth or using a toothpick because you may get an infection or bleed more easily. If you have any dental work done, tell your dentist you are receiving this medication. Talk to  your care team if you may be pregnant. Serious birth defects can occur if you take this medication during pregnancy. Talk to your care team before breastfeeding. Changes to your treatment plan may be needed. What side effects may I notice from receiving this medication? Side effects that you should report to your care team as soon as possible: Allergic reactions--skin rash, itching, hives, swelling of the face, lips, tongue, or throat Heart rhythm changes--fast or irregular heartbeat, dizziness, feeling faint or lightheaded, chest pain, trouble breathing Increase in blood pressure Infection--fever, chills, cough, sore throat, wounds that don't heal, pain or trouble when passing urine, general feeling of discomfort or being unwell Low blood pressure--dizziness, feeling faint or lightheaded, blurry vision Low red blood cell level--unusual weakness or fatigue, dizziness, headache, trouble breathing Painful swelling, warmth, or redness of the skin, blisters or sores at the infusion site Pain, tingling, or numbness in the hands or feet Slow heartbeat--dizziness, feeling faint or lightheaded, confusion, trouble breathing, unusual weakness or fatigue Unusual bruising or bleeding Side effects that usually do not require medical attention (report to your care team if they continue or are bothersome): Diarrhea Hair loss Joint pain Loss of appetite Muscle pain Nausea Vomiting This list may not describe all possible side effects. Call your doctor for medical advice about side effects. You may report side effects to FDA at 1-800-FDA-1088. Where should I keep my medication? This medication is given in a hospital or clinic. It will not be stored at home. NOTE: This sheet is a summary. It may not cover all possible information. If you have questions about this medicine, talk to your doctor, pharmacist, or health care provider.  2023 [employer]/Gold Standard (2021-08-06 00:00:00)  

## 2022-01-07 ENCOUNTER — Telehealth: Payer: Self-pay

## 2022-01-07 ENCOUNTER — Ambulatory Visit: Payer: Managed Care, Other (non HMO)

## 2022-01-07 ENCOUNTER — Encounter: Payer: Self-pay | Admitting: Oncology

## 2022-01-07 NOTE — Telephone Encounter (Signed)
I spoke with pt. He states he is doing ok since his first chemo infusion yesterday. He denies N/V, chest pain, SOB, constipation, diarrhea, fever and skin rashes/irritation. I instructed pt that the Taxol can cause constipation. He has senokot and will use as needed. He normally has a BM daily. I discussed that there is a possibility he may notice tingling in hands and feet @ some point. He understands to report that to [place]. I reminded him to call us if he develops temp of 100.4 or higher, day or night, weekends, or holidays. He verbalized understanding.

## 2022-01-11 ENCOUNTER — Telehealth: Payer: Self-pay

## 2022-01-11 NOTE — Telephone Encounter (Signed)
Pt is aggravated and anxious about his pain from neck radiating to his shoulder. He is starting to have trouble turning his head. He is using hydrocodone, but doesn't help. He wants to speak with you before radiation starts. I told them you have a full schedule and I am unsure when you would get a chance to call him. Message sent to Dr Hinton Rao @ [phone].

## 2022-01-12 ENCOUNTER — Inpatient Hospital Stay (INDEPENDENT_AMBULATORY_CARE_PROVIDER_SITE_OTHER): Payer: Medicaid Other | Admitting: Hematology and Oncology

## 2022-01-12 ENCOUNTER — Inpatient Hospital Stay: Payer: Medicaid Other

## 2022-01-12 ENCOUNTER — Encounter: Payer: Self-pay | Admitting: Hematology and Oncology

## 2022-01-12 ENCOUNTER — Encounter: Payer: Self-pay | Admitting: Oncology

## 2022-01-12 ENCOUNTER — Other Ambulatory Visit: Payer: Self-pay | Admitting: Pharmacist

## 2022-01-12 VITALS — BP 129/65 | HR 68 | Temp 99.0°F | Resp 20 | Ht 69.1 in | Wt 235.8 lb

## 2022-01-12 DIAGNOSIS — C109 Malignant neoplasm of oropharynx, unspecified: Secondary | ICD-10-CM

## 2022-01-12 DIAGNOSIS — K1120 Sialoadenitis, unspecified: Secondary | ICD-10-CM

## 2022-01-12 LAB — COMPREHENSIVE METABOLIC PANEL
Albumin: 4.2 (ref 3.5–5.0)
Calcium: 9.7 (ref 8.7–10.7)

## 2022-01-12 LAB — CBC AND DIFFERENTIAL
HCT: 37 — AB (ref 41–53)
Hemoglobin: 13 — AB (ref 13.5–17.5)
Neutrophils Absolute: 3.36
Platelets: 230 10*3/uL (ref 150–400)
WBC: 4.8

## 2022-01-12 LAB — HEPATIC FUNCTION PANEL
ALT: 28 U/L (ref 10–40)
AST: 26 (ref 14–40)
Alkaline Phosphatase: 72 (ref 25–125)
Bilirubin, Total: 0.2

## 2022-01-12 LAB — BASIC METABOLIC PANEL
BUN: 20 (ref 4–21)
CO2: 25 — AB (ref 13–22)
Chloride: 103 (ref 99–108)
Creatinine: 0.6 (ref 0.6–1.3)
Glucose: 131
Potassium: 3.9 mEq/L (ref 3.5–5.1)
Sodium: 135 — AB (ref 137–147)

## 2022-01-12 LAB — CBC: RBC: 3.95 (ref 3.87–5.11)

## 2022-01-12 MED ORDER — AMOXICILLIN-POT CLAVULANATE 875-125 MG PO TABS: 1.0000 | ORAL_TABLET | Freq: Two times a day (BID) | ORAL | 0 refills | Status: DC

## 2022-01-12 MED FILL — Paclitaxel IV Conc 300 MG/50ML (6 MG/ML): INTRAVENOUS | Qty: 17 | Status: AC

## 2022-01-12 MED FILL — Carboplatin IV Soln 450 MG/45ML: INTRAVENOUS | Qty: 30 | Status: AC

## 2022-01-12 NOTE — Progress Notes (Addendum)
Ian Case  [address] [place],  [place]  [address] [phone]  Clinic Day:  01/12/2022  Referring physician: [name], MD  ASSESSMENT & PLAN:   Assessment & Plan: Oropharyngeal carcinoma ([place]) Stage IVB oropharyngeal carcinoma, his left tongue and left tonsil, p16 negative.  He is status post radical surgical resection.  He had several concerning features on his pathology, including 1 positive node with extranodal extension and focal lymphovascular invasion.  We will plan for concurrent chemoradiation with weekly carboplatin/paclitaxel.  He had his first cycle of chemotherapy last week, but did not get his radiation started.  He has had chronic pain of the left lower jaw since surgery, for which she is on multiple medications without significant change.  He now reports pain of the left upper jaw with swelling of his face.  Examination is consistent with parotiditis.  Due to this, we will hold further chemoradiation for a week and treat him with Augmentin 875 twice daily for 10 days.  We will plan to see him back in 1 week prior to potentially resuming chemotherapy and starting radiation therapy.  Parotiditis New onset right upper jaw pain and swelling, worsening with eating.  Examination is consistent with parotiditis.  There is no evidence of recurrent disease.  We will hold treatment this week and place him on Augmentin 875 mg twice daily for 10 days.  He knows to contact our office if he is not feeling better by the end of the week.  If he does not improve, we will proceed with CT imaging.  The pain in the lower neck and shoulder is felt to be musculoskeletal in nature.  We well plan to see him back in 1 week for repeat clinical assessment.    The patient understands the plans discussed today and is in agreement with them.  He knows to contact our office if he develops concerns prior to his next appointment.   I provided 30 minutes of  face-to-face time during this encounter and > 50% was spent counseling as documented under my assessment and plan.    [name], PA-C  [facility] [address] [place] [address] Dept: [phone] Dept Fax: [phone]   No orders of the defined types were placed in this encounter.     CHIEF COMPLAINT:  CC: Stage IVB oropharyngeal cancer  Current Treatment: Adjuvant chemoradiation with weekly carboplatin/paclitaxel  HISTORY OF PRESENT ILLNESS:   Oncology History  Oropharyngeal carcinoma ([place])  11/06/2021 Initial Diagnosis   Oropharyngeal carcinoma ([place])   11/06/2021 Cancer Staging   Staging form: Pharynx - P16 Negative Oropharynx, AJCC 8th Edition - Clinical stage from 11/06/2021: Stage IVB (cT1, cN3, cM0, p16-) - Signed by [name], MD on 12/28/2021 Histopathologic type: Squamous cell carcinoma, NOS Stage prefix: Initial diagnosis Histologic grade (G): G2 Histologic grading system: 4 grade system Tumor size (mm): 13 Lymph-vascular invasion (LVI): LVI present/identified, NOS Diagnostic confirmation: Positive histology Specimen type: Excision Staged by: Managing physician Presence of extranodal extension: Present Extent of extranodal extension: Microscopic ENE(+) Distance of extension from the native lymph node capsule to the farthest point of invasion in the extranodal tissue (mm): 6.5 ECOG performance status: Grade 1 Perineural invasion (PNI): Absent Prognostic indicators: 1.3 cm with 1/23 nodes positive -2.3 cm with ENE and focal LVI Stage used in treatment planning: Yes National guidelines used in treatment planning: Yes Type of national guideline used in treatment  planning: NCCN   01/06/2022 -  Chemotherapy   Patient is on Treatment Plan : HEAD/NECK Carboplatin + Paclitaxel + XRT q7d         INTERVAL HISTORY:  Ian Case is here today for repeat clinical assessment.  He received his  first cycle of carboplatin/paclitaxel last week, but has not started radiation and does not wish to do so due to his pain in his left face, neck and shoulder.  He had left lower jaw pain post operatively.  He now reports pain and swelling of the left upper jaw.  He is also having pain in the left neck and shoulder.  He denies fevers or chills. He denies fevers or chills. He denies pain. His appetite is good. His weight has decreased 2 pounds over last week .  REVIEW OF SYSTEMS:  Review of Systems  Constitutional:  Positive for fatigue. Negative for appetite change, chills, fever and unexpected weight change.  HENT:   Negative for lump/mass, mouth sores and sore throat.   Respiratory:  Negative for cough and shortness of breath.   Cardiovascular:  Negative for chest pain and leg swelling.  Gastrointestinal:  Negative for abdominal pain, constipation, diarrhea, nausea and vomiting.  Genitourinary:  Negative for difficulty urinating, dysuria, frequency and hematuria.   Musculoskeletal:  Positive for arthralgias and neck pain. Negative for back pain and myalgias.  Skin:  Negative for itching, rash and wound.  Neurological:  Negative for dizziness, extremity weakness, headaches, light-headedness and numbness.  Hematological:  Negative for adenopathy.  Psychiatric/Behavioral:  Negative for depression and sleep disturbance. The patient is not nervous/anxious.      VITALS:  Blood pressure 129/65, pulse 68, temperature 99 F (37.2 C), temperature source Oral, resp. rate 20, height 5' 9.1" (1.755 m), weight 235 lb 12.8 oz (107 kg), SpO2 97 %.  Wt Readings from Last 3 Encounters:  01/12/22 235 lb 12.8 oz (107 kg)  01/06/22 237 lb 1.3 oz (107.5 kg)  12/30/21 237 lb 12.8 oz (107.9 kg)    Body mass index is 34.72 kg/m.  Performance status (ECOG): 1 - Symptomatic but completely ambulatory  PHYSICAL EXAM:  Physical Exam Vitals and nursing note reviewed.  Constitutional:      General: He is not in  acute distress.    Appearance: Normal appearance. He is normal weight.  HENT:     Head: Normocephalic and atraumatic.     Salivary Glands: Left salivary gland is diffusely enlarged and tender.     Comments: Swollen and tender left parotid gland with overlying mild erythema and warmth.    Right Ear: There is impacted cerumen.     Left Ear: A middle ear effusion is present.     Mouth/Throat:     Mouth: Mucous membranes are moist.     Tongue: No lesions.     Pharynx: Oropharynx is clear. No oropharyngeal exudate or posterior oropharyngeal erythema.     Comments: There is swelling of the left parotid intraorally, with visible duct, but no drainage or obvious stone.  There are no palpable or visual lesions of the tongue, floor of mouth or posterior pharynx. Eyes:     General: No scleral icterus.    Extraocular Movements: Extraocular movements intact.     Conjunctiva/sclera: Conjunctivae normal.     Pupils: Pupils are equal, round, and reactive to light.  Cardiovascular:     Rate and Rhythm: Normal rate and regular rhythm.     Heart sounds: Normal heart sounds. No murmur heard.  No friction rub. No gallop.  Pulmonary:     Effort: Pulmonary effort is normal.     Breath sounds: Normal breath sounds. No wheezing, rhonchi or rales.  Abdominal:     General: Bowel sounds are normal. There is no distension.     Palpations: Abdomen is soft. There is no hepatomegaly, splenomegaly or mass.     Tenderness: There is no abdominal tenderness.  Musculoskeletal:        General: Normal range of motion.     Cervical back: Normal range of motion and neck supple. No tenderness.     Right lower leg: No edema.     Left lower leg: No edema.     Comments: There is muscle spasm and tenderness in the left trapezius  Lymphadenopathy:     Cervical: No cervical adenopathy.     Upper Body:     Right upper body: No supraclavicular or axillary adenopathy.     Left upper body: No supraclavicular or axillary  adenopathy.     Lower Body: No right inguinal adenopathy.  Skin:    General: Skin is warm and dry.     Coloration: Skin is not jaundiced.     Findings: No rash.  Neurological:     Mental Status: He is alert and oriented to person, place, and time.     Cranial Nerves: No cranial nerve deficit.  Psychiatric:        Mood and Affect: Mood normal.        Behavior: Behavior normal.        Thought Content: Thought content normal.   LABS:      Latest Ref Rng & Units 12/29/2021   12:00 AM 12/03/2021   12:00 AM 12/25/2017    7:13 PM  CBC  WBC  6.5     5.4       Hemoglobin 13.5 - 17.5 13.1     13.1     18.7   Hematocrit 41 - 53 38     38     55.0   Platelets 150 - 400 K/uL 276     226          This result is from an external source.      Latest Ref Rng & Units 12/29/2021   12:00 AM 12/03/2021   12:00 AM 12/25/2017    7:13 PM  CMP  Glucose 70 - 99 mg/dL   97   BUN 4 - '21 19     16     24   '$ Creatinine 0.6 - 1.3 0.6     0.6     0.80   Sodium 137 - 147 136     136     131   Potassium 3.5 - 5.1 mEq/L 3.7     3.2     4.2   Chloride 99 - 108 103     100     100   CO2 13 - '22 29     30       '$ Calcium 8.7 - 10.7 10.0     9.4       Alkaline Phos 25 - 125 59     62       AST 14 - 40 22     28       ALT 10 - 40 U/L 23     32          This result is from an external source.  No results found for: "CEA1", "CEA" / No results found for: "CEA1", "CEA" No results found for: "PSA1" No results found for: "NGE952" No results found for: "CAN125"  No results found for: "TOTALPROTELP", "ALBUMINELP", "A1GS", "A2GS", "BETS", "BETA2SER", "GAMS", "MSPIKE", "SPEI" No results found for: "TIBC", "FERRITIN", "IRONPCTSAT" No results found for: "LDH"  STUDIES:  No results found.    HISTORY:   Past Medical History:  Diagnosis Date  . Arthritis   . Back pain   . GERD (gastroesophageal reflux disease)   . History of kidney stones   . Hypertension   . Sleep apnea    does not have cpap yet     Past Surgical History:  Procedure Laterality Date  . APPENDECTOMY    . HAND SURGERY    . LUMBAR LAMINECTOMY/DECOMPRESSION MICRODISCECTOMY N/A 12/28/2017   Procedure: Laminectomy and Foraminotomy Lumbar two-three-Lumbar three-four - Lumbar four-five;  Surgeon: Kary Kos, MD;  Location: [facility];  Service: Neurosurgery;  Laterality: N/A;  . OTHER SURGICAL HISTORY     removal on cancer from neck    Family History  Problem Relation Age of Onset  . Thyroid disease Mother   . Heart disease Father   . Prostate cancer Brother   . Healthy Daughter   . Healthy Son     Social History:  reports that he has been smoking cigarettes. He has a 22.00 pack-year smoking history. He has never used smokeless tobacco. He reports that he does not currently use alcohol. He reports that he does not use drugs.The patient is accompanied by his wife today.  Allergies: No Known Allergies  Current Medications: Current Outpatient Medications  Medication Sig Dispense Refill  . amoxicillin-clavulanate (AUGMENTIN) 875-125 MG tablet Take 1 tablet by mouth 2 (two) times daily. 20 tablet 0  . amitriptyline (ELAVIL) 75 MG tablet Take 1 tablet by mouth daily.    Marland Kitchen amLODipine (NORVASC) 10 MG tablet Take 1 tablet by mouth every morning.    Marland Kitchen aspirin EC 81 MG tablet Take by mouth.    Marland Kitchen atorvastatin (LIPITOR) 80 MG tablet Take 80 mg by mouth daily.    . chlorhexidine (PERIDEX) 0.12 % solution SMARTSIG:1 Capful(s) By Mouth Twice Daily    . dexamethasone 0.5 MG/5ML elixir Take by mouth.    . fenofibrate 54 MG tablet TAKE ONE TABLET BY MOUTH EVERY DAY FOR 30 DAYS    . gabapentin (NEURONTIN) 800 MG tablet Take 800 mg by mouth 4 (four) times daily.    Marland Kitchen gentamicin (GARAMYCIN) 0.3 % ophthalmic solution Apply to eye.    Marland Kitchen HYDROcodone-acetaminophen (NORCO) 10-325 MG tablet Take by mouth.    Marland Kitchen ibuprofen (ADVIL) 800 MG tablet Take by mouth.    Marland Kitchen ketorolac (TORADOL) 10 MG tablet Take 10 mg by mouth every 6 (six) hours as  needed.    . lidocaine (XYLOCAINE) 2 % solution SMARTSIG:15 Milliliter(s) By Mouth Every 3 Hours PRN    . magic mouthwash SOLN Take 15 mLs by mouth in the morning, at noon, in the evening, and at bedtime.    . nitroGLYCERIN (NITROSTAT) 0.4 MG SL tablet PLACE 1 TABLET UNDER THE TONGUE EVERY 5 MIN FOR CHEST PAIN AS NEEDED. NO MORE THAN 3 TABS IN 15 MIN.    . nystatin (MYCOSTATIN) 100000 UNIT/ML suspension Swish and swallow 4 (four) times daily    . ondansetron (ZOFRAN) 8 MG tablet Take 1 tablet (8 mg total) by mouth every 8 (eight) hours as needed for nausea or vomiting. Start on the third  day after chemotherapy. 30 tablet 1  . oxyCODONE-acetaminophen (PERCOCET/ROXICET) 5-325 MG tablet Take 1-2 tablets by mouth every 4 (four) hours as needed for severe pain. 15 tablet 0  . pantoprazole (PROTONIX) 40 MG tablet Take 1 tablet by mouth daily.    . phentermine 37.5 MG capsule Take by mouth. (Patient not taking: Reported on 01/12/2022)    . prednisoLONE acetate (PRED FORTE) 1 % ophthalmic suspension STARTING AFTER surgery, instill ONE drop by opthalmic route FOUR TIMES DAILY FOR ONE WEEK THEN THREE TIMES DAILY FOR ONE WEEK THEN TWICE DAILY FOR ONE WEEK THEN daily FOR ONE WEEK in THE right IN THE AFFECTED EYE    . predniSONE (DELTASONE) 20 MG tablet Take 20 mg by mouth 2 (two) times daily. (Patient not taking: Reported on 01/12/2022)    . prochlorperazine (COMPAZINE) 10 MG tablet Take 1 tablet (10 mg total) by mouth every 6 (six) hours as needed for nausea or vomiting. 30 tablet 1  . senna-docusate (SENOKOT-S) 8.6-50 MG tablet Take by mouth.    Marland Kitchen tiZANidine (ZANAFLEX) 4 MG tablet Take by mouth.    . triamterene-hydrochlorothiazide (MAXZIDE-25) 37.5-25 MG tablet Take 1 tablet by mouth daily.    . varenicline (CHANTIX) 0.5 MG tablet Take 0.5 mg by mouth 2 (two) times daily. (Patient not taking: Reported on 01/12/2022)     No current facility-administered medications for this visit.

## 2022-01-13 ENCOUNTER — Encounter: Payer: Self-pay | Admitting: Oncology

## 2022-01-13 ENCOUNTER — Encounter: Payer: Self-pay | Admitting: Hematology and Oncology

## 2022-01-13 ENCOUNTER — Ambulatory Visit: Payer: Managed Care, Other (non HMO)

## 2022-01-13 DIAGNOSIS — K1120 Sialoadenitis, unspecified: Secondary | ICD-10-CM | POA: Insufficient documentation

## 2022-01-13 NOTE — Assessment & Plan Note (Addendum)
New onset right upper jaw pain and swelling, worsening with eating.  Examination is consistent with parotiditis.  There is no evidence of recurrent disease.  We will hold treatment this week and place him on Augmentin 875 mg twice daily for 10 days.  He knows to contact our office if he is not feeling better by the end of the week.  If he does not improve, we will proceed with CT imaging.  The pain in the lower neck and shoulder is felt to be musculoskeletal in nature.  We well plan to see him back in 1 week for repeat clinical assessment.

## 2022-01-13 NOTE — Assessment & Plan Note (Signed)
Stage IVB oropharyngeal carcinoma, his left tongue and left tonsil, p16 negative.  He is status post radical surgical resection.  He had several concerning features on his pathology, including 1 positive node with extranodal extension and focal lymphovascular invasion.  We will plan for concurrent chemoradiation with weekly carboplatin/paclitaxel.  He had his first cycle of chemotherapy last week, but did not get his radiation started.  He has had chronic pain of the left lower jaw since surgery, for which she is on multiple medications without significant change.  He now reports pain of the left upper jaw with swelling of his face.  Examination is consistent with parotiditis.  Due to this, we will hold further chemoradiation for a week and treat him with Augmentin 875 twice daily for 10 days.  We will plan to see him back in 1 week prior to potentially resuming chemotherapy and starting radiation therapy.

## 2022-01-15 ENCOUNTER — Telehealth: Payer: Self-pay

## 2022-01-15 ENCOUNTER — Other Ambulatory Visit: Payer: Self-pay | Admitting: Oncology

## 2022-01-15 DIAGNOSIS — K1120 Sialoadenitis, unspecified: Secondary | ICD-10-CM

## 2022-01-15 MED ORDER — CLINDAMYCIN HCL 300 MG PO CAPS: 300.0000 mg | ORAL_CAPSULE | Freq: Four times a day (QID) | ORAL | 0 refills | Status: AC

## 2022-01-15 NOTE — Telephone Encounter (Signed)
Spoke with Tammy patients wife she voiced understanding, will stop current antibiotic, start new antibiotic, keep appt for Monday. She has access to on call providers after hours and will seek care at the Emergency room for any increased swelling that may affect his breathing.

## 2022-01-15 NOTE — Telephone Encounter (Signed)
-----   Message from [name], MD sent at 01/15/2022 10:50 AM EDT ----- Regarding: call Tell him I called my local ENT surgeon and he feels a different ab will be more successful. I will send that in, stop the other.  He is to use warm compresses and lots of hydration. Then keep appt with [place] Monday - if not better, we will do CT scan

## 2022-01-18 ENCOUNTER — Inpatient Hospital Stay (INDEPENDENT_AMBULATORY_CARE_PROVIDER_SITE_OTHER): Payer: Medicaid Other | Admitting: Hematology and Oncology

## 2022-01-18 ENCOUNTER — Telehealth: Payer: Self-pay

## 2022-01-18 ENCOUNTER — Encounter: Payer: Self-pay | Admitting: Hematology and Oncology

## 2022-01-18 ENCOUNTER — Inpatient Hospital Stay: Payer: Medicaid Other

## 2022-01-18 ENCOUNTER — Telehealth: Payer: Self-pay | Admitting: Hematology and Oncology

## 2022-01-18 VITALS — BP 142/72 | HR 65 | Temp 98.7°F | Resp 18 | Ht 69.1 in | Wt 243.2 lb

## 2022-01-18 DIAGNOSIS — C109 Malignant neoplasm of oropharynx, unspecified: Secondary | ICD-10-CM

## 2022-01-18 DIAGNOSIS — M542 Cervicalgia: Secondary | ICD-10-CM

## 2022-01-18 DIAGNOSIS — R519 Headache, unspecified: Secondary | ICD-10-CM | POA: Diagnosis not present

## 2022-01-18 LAB — CBC AND DIFFERENTIAL
HCT: 38 — AB (ref 41–53)
Hemoglobin: 13.1 — AB (ref 13.5–17.5)
MCV: 95 — AB (ref 80–94)
Neutrophils Absolute: 1.72
Platelets: 232 10*3/uL (ref 150–400)
WBC: 4.2

## 2022-01-18 LAB — BASIC METABOLIC PANEL
BUN: 22 — AB (ref 4–21)
CO2: 28 — AB (ref 13–22)
Chloride: 104 (ref 99–108)
Creatinine: 0.7 (ref 0.6–1.3)
Glucose: 94
Potassium: 4 mEq/L (ref 3.5–5.1)
Sodium: 136 — AB (ref 137–147)

## 2022-01-18 LAB — HEPATIC FUNCTION PANEL
ALT: 74 U/L — AB (ref 10–40)
AST: 58 — AB (ref 14–40)
Alkaline Phosphatase: 104 (ref 25–125)
Bilirubin, Total: 0.7

## 2022-01-18 LAB — CBC: RBC: 3.99 (ref 3.87–5.11)

## 2022-01-18 LAB — COMPREHENSIVE METABOLIC PANEL
Albumin: 4.2 (ref 3.5–5.0)
Calcium: 9.4 (ref 8.7–10.7)

## 2022-01-18 NOTE — Progress Notes (Signed)
[facility]  [address] [place],  [place]  [address] [phone]  Clinic Day:  01/18/2022  Referring physician: [name], MD  ASSESSMENT & PLAN:   Assessment & Plan: Oropharyngeal carcinoma ([place]) Stage IVB oropharyngeal carcinoma, his left tongue and left tonsil, p16 negative.  He is status post radical surgical resection with left partial glossectomy, partial pharyngectomy, radical tonsillectomy and neck dissection..  He had several concerning features on his pathology, including 1 positive node with extranodal extension and focal lymphovascular invasion.  We will plan for concurrent chemoradiation with weekly carboplatin/paclitaxel.  He had his first cycle of chemotherapy on October 4th, but did not get his radiation started.    Chemotherapy was held last week when he had worsening pain and swelling of the left face, which was felt to be parotiditis.  He was place on Augmentin 875 twice daily without improvement on Monday.  The antibiotic was switched to cleocin on Friday.  The patient states he has not had improvement with the cleocin either.  I will obtain a CT face and neck for further evaluation.  We will also refer him to local ENT, as he does not wish to return to his surgeon at [facility].  If this is not due to infection, this may be due to recurrent disease or possibly post surgical changes.  He is scheduled to see Dr. Orlene Erm in radiation tomorrow and resume chemotherapy on Wednesday.  Hopefully we can get imaging done tomorrow.  Regardless, I will see him back in 1 week for repeat clinical assessment.   The patient understands the plans discussed today and is in agreement with them.  He knows to contact our office if he develops concerns prior to his next appointment.   I provided 20 minutes of face-to-face time during this encounter and > 50% was spent counseling as documented under my assessment and plan.    [name], PA-C   [facility] [address] [place] [address] Dept: [phone] Dept Fax: [phone]   Orders Placed This Encounter  Procedures   CT MAXILLOFACIAL W & WO CONTRAST    Standing Status:   Future    Standing Expiration Date:   01/19/2023    Scheduling Instructions:     RH-next available    Order Specific Question:   If indicated for the ordered procedure, I authorize the administration of contrast media per Radiology protocol    Answer:   Yes    Order Specific Question:   Preferred imaging location?    Answer:   External   CT Soft Tissue Neck W Contrast    Standing Status:   Future    Standing Expiration Date:   01/18/2023    Scheduling Instructions:     RH-next available    Order Specific Question:   If indicated for the ordered procedure, I authorize the administration of contrast media per Radiology protocol    Answer:   Yes    Order Specific Question:   Preferred imaging location?    Answer:   External   CBC and differential    This external order was created through the Results Console.   CBC    This external order was created through the Results Console.   Basic metabolic panel    This external order was created through the Results Console.   Comprehensive metabolic panel    This external order was created through the  Results Console.   Hepatic function panel    This external order was created through the Results Console.      CHIEF COMPLAINT:  CC: Oropharynx cancer  Current Treatment: Concurrent chemoradiation with carboplatin/paclitaxel weekly  HISTORY OF PRESENT ILLNESS:   Oncology History  Oropharyngeal carcinoma ([place])  11/06/2021 Initial Diagnosis   Oropharyngeal carcinoma ([place])   11/06/2021 Cancer Staging   Staging form: Pharynx - P16 Negative Oropharynx, AJCC 8th Edition - Clinical stage from 11/06/2021: Stage IVB (cT1, cN3, cM0, p16-) - Signed by [name], MD on  12/28/2021 Histopathologic type: Squamous cell carcinoma, NOS Stage prefix: Initial diagnosis Histologic grade (G): G2 Histologic grading system: 4 grade system Tumor size (mm): 13 Lymph-vascular invasion (LVI): LVI present/identified, NOS Diagnostic confirmation: Positive histology Specimen type: Excision Staged by: Managing physician Presence of extranodal extension: Present Extent of extranodal extension: Microscopic ENE(+) Distance of extension from the native lymph node capsule to the farthest point of invasion in the extranodal tissue (mm): 6.5 ECOG performance status: Grade 1 Perineural invasion (PNI): Absent Prognostic indicators: 1.3 cm with 1/23 nodes positive -2.3 cm with ENE and focal LVI Stage used in treatment planning: Yes National guidelines used in treatment planning: Yes Type of national guideline used in treatment planning: NCCN   01/06/2022 -  Chemotherapy   Patient is on Treatment Plan : HEAD/NECK Carboplatin + Paclitaxel + XRT q7d         INTERVAL HISTORY:  Ian Case is here today for repeat clinical assessment prior to possibly resuming chemotherapy and starting radiation therapy.  Last week we felt he had parotiditis, but antibiotics have been ineffective.  He was initially started on Augmentin 875, then transitioned to Cleocin a few days later.  He had his persistent swelling and pain in the left face.  The pain is most severe when he first starts eating.  He continues to report pain in his right neck as well.  He is on multiple medications for back pain, including tizanidine, gabapentin and oxycodone for back pain.  None of these have been effective for his left face and neck pain. He denies fevers or chills.  His appetite is good. His weight has increased 8 pounds over last week .  He states he is scheduled to have an injection in his back at [facility] in the near future.  REVIEW OF SYSTEMS:  Review of Systems  Constitutional:  Negative for appetite change, chills,  fatigue, fever and unexpected weight change.  HENT:   Negative for lump/mass, mouth sores and sore throat.   Respiratory:  Negative for cough and shortness of breath.   Cardiovascular:  Negative for chest pain and leg swelling.  Gastrointestinal:  Negative for abdominal pain, constipation, diarrhea, nausea and vomiting.  Genitourinary:  Negative for difficulty urinating, dysuria, frequency and hematuria.   Musculoskeletal:  Positive for back pain and neck pain. Negative for arthralgias and myalgias.  Skin:  Negative for itching, rash and wound.  Neurological:  Negative for dizziness, extremity weakness, headaches, light-headedness and numbness.  Hematological:  Negative for adenopathy.  Psychiatric/Behavioral:  Negative for depression and sleep disturbance. The patient is not nervous/anxious.      VITALS:  Blood pressure (!) 142/72, pulse 65, temperature 98.7 F (37.1 C), temperature source Oral, resp. rate 18, height 5' 9.1" (1.755 m), weight 243 lb 3.2 oz (110.3 kg), SpO2 97 %.  Wt Readings from Last 3 Encounters:  01/18/22 243 lb 3.2 oz (110.3 kg)  01/12/22 235 lb 12.8 oz (107 kg)  01/06/22 237 lb 1.3 oz (107.5 kg)    Body mass index is 35.81 kg/m.  Performance status (ECOG): 1 - Symptomatic but completely ambulatory  PHYSICAL EXAM:  Physical Exam Vitals and nursing note reviewed.  Constitutional:      General: He is not in acute distress.    Appearance: Normal appearance. He is normal weight.  HENT:     Head: Normocephalic and atraumatic.     Salivary Glands: Left salivary gland is diffusely enlarged (persistent swelling of left parotid which remains tender, overlying erythema and warmth have resolved).     Mouth/Throat:     Mouth: Mucous membranes are moist. No oral lesions.     Tongue: No lesions.     Pharynx: Oropharynx is clear. No oropharyngeal exudate or posterior oropharyngeal erythema.     Comments: Persistent swelling of the parotid intraorally.  The duct is no  longer visible.  There are no palpable or visible lesions of the tongue or floor of mouth. Eyes:     General: No scleral icterus.    Extraocular Movements: Extraocular movements intact.     Conjunctiva/sclera: Conjunctivae normal.     Pupils: Pupils are equal, round, and reactive to light.  Cardiovascular:     Rate and Rhythm: Normal rate and regular rhythm.     Heart sounds: Normal heart sounds. No murmur heard.    No friction rub. No gallop.  Pulmonary:     Effort: Pulmonary effort is normal.     Breath sounds: Normal breath sounds. No wheezing, rhonchi or rales.  Abdominal:     General: Bowel sounds are normal. There is no distension.     Palpations: Abdomen is soft. There is no hepatomegaly, splenomegaly or mass.     Tenderness: There is no abdominal tenderness.  Musculoskeletal:        General: Normal range of motion.     Cervical back: Normal range of motion and neck supple. No tenderness.     Right lower leg: No edema.     Left lower leg: No edema.  Lymphadenopathy:     Cervical: No cervical adenopathy.     Upper Body:     Right upper body: No supraclavicular or axillary adenopathy.     Left upper body: No supraclavicular or axillary adenopathy.     Lower Body: No right inguinal adenopathy. No left inguinal adenopathy.  Skin:    General: Skin is warm and dry.     Coloration: Skin is not jaundiced.     Findings: No rash.  Neurological:     Mental Status: He is alert and oriented to person, place, and time.     Cranial Nerves: No cranial nerve deficit.  Psychiatric:        Mood and Affect: Mood normal.        Behavior: Behavior normal.        Thought Content: Thought content normal.     LABS:      Latest Ref Rng & Units 01/18/2022   12:00 AM 01/12/2022   12:00 AM 12/29/2021   12:00 AM  CBC  WBC  4.2     4.8     6.5      Hemoglobin 13.5 - 17.5 13.1     13.0     13.1      Hematocrit 41 - 53 38     37     38      Platelets 150 - 400 K/uL 232     230  276          This result is from an external source.      Latest Ref Rng & Units 01/18/2022   12:00 AM 01/12/2022   12:00 AM 12/29/2021   12:00 AM  CMP  BUN 4 - '21 22     20     19      '$ Creatinine 0.6 - 1.3 0.7     0.6     0.6      Sodium 137 - 147 136     135     136      Potassium 3.5 - 5.1 mEq/L 4.0     3.9     3.7      Chloride 99 - 108 104     103     103      CO2 13 - '22 28     25     29      '$ Calcium 8.7 - 10.7 9.4     9.7     10.0      Alkaline Phos 25 - 125 104     72     59      AST 14 - 40 58     26     22      ALT 10 - 40 U/L 74     28     23         This result is from an external source.     No results found for: "CEA1", "CEA" / No results found for: "CEA1", "CEA" No results found for: "PSA1" No results found for: "UVO536" No results found for: "CAN125"  No results found for: "TOTALPROTELP", "ALBUMINELP", "A1GS", "A2GS", "BETS", "BETA2SER", "GAMS", "MSPIKE", "SPEI" No results found for: "TIBC", "FERRITIN", "IRONPCTSAT" No results found for: "LDH"  STUDIES:  No results found.    HISTORY:   Past Medical History:  Diagnosis Date   Arthritis    Back pain    GERD (gastroesophageal reflux disease)    History of kidney stones    Hypertension    Sleep apnea    does not have cpap yet    Past Surgical History:  Procedure Laterality Date   APPENDECTOMY     HAND SURGERY     LUMBAR LAMINECTOMY/DECOMPRESSION MICRODISCECTOMY N/A 12/28/2017   Procedure: Laminectomy and Foraminotomy Lumbar two-three-Lumbar three-four - Lumbar four-five;  Surgeon: Kary Kos, MD;  Location: [facility];  Service: Neurosurgery;  Laterality: N/A;   OTHER SURGICAL HISTORY     removal on cancer from neck    Family History  Problem Relation Age of Onset   Thyroid disease Mother    Heart disease Father    Prostate cancer Brother    Healthy Daughter    Healthy Son     Social History:  reports that he has been smoking cigarettes. He has a 22.00 pack-year smoking history. He has never used  smokeless tobacco. He reports that he does not currently use alcohol. He reports that he does not use drugs.The patient is accompanied by his mother today.  Allergies: No Known Allergies  Current Medications: Current Outpatient Medications  Medication Sig Dispense Refill   amitriptyline (ELAVIL) 75 MG tablet Take 1 tablet by mouth daily.     amLODipine (NORVASC) 10 MG tablet Take 1 tablet by mouth every morning.     aspirin EC 81 MG tablet Take by mouth.     atorvastatin (LIPITOR) 80 MG tablet Take 80  mg by mouth daily.     chlorhexidine (PERIDEX) 0.12 % solution SMARTSIG:1 Capful(s) By Mouth Twice Daily     clindamycin (CLEOCIN) 300 MG capsule Take 1 capsule (300 mg total) by mouth 4 (four) times daily for 14 days. 56 capsule 0   dexamethasone 0.5 MG/5ML elixir Take by mouth.     fenofibrate 54 MG tablet TAKE ONE TABLET BY MOUTH EVERY DAY FOR 30 DAYS     gabapentin (NEURONTIN) 800 MG tablet Take 800 mg by mouth 4 (four) times daily.     gentamicin (GARAMYCIN) 0.3 % ophthalmic solution Apply to eye.     HYDROcodone-acetaminophen (NORCO) 10-325 MG tablet Take by mouth.     ibuprofen (ADVIL) 800 MG tablet Take by mouth.     ketorolac (TORADOL) 10 MG tablet Take 10 mg by mouth every 6 (six) hours as needed.     lidocaine (XYLOCAINE) 2 % solution SMARTSIG:15 Milliliter(s) By Mouth Every 3 Hours PRN     magic mouthwash SOLN Take 15 mLs by mouth in the morning, at noon, in the evening, and at bedtime.     nitroGLYCERIN (NITROSTAT) 0.4 MG SL tablet PLACE 1 TABLET UNDER THE TONGUE EVERY 5 MIN FOR CHEST PAIN AS NEEDED. NO MORE THAN 3 TABS IN 15 MIN.     nystatin (MYCOSTATIN) 100000 UNIT/ML suspension Swish and swallow 4 (four) times daily     ondansetron (ZOFRAN) 8 MG tablet Take 1 tablet (8 mg total) by mouth every 8 (eight) hours as needed for nausea or vomiting. Start on the third day after chemotherapy. 30 tablet 1   oxyCODONE-acetaminophen (PERCOCET/ROXICET) 5-325 MG tablet Take 1-2 tablets by  mouth every 4 (four) hours as needed for severe pain. (Patient not taking: Reported on 01/18/2022) 15 tablet 0   pantoprazole (PROTONIX) 40 MG tablet Take 1 tablet by mouth daily.     phentermine 37.5 MG capsule Take by mouth. (Patient not taking: Reported on 01/12/2022)     prednisoLONE acetate (PRED FORTE) 1 % ophthalmic suspension STARTING AFTER surgery, instill ONE drop by opthalmic route FOUR TIMES DAILY FOR ONE WEEK THEN THREE TIMES DAILY FOR ONE WEEK THEN TWICE DAILY FOR ONE WEEK THEN daily FOR ONE WEEK in THE right IN THE AFFECTED EYE     predniSONE (DELTASONE) 20 MG tablet Take 20 mg by mouth 2 (two) times daily. (Patient not taking: Reported on 01/12/2022)     prochlorperazine (COMPAZINE) 10 MG tablet Take 1 tablet (10 mg total) by mouth every 6 (six) hours as needed for nausea or vomiting. 30 tablet 1   senna-docusate (SENOKOT-S) 8.6-50 MG tablet Take by mouth.     tiZANidine (ZANAFLEX) 4 MG tablet Take by mouth.     triamterene-hydrochlorothiazide (MAXZIDE-25) 37.5-25 MG tablet Take 1 tablet by mouth daily.     varenicline (CHANTIX) 0.5 MG tablet Take 0.5 mg by mouth 2 (two) times daily. (Patient not taking: Reported on 01/12/2022)     No current facility-administered medications for this visit.

## 2022-01-18 NOTE — Telephone Encounter (Signed)
CT Neck and Maxillofacial are scheduled for 01/19/22; Checking in @ 3:30. Pt needs to be fasting 2 hours prior (No solid foods).  Unable to reach via phone to notify.

## 2022-01-18 NOTE — Assessment & Plan Note (Addendum)
Stage IVB oropharyngeal carcinoma, his left tongue and left tonsil, p16 negative.  He is status post radical surgical resection with left partial glossectomy, partial pharyngectomy, radical tonsillectomy and neck dissection..  He had several concerning features on his pathology, including 1 positive node with extranodal extension and focal lymphovascular invasion.  We will plan for concurrent chemoradiation with weekly carboplatin/paclitaxel.  He had his first cycle of chemotherapy on October 4th, but did not get his radiation started.    Chemotherapy was held last week when he had worsening pain and swelling of the left face, which was felt to be parotiditis.  He was place on Augmentin 875 twice daily without improvement on Monday.  The antibiotic was switched to cleocin on Friday.  The patient states he has not had improvement with the cleocin either.  I will obtain a CT face and neck for further evaluation.  We will also refer him to local ENT, as he does not wish to return to his surgeon at [facility].  If this is not due to infection, this may be due to recurrent disease or possibly post surgical changes.  He is scheduled to see Dr. Orlene Erm in radiation tomorrow and resume chemotherapy on Wednesday.  Hopefully we can get imaging done tomorrow.  Regardless, I will see him back in 1 week for repeat clinical assessment.

## 2022-01-18 NOTE — Telephone Encounter (Signed)
Referral faxed to Dr Gaylyn Cheers , requested first available appt.

## 2022-01-18 NOTE — Telephone Encounter (Signed)
-----   Message from [name], PA-C sent at 01/18/2022  2:24 PM EDT ----- Please refer to Dr. Gaylyn Cheers, persistent pain/swelling left face despite antibiotics for presumed parotiditis. S/P left partial glossectomy, partial pharyngectomy, radial tonsillectomy and neck dissection at [facility]. Patient does not wish to return to his surgeon. CT face/neck being scheduled. Thank you

## 2022-01-20 ENCOUNTER — Ambulatory Visit: Payer: Managed Care, Other (non HMO)

## 2022-01-21 ENCOUNTER — Telehealth: Payer: Self-pay

## 2022-01-21 NOTE — Telephone Encounter (Signed)
Pt has appt's to start chemo and radiation on 01/27/2022. Kenard Gower, radiation therapist has spoken to pt and confirmed appt's.

## 2022-01-22 ENCOUNTER — Ambulatory Visit: Payer: Self-pay

## 2022-01-25 ENCOUNTER — Other Ambulatory Visit: Payer: Self-pay | Admitting: Pharmacist

## 2022-01-25 DIAGNOSIS — C109 Malignant neoplasm of oropharynx, unspecified: Secondary | ICD-10-CM

## 2022-01-26 ENCOUNTER — Inpatient Hospital Stay: Payer: Medicaid Other

## 2022-01-26 ENCOUNTER — Encounter: Payer: Self-pay | Admitting: Hematology and Oncology

## 2022-01-26 ENCOUNTER — Inpatient Hospital Stay (INDEPENDENT_AMBULATORY_CARE_PROVIDER_SITE_OTHER): Payer: Medicaid Other | Admitting: Hematology and Oncology

## 2022-01-26 ENCOUNTER — Other Ambulatory Visit: Payer: Self-pay

## 2022-01-26 DIAGNOSIS — C109 Malignant neoplasm of oropharynx, unspecified: Secondary | ICD-10-CM

## 2022-01-26 LAB — BASIC METABOLIC PANEL
BUN: 14 (ref 4–21)
CO2: 29 — AB (ref 13–22)
Chloride: 100 (ref 99–108)
Creatinine: 0.7 (ref 0.6–1.3)
Glucose: 124
Potassium: 3.6 mEq/L (ref 3.5–5.1)
Sodium: 136 — AB (ref 137–147)

## 2022-01-26 LAB — COMPREHENSIVE METABOLIC PANEL
Albumin: 4.5 (ref 3.5–5.0)
Calcium: 9.9 (ref 8.7–10.7)

## 2022-01-26 LAB — HEPATIC FUNCTION PANEL
ALT: 44 U/L — AB (ref 10–40)
AST: 36 (ref 14–40)
Alkaline Phosphatase: 73 (ref 25–125)
Bilirubin, Total: 0.7

## 2022-01-26 LAB — CBC AND DIFFERENTIAL
HCT: 41 (ref 41–53)
Hemoglobin: 14.1 (ref 13.5–17.5)
MCV: 95 — AB (ref 80–94)
Neutrophils Absolute: 2.3
Platelets: 182 10*3/uL (ref 150–400)
WBC: 4.6

## 2022-01-26 LAB — CBC: RBC: 4.26 (ref 3.87–5.11)

## 2022-01-26 NOTE — Progress Notes (Signed)
Ian Case  [address] [place],  [place]  [id number] [phone]  Clinic Day:  01/26/2022  Referring physician: [name], MD  ASSESSMENT & PLAN:   Assessment & Plan: Oropharyngeal carcinoma ([name]) Stage IVB oropharyngeal carcinoma, his left tongue and left tonsil, p16 negative.  Ian Case is status post radical surgical resection with left partial glossectomy, partial pharyngectomy, radical tonsillectomy and neck dissection..  Ian Case had several concerning features on his pathology, including 1 positive node with extranodal extension and focal lymphovascular invasion.  We will plan for concurrent chemoradiation with weekly carboplatin/paclitaxel.  Ian Case had his first cycle of chemotherapy on October 4th, but did not get his radiation started.    Treatment has since been on hold, as we felt Ian Case had infection.  This did not improve with Augmentin or Cleocin, so a CT face and neck was obtained there was a 26 x 11 x 11 mm soft tissue mass posterior left submandibular gland anterior to the sternocleidomastoid.  Some residual soft tissue was noted on PET scan.  This may be postoperative scar tissue versus residual or recurrent tumor versus infection.  We had him to see Dr. Beverlee Case last week who recommended starting concurrent chemoradiation as soon as possible.  Ian Case is scheduled for both Thursday, as Ian Case cannot come for an appointment tomorrow.  I will plan to see him back in 1 week for repeat clinical assessment prior to his next carboplatin/paclitaxel.   The patient understands the plans discussed today and is in agreement with them.  Ian Case knows to contact our office if Ian Case develops concerns prior to his next appointment.     Ian Pickles, PA-C  [facility] AT [facility] [address] [place] [address] Dept: [phone] Dept Fax: [phone]   Orders Placed This Encounter  Procedures   CBC and differential     This external order was created through the Results Console.   CBC    This external order was created through the Results Console.   Basic metabolic panel    This external order was created through the Results Console.   Comprehensive metabolic panel    This external order was created through the Results Console.   Hepatic function panel    This external order was created through the Results Console.      CHIEF COMPLAINT:  CC: Oropharyngeal cancer  Current Treatment: Concurrent chemoradiation with weekly carboplatin/paclitaxel  HISTORY OF PRESENT ILLNESS:   Oncology History  Oropharyngeal carcinoma (Ian Case)  11/06/2021 Initial Diagnosis   Oropharyngeal carcinoma ([name])   11/06/2021 Cancer Staging   Staging form: Pharynx - P16 Negative Oropharynx, AJCC 8th Edition - Clinical stage from 11/06/2021: Stage IVB (cT1, cN3, cM0, p16-) - Signed by Ian Kaplan, MD on 12/28/2021 Histopathologic type: Squamous cell carcinoma, NOS Stage prefix: Initial diagnosis Histologic grade (G): G2 Histologic grading system: 4 grade system Tumor size (mm): 13 Lymph-vascular invasion (LVI): LVI present/identified, NOS Diagnostic confirmation: Positive histology Specimen type: Excision Staged by: Managing physician Presence of extranodal extension: Present Extent of extranodal extension: Microscopic ENE(+) Distance of extension from the native lymph node capsule to the farthest point of invasion in the extranodal tissue (mm): 6.5 ECOG performance status: Grade 1 Perineural invasion (PNI): Absent Prognostic indicators: 1.3 cm with 1/23 nodes positive -2.3 cm with ENE and focal LVI Stage used in treatment planning: Yes National guidelines used in treatment planning: Yes Type of national guideline used in treatment planning:  NCCN   01/06/2022 -  Chemotherapy   Patient is on Treatment Plan : HEAD/NECK Carboplatin + Paclitaxel + XRT q7d     01/19/2022 Imaging   CT neck:  IMPRESSION:  1. 26 x  11 x 11 mm soft tissue mass posterior to the left  submandibular gland and anterior to the SCM. Some residual soft  tissue is noted in the PET scan. While this may represent  postoperative scar tissue, it is concerning for residual or  recurrent tumor.  2. Asymmetric soft tissue at the left glossal tonsillar sulcus with  focal surgical clips. No definite mass lesion is present.  3. Left carotid stent is in place. Vessel appears patent.  4. Atherosclerotic calcifications at the right carotid bifurcation  with what appears to be a high-grade proximal right ICA stenosis.  5. Right IJ Port-A-Cath in place.        INTERVAL HISTORY:  Ian Case is here today for repeat clinical assessment prior to resuming weekly carboplatin/paclitaxel.  Ian Case reports persistent left facial swelling and pain, as well as left neck pain.  The left face/jaw pain is worse with eating.  Ian Case has chronic back pain and is scheduled for an injection next week.  Ian Case saw Dr. Beverlee Case last week who felt we needed to start his treatment as soon as possible due to the possibility of recurrent/residual tumor.  Ian Case also gave him further antibiotics in case of infection.  The patient was scheduled to start radiation tomorrow, but Ian Case is unable to come for that appointment.  Ian Case will start his radiation on Thursday. Ian Case denies fevers or chills.  His appetite is decreased, mainly because Ian Case has pain with eating. His weight has decreased 6 pounds over last week.   Ian Case is still not using his feeding tube for nutrition.  Ian Case is drinking protein shakes.  REVIEW OF SYSTEMS:  Review of Systems  Constitutional:  Positive for appetite change and unexpected weight change. Negative for chills, fatigue and fever.  HENT:   Negative for lump/mass, mouth sores and sore throat.   Respiratory:  Negative for cough and shortness of breath.   Cardiovascular:  Negative for chest pain and leg swelling.  Gastrointestinal:  Negative for abdominal pain, constipation,  diarrhea, nausea and vomiting.  Genitourinary:  Negative for difficulty urinating, dysuria, frequency and hematuria.   Musculoskeletal:  Positive for back pain and neck pain. Negative for arthralgias and myalgias.  Skin:  Negative for itching, rash and wound.  Neurological:  Negative for dizziness, extremity weakness, headaches, light-headedness and numbness.  Hematological:  Negative for adenopathy.  Psychiatric/Behavioral:  Negative for depression and sleep disturbance. The patient is not nervous/anxious.      VITALS:  Blood pressure (!) 159/80, pulse 74, temperature 99 F (37.2 C), temperature source Oral, resp. rate 18, height '5\' 9"'$  (1.753 m), weight 237 lb 4.8 oz (107.6 kg), SpO2 99 %.  Wt Readings from Last 3 Encounters:  01/26/22 237 lb 4.8 oz (107.6 kg)  01/18/22 243 lb 3.2 oz (110.3 kg)  01/12/22 235 lb 12.8 oz (107 kg)    Body mass index is 35.04 kg/m.  Performance status (ECOG): 1 - Symptomatic but completely ambulatory  PHYSICAL EXAM:  Physical Exam Vitals and nursing note reviewed.  Constitutional:      General: Ian Case is not in acute distress.    Appearance: Normal appearance. Ian Case is normal weight.  HENT:     Head: Normocephalic and atraumatic.     Jaw: Tenderness (Left jaw and face) and  swelling (Left face/parotid) present.     Mouth/Throat:     Mouth: Mucous membranes are moist. No oral lesions.     Tongue: No lesions.     Pharynx: Oropharynx is clear. No oropharyngeal exudate or posterior oropharyngeal erythema.     Comments: No palpable or visual oral lesions. Eyes:     General: No scleral icterus.    Extraocular Movements: Extraocular movements intact.     Conjunctiva/sclera: Conjunctivae normal.     Pupils: Pupils are equal, round, and reactive to light.  Cardiovascular:     Rate and Rhythm: Normal rate and regular rhythm.     Heart sounds: Normal heart sounds. No murmur heard.    No friction rub. No gallop.  Pulmonary:     Effort: Pulmonary effort is  normal.     Breath sounds: Normal breath sounds. No wheezing, rhonchi or rales.  Musculoskeletal:        General: Normal range of motion.     Cervical back: Normal range of motion and neck supple. No tenderness.     Right lower leg: No edema.     Left lower leg: No edema.  Lymphadenopathy:     Cervical: No cervical adenopathy.  Skin:    General: Skin is warm and dry.     Coloration: Skin is not jaundiced.     Findings: No rash.  Neurological:     Mental Status: Ian Case is alert and oriented to person, place, and time.     Cranial Nerves: No cranial nerve deficit.  Psychiatric:        Mood and Affect: Mood normal.        Behavior: Behavior normal.        Thought Content: Thought content normal.    LABS:      Latest Ref Rng & Units 01/26/2022   12:00 AM 01/18/2022   12:00 AM 01/12/2022   12:00 AM  CBC  WBC  4.6     4.2     4.8      Hemoglobin 13.5 - 17.5 14.1     13.1     13.0      Hematocrit 41 - 53 41     38     37      Platelets 150 - 400 K/uL 182     232     230         This result is from an external source.      Latest Ref Rng & Units 01/26/2022   12:00 AM 01/18/2022   12:00 AM 01/12/2022   12:00 AM  CMP  BUN 4 - '21 14     22     20      '$ Creatinine 0.6 - 1.3 0.7     0.7     0.6      Sodium 137 - 147 136     136     135      Potassium 3.5 - 5.1 mEq/L 3.6     4.0     3.9      Chloride 99 - 108 100     104     103      CO2 13 - '22 29     28     25      '$ Calcium 8.7 - 10.7 9.9     9.4     9.7      Alkaline Phos 25 - 125 73     104  72      AST 14 - 40 36     58     26      ALT 10 - 40 U/L 44     74     28         This result is from an external source.     No results found for: "CEA1", "CEA" / No results found for: "CEA1", "CEA" No results found for: "PSA1" No results found for: "VXB939" No results found for: "CAN125"  No results found for: "TOTALPROTELP", "ALBUMINELP", "A1GS", "A2GS", "BETS", "BETA2SER", "GAMS", "MSPIKE", "SPEI" No results found for:  "TIBC", "FERRITIN", "IRONPCTSAT" No results found for: "LDH"  STUDIES:  No results found.    HISTORY:   Past Medical History:  Diagnosis Date   Arthritis    Back pain    GERD (gastroesophageal reflux disease)    History of kidney stones    Hypertension    Sleep apnea    does not have cpap yet    Past Surgical History:  Procedure Laterality Date   APPENDECTOMY     HAND SURGERY     LUMBAR LAMINECTOMY/DECOMPRESSION MICRODISCECTOMY N/A 12/28/2017   Procedure: Laminectomy and Foraminotomy Lumbar two-three-Lumbar three-four - Lumbar four-five;  Surgeon: Kary Kos, MD;  Location: [facility];  Service: Neurosurgery;  Laterality: N/A;   OTHER SURGICAL HISTORY     removal on cancer from neck    Family History  Problem Relation Age of Onset   Thyroid disease Mother    Heart disease Father    Prostate cancer Brother    Healthy Daughter    Healthy Son     Social History:  reports that Ian Case has been smoking cigarettes. Ian Case has a 22.00 pack-year smoking history. Ian Case has never used smokeless tobacco. Ian Case reports that Ian Case does not currently use alcohol. Ian Case reports that Ian Case does not use drugs.The patient is accompanied by his wife today.  Allergies: No Known Allergies  Current Medications: Current Outpatient Medications  Medication Sig Dispense Refill   amitriptyline (ELAVIL) 75 MG tablet Take 1 tablet by mouth daily.     amLODipine (NORVASC) 10 MG tablet Take 1 tablet by mouth every morning.     aspirin EC 81 MG tablet Take by mouth.     atorvastatin (LIPITOR) 80 MG tablet Take 80 mg by mouth daily.     chlorhexidine (PERIDEX) 0.12 % solution SMARTSIG:1 Capful(s) By Mouth Twice Daily     clindamycin (CLEOCIN) 300 MG capsule Take 1 capsule (300 mg total) by mouth 4 (four) times daily for 14 days. 56 capsule 0   dexamethasone 0.5 MG/5ML elixir Take by mouth.     fenofibrate 54 MG tablet TAKE ONE TABLET BY MOUTH EVERY DAY FOR 30 DAYS     gabapentin (NEURONTIN) 800 MG tablet Take 800 mg by  mouth 4 (four) times daily.     gentamicin (GARAMYCIN) 0.3 % ophthalmic solution Apply to eye.     HYDROcodone-acetaminophen (NORCO) 10-325 MG tablet Take by mouth.     ibuprofen (ADVIL) 800 MG tablet Take by mouth.     ketorolac (TORADOL) 10 MG tablet Take 10 mg by mouth every 6 (six) hours as needed.     lidocaine (XYLOCAINE) 2 % solution SMARTSIG:15 Milliliter(s) By Mouth Every 3 Hours PRN     magic mouthwash SOLN Take 15 mLs by mouth in the morning, at noon, in the evening, and at bedtime.     nitroGLYCERIN (NITROSTAT) 0.4 MG SL tablet PLACE 1  TABLET UNDER THE TONGUE EVERY 5 MIN FOR CHEST PAIN AS NEEDED. NO MORE THAN 3 TABS IN 15 MIN.     nystatin (MYCOSTATIN) 100000 UNIT/ML suspension Swish and swallow 4 (four) times daily     ondansetron (ZOFRAN) 8 MG tablet Take 1 tablet (8 mg total) by mouth every 8 (eight) hours as needed for nausea or vomiting. Start on the third day after chemotherapy. 30 tablet 1   oxyCODONE-acetaminophen (PERCOCET/ROXICET) 5-325 MG tablet Take 1-2 tablets by mouth every 4 (four) hours as needed for severe pain. (Patient not taking: Reported on 01/18/2022) 15 tablet 0   pantoprazole (PROTONIX) 40 MG tablet Take 1 tablet by mouth daily.     phentermine 37.5 MG capsule Take by mouth. (Patient not taking: Reported on 01/12/2022)     prednisoLONE acetate (PRED FORTE) 1 % ophthalmic suspension STARTING AFTER surgery, instill ONE drop by opthalmic route FOUR TIMES DAILY FOR ONE WEEK THEN THREE TIMES DAILY FOR ONE WEEK THEN TWICE DAILY FOR ONE WEEK THEN daily FOR ONE WEEK in THE right IN THE AFFECTED EYE     predniSONE (DELTASONE) 20 MG tablet Take 20 mg by mouth 2 (two) times daily. (Patient not taking: Reported on 01/12/2022)     prochlorperazine (COMPAZINE) 10 MG tablet Take 1 tablet (10 mg total) by mouth every 6 (six) hours as needed for nausea or vomiting. 30 tablet 1   senna-docusate (SENOKOT-S) 8.6-50 MG tablet Take by mouth.     tiZANidine (ZANAFLEX) 4 MG tablet Take  by mouth.     triamterene-hydrochlorothiazide (MAXZIDE-25) 37.5-25 MG tablet Take 1 tablet by mouth daily.     varenicline (CHANTIX) 0.5 MG tablet Take 0.5 mg by mouth 2 (two) times daily. (Patient not taking: Reported on 01/12/2022)     No current facility-administered medications for this visit.

## 2022-01-26 NOTE — Assessment & Plan Note (Signed)
Stage IVB oropharyngeal carcinoma, his left tongue and left tonsil, p16 negative.  He is status post radical surgical resection with left partial glossectomy, partial pharyngectomy, radical tonsillectomy and neck dissection..  He had several concerning features on his pathology, including 1 positive node with extranodal extension and focal lymphovascular invasion.  We will plan for concurrent chemoradiation with weekly carboplatin/paclitaxel.  He had his first cycle of chemotherapy on October 4th, but did not get his radiation started.    Treatment has since been on hold, as we felt he had infection.  This did not improve with Augmentin or Cleocin, so a CT face and neck was obtained there was a 26 x 11 x 11 mm soft tissue mass posterior left submandibular gland anterior to the sternocleidomastoid.  Some residual soft tissue was noted on PET scan.  This may be postoperative scar tissue versus residual or recurrent tumor versus infection.  We had him to see Dr. Beverlee Nims last week who recommended starting concurrent chemoradiation as soon as possible.  He is scheduled for both Thursday, as he cannot come for an appointment tomorrow.  I will plan to see him back in 1 week for repeat clinical assessment prior to his next carboplatin/paclitaxel.

## 2022-01-27 ENCOUNTER — Other Ambulatory Visit: Payer: Self-pay

## 2022-01-27 ENCOUNTER — Ambulatory Visit: Payer: Managed Care, Other (non HMO)

## 2022-01-27 MED FILL — Carboplatin IV Soln 450 MG/45ML: INTRAVENOUS | Qty: 30 | Status: AC

## 2022-01-27 MED FILL — Dexamethasone Sodium Phosphate Inj 100 MG/10ML: INTRAMUSCULAR | Qty: 1 | Status: AC

## 2022-01-27 MED FILL — Paclitaxel IV Conc 300 MG/50ML (6 MG/ML): INTRAVENOUS | Qty: 17 | Status: AC

## 2022-01-28 ENCOUNTER — Inpatient Hospital Stay: Payer: Medicaid Other

## 2022-01-28 VITALS — BP 124/72 | HR 68 | Temp 98.3°F | Resp 18 | Ht 69.0 in | Wt 243.0 lb

## 2022-01-28 DIAGNOSIS — Z5111 Encounter for antineoplastic chemotherapy: Secondary | ICD-10-CM | POA: Diagnosis not present

## 2022-01-28 DIAGNOSIS — C109 Malignant neoplasm of oropharynx, unspecified: Secondary | ICD-10-CM

## 2022-01-28 MED ORDER — SODIUM CHLORIDE 0.9% FLUSH
10.0000 mL | INTRAVENOUS | Status: DC | PRN
  Administered 2022-01-28: 10 mL

## 2022-01-28 MED ORDER — SODIUM CHLORIDE 0.9 % IV SOLN
300.0000 mg | Freq: Once | INTRAVENOUS | Status: AC
  Administered 2022-01-28: 300 mg via INTRAVENOUS
  Filled 2022-01-28: qty 30

## 2022-01-28 MED ORDER — DIPHENHYDRAMINE HCL 50 MG/ML IJ SOLN
50.0000 mg | Freq: Once | INTRAMUSCULAR | Status: AC
  Administered 2022-01-28: 50 mg via INTRAVENOUS
  Filled 2022-01-28: qty 1

## 2022-01-28 MED ORDER — PALONOSETRON HCL INJECTION 0.25 MG/5ML
0.2500 mg | Freq: Once | INTRAVENOUS | Status: AC
  Administered 2022-01-28: 0.25 mg via INTRAVENOUS
  Filled 2022-01-28: qty 5

## 2022-01-28 MED ORDER — FAMOTIDINE IN NACL 20-0.9 MG/50ML-% IV SOLN
20.0000 mg | Freq: Once | INTRAVENOUS | Status: AC
  Administered 2022-01-28: 20 mg via INTRAVENOUS
  Filled 2022-01-28: qty 50

## 2022-01-28 MED ORDER — SODIUM CHLORIDE 0.9 % IV SOLN: Freq: Once | INTRAVENOUS | Status: AC

## 2022-01-28 MED ORDER — SODIUM CHLORIDE 0.9 % IV SOLN
45.0000 mg/m2 | Freq: Once | INTRAVENOUS | Status: AC
  Administered 2022-01-28: 102 mg via INTRAVENOUS
  Filled 2022-01-28: qty 17

## 2022-01-28 MED ORDER — HEPARIN SOD (PORK) LOCK FLUSH 100 UNIT/ML IV SOLN
500.0000 [IU] | Freq: Once | INTRAVENOUS | Status: AC | PRN
  Administered 2022-01-28: 500 [IU]

## 2022-01-28 MED ORDER — SODIUM CHLORIDE 0.9 % IV SOLN
10.0000 mg | Freq: Once | INTRAVENOUS | Status: AC
  Administered 2022-01-28: 10 mg via INTRAVENOUS
  Filled 2022-01-28: qty 10
  Filled 2022-01-28: qty 1

## 2022-01-28 NOTE — Patient Instructions (Signed)
Paclitaxel Injection What is this medication? PACLITAXEL (PAK li TAX el) treats some types of cancer. It works by slowing down the growth of cancer cells. This medicine may be used for other purposes; ask your health care provider or pharmacist if you have questions. COMMON BRAND NAME(S): Onxol, Taxol What should I tell my care team before I take this medication? They need to know if you have any of these conditions: Heart disease Liver disease Low white blood cell levels An unusual or allergic reaction to paclitaxel, other medications, foods, dyes, or preservatives If you or your partner are pregnant or trying to get pregnant Breast-feeding How should I use this medication? This medication is injected into a vein. It is given by your care team in a hospital or clinic setting. Talk to your care team about the use of this medication in children. While it may be given to children for selected conditions, precautions do apply. Overdosage: If you think you have taken too much of this medicine contact a poison control center or emergency room at once. NOTE: This medicine is only for you. Do not share this medicine with others. What if I miss a dose? Keep appointments for follow-up doses. It is important not to miss your dose. Call your care team if you are unable to keep an appointment. What may interact with this medication? Do not take this medication with any of the following: Live virus vaccines Other medications may affect the way this medication works. Talk with your care team about all of the medications you take. They may suggest changes to your treatment plan to lower the risk of side effects and to make sure your medications work as intended. This list may not describe all possible interactions. Give your health care provider a list of all the medicines, herbs, non-prescription drugs, or dietary supplements you use. Also tell them if you smoke, drink alcohol, or use illegal drugs. Some  items may interact with your medicine. What should I watch for while using this medication? Your condition will be monitored carefully while you are receiving this medication. You may need blood work while taking this medication. This medication may make you feel generally unwell. This is not uncommon as chemotherapy can affect healthy cells as well as cancer cells. Report any side effects. Continue your course of treatment even though you feel ill unless your care team tells you to stop. This medication can cause serious allergic reactions. To reduce the risk, your care team may give you other medications to take before receiving this one. Be sure to follow the directions from your care team. This medication may increase your risk of getting an infection. Call your care team for advice if you get a fever, chills, sore throat, or other symptoms of a cold or flu. Do not treat yourself. Try to avoid being around people who are sick. This medication may increase your risk to bruise or bleed. Call your care team if you notice any unusual bleeding. Be careful brushing or flossing your teeth or using a toothpick because you may get an infection or bleed more easily. If you have any dental work done, tell your dentist you are receiving this medication. Talk to your care team if you may be pregnant. Serious birth defects can occur if you take this medication during pregnancy. Talk to your care team before breastfeeding. Changes to your treatment plan may be needed. What side effects may I notice from receiving this medication? Side effects that you   should report to your care team as soon as possible: Allergic reactions--skin rash, itching, hives, swelling of the face, lips, tongue, or throat Heart rhythm changes--fast or irregular heartbeat, dizziness, feeling faint or lightheaded, chest pain, trouble breathing Increase in blood pressure Infection--fever, chills, cough, sore throat, wounds that don't heal,  pain or trouble when passing urine, general feeling of discomfort or being unwell Low blood pressure--dizziness, feeling faint or lightheaded, blurry vision Low red blood cell level--unusual weakness or fatigue, dizziness, headache, trouble breathing Painful swelling, warmth, or redness of the skin, blisters or sores at the infusion site Pain, tingling, or numbness in the hands or feet Slow heartbeat--dizziness, feeling faint or lightheaded, confusion, trouble breathing, unusual weakness or fatigue Unusual bruising or bleeding Side effects that usually do not require medical attention (report to your care team if they continue or are bothersome): Diarrhea Hair loss Joint pain Loss of appetite Muscle pain Nausea Vomiting This list may not describe all possible side effects. Call your doctor for medical advice about side effects. You may report side effects to FDA at 1-800-FDA-1088. Where should I keep my medication? This medication is given in a hospital or clinic. It will not be stored at home. NOTE: This sheet is a summary. It may not cover all possible information. If you have questions about this medicine, talk to your doctor, pharmacist, or health care provider.  2023 [employer] Standard (2021-07-22 00:00:00) Carboplatin Injection What is this medication? CARBOPLATIN (KAR boe pla tin) treats some types of cancer. It works by slowing down the growth of cancer cells. This medicine may be used for other purposes; ask your health care provider or pharmacist if you have questions. COMMON BRAND NAME(S): Paraplatin What should I tell my care team before I take this medication? They need to know if you have any of these conditions: Blood disorders Hearing problems Kidney disease Recent or ongoing radiation therapy An unusual or allergic reaction to carboplatin, cisplatin, other medications, foods, dyes, or preservatives Pregnant or trying to get pregnant Breast-feeding How should I  use this medication? This medication is injected into a vein. It is given by your care team in a hospital or clinic setting. Talk to your care team about the use of this medication in children. Special care may be needed. Overdosage: If you think you have taken too much of this medicine contact a poison control center or emergency room at once. NOTE: This medicine is only for you. Do not share this medicine with others. What if I miss a dose? Keep appointments for follow-up doses. It is important not to miss your dose. Call your care team if you are unable to keep an appointment. What may interact with this medication? Medications for seizures Some antibiotics, such as amikacin, gentamicin, neomycin, streptomycin, tobramycin Vaccines This list may not describe all possible interactions. Give your health care provider a list of all the medicines, herbs, non-prescription drugs, or dietary supplements you use. Also tell them if you smoke, drink alcohol, or use illegal drugs. Some items may interact with your medicine. What should I watch for while using this medication? Your condition will be monitored carefully while you are receiving this medication. You may need blood work while taking this medication. This medication may make you feel generally unwell. This is not uncommon, as chemotherapy can affect healthy cells as well as cancer cells. Report any side effects. Continue your course of treatment even though you feel ill unless your care team tells you to stop. In   some cases, you may be given additional medications to help with side effects. Follow all directions for their use. This medication may increase your risk of getting an infection. Call your care team for advice if you get a fever, chills, sore throat, or other symptoms of a cold or flu. Do not treat yourself. Try to avoid being around people who are sick. Avoid taking medications that contain aspirin, acetaminophen, ibuprofen, naproxen,  or ketoprofen unless instructed by your care team. These medications may hide a fever. Be careful brushing or flossing your teeth or using a toothpick because you may get an infection or bleed more easily. If you have any dental work done, tell your dentist you are receiving this medication. Talk to your care team if you wish to become pregnant or think you might be pregnant. This medication can cause serious birth defects. Talk to your care team about effective forms of contraception. Do not breast-feed while taking this medication. What side effects may I notice from receiving this medication? Side effects that you should report to your care team as soon as possible: Allergic reactions--skin rash, itching, hives, swelling of the face, lips, tongue, or throat Infection--fever, chills, cough, sore throat, wounds that don't heal, pain or trouble when passing urine, general feeling of discomfort or being unwell Low red blood cell level--unusual weakness or fatigue, dizziness, headache, trouble breathing Pain, tingling, or numbness in the hands or feet, muscle weakness, change in vision, confusion or trouble speaking, loss of balance or coordination, trouble walking, seizures Unusual bruising or bleeding Side effects that usually do not require medical attention (report to your care team if they continue or are bothersome): Hair loss Nausea Unusual weakness or fatigue Vomiting This list may not describe all possible side effects. Call your doctor for medical advice about side effects. You may report side effects to FDA at 1-800-FDA-1088. Where should I keep my medication? This medication is given in a hospital or clinic. It will not be stored at home. NOTE: This sheet is a summary. It may not cover all possible information. If you have questions about this medicine, talk to your doctor, pharmacist, or health care provider.  2023 [employer]/Gold Standard (2021-07-06 00:00:00)  

## 2022-02-02 ENCOUNTER — Inpatient Hospital Stay: Payer: Medicaid Other

## 2022-02-02 ENCOUNTER — Encounter: Payer: Self-pay | Admitting: Hematology and Oncology

## 2022-02-02 ENCOUNTER — Inpatient Hospital Stay (INDEPENDENT_AMBULATORY_CARE_PROVIDER_SITE_OTHER): Payer: Medicaid Other | Admitting: Hematology and Oncology

## 2022-02-02 ENCOUNTER — Inpatient Hospital Stay: Payer: Medicaid Other | Admitting: Dietician

## 2022-02-02 DIAGNOSIS — C109 Malignant neoplasm of oropharynx, unspecified: Secondary | ICD-10-CM | POA: Diagnosis not present

## 2022-02-02 LAB — CBC AND DIFFERENTIAL
HCT: 39 — AB (ref 41–53)
Hemoglobin: 13.7 (ref 13.5–17.5)
MCV: 95 — AB (ref 80–94)
Neutrophils Absolute: 2.53
Platelets: 136 10*3/uL — AB (ref 150–400)
WBC: 4.6

## 2022-02-02 LAB — BASIC METABOLIC PANEL
BUN: 24 — AB (ref 4–21)
CO2: 28 — AB (ref 13–22)
Chloride: 100 (ref 99–108)
Creatinine: 0.6 (ref 0.6–1.3)
Glucose: 83
Potassium: 3.8 mEq/L (ref 3.5–5.1)
Sodium: 136 — AB (ref 137–147)

## 2022-02-02 LAB — COMPREHENSIVE METABOLIC PANEL
Albumin: 4.2 (ref 3.5–5.0)
Calcium: 9.6 (ref 8.7–10.7)

## 2022-02-02 LAB — CBC: RBC: 4.1 (ref 3.87–5.11)

## 2022-02-02 NOTE — Assessment & Plan Note (Addendum)
Stage IVB oropharyngeal carcinoma, his left tongue and left tonsil, p16 negative.  He is status post radical surgical resection with left partial glossectomy, partial pharyngectomy, radical tonsillectomy and neck dissection..  He had several concerning features on his pathology, including 1 positive node with extranodal extension and focal lymphovascular invasion.  We will plan for concurrent chemoradiation with weekly carboplatin/paclitaxel.  He had his first cycle of chemotherapy on October 4th, but did not get his radiation started.    Treatment has since been on hold, as we felt he had infection.  This did not improve with Augmentin or Cleocin, so a CT face and neck was obtained there was a 26 x 11 x 11 mm soft tissue mass posterior left submandibular gland anterior to the sternocleidomastoid.  Some residual soft tissue was noted on PET scan.  This may be postoperative scar tissue versus residual or recurrent tumor versus infection.  We had him to see Dr. Beverlee Nims last week who recommended starting concurrent chemoradiation as soon as possible.  Dr. Beverlee Nims did continue antibiotic treatment with Clindamycin for 10 more days.  The patient resumed concurrent chemoradiation on October 26th.   He reports persistent pain in the left jaw and neck, but states that the radiation seems to have helped somewhat.  He is mainly doing a liquid diet due to decreased appetite and increased pain with chewing.  He is scheduled to meet with the dietitian today as well.  Overall, he is tolerating treatment well.  He has slightly less swelling, but persistent pain in the right face and neck.  He will proceed with a third cycle of carboplatin/paclitaxel this week and continue daily radiation.  I will plan to see him back in 1 week for repeat clinical assessment prior to his fourth cycle.

## 2022-02-02 NOTE — Progress Notes (Signed)
[facility]  [address] 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  He is status post radical surgical resection with left partial glossectomy, partial pharyngectomy, radical tonsillectomy and neck dissection..  He had several concerning features on his pathology, including 1 positive node with extranodal extension and focal lymphovascular invasion.  We will plan for concurrent chemoradiation with weekly carboplatin/paclitaxel.  He had his first cycle of chemotherapy on October 4th, but did not get his radiation started.    Treatment has since been on hold, as we felt he had infection.  This did not improve with Augmentin or Cleocin, so a CT face and neck was obtained there was a 26 x 11 x 11 mm soft tissue mass posterior left submandibular gland anterior to the sternocleidomastoid.  Some residual soft tissue was noted on PET scan.  This may be postoperative scar tissue versus residual or recurrent tumor versus infection.  We had him to see Dr. Beverlee Nims last week who recommended starting concurrent chemoradiation as soon as possible.  Dr. Beverlee Nims did continue antibiotic treatment with Clindamycin for 10 more days.  The patient resumed concurrent chemoradiation on October 26th.   He reports persistent pain in the left jaw and neck, but states that the radiation seems to have helped somewhat.  He is mainly doing a liquid diet due to decreased appetite and increased pain with chewing.  He is scheduled to meet with the dietitian today as well.  Overall, he is tolerating treatment well.  He has slightly less swelling, but persistent pain in the right face and neck.  He will proceed with a third cycle of carboplatin/paclitaxel this week and continue daily  radiation.  I will plan to see him back in 1 week for repeat clinical assessment prior to his fourth cycle.   The patient understands the plans discussed today and is in agreement with them.  He knows to contact our office if he develops concerns prior to his next appointment.    [name], PA-C  [facility] AT [facility] [address] [place] [address] Dept: [phone] Dept Fax: [phone]   Orders Placed This Encounter  Procedures   CBC and differential    This external order was created through the Results Console.   CBC    This external order was created through the Results Console.   Basic metabolic panel    This external order was created through the Results Console.   Comprehensive metabolic panel    This external order was created through the Results Console.      CHIEF COMPLAINT:  CC: Oropharynx cancer  Current Treatment: Concurrent chemoradiation with weekly carboplatin/paclitaxel  HISTORY OF PRESENT ILLNESS:   Oncology History  Oropharyngeal carcinoma ([place])  11/06/2021 Initial Diagnosis   Oropharyngeal carcinoma ([place])   11/06/2021 Cancer Staging   Staging form: Pharynx - P16 Negative Oropharynx, AJCC 8th Edition - Clinical stage from 11/06/2021: Stage IVB (cT1, cN3, cM0, p16-) - Signed by [name], MD on 12/28/2021 Histopathologic type: Squamous cell carcinoma, NOS Stage prefix: Initial diagnosis Histologic grade (G): G2 Histologic grading system: 4 grade system Tumor size (mm): 13 Lymph-vascular invasion (LVI): LVI present/identified, NOS Diagnostic confirmation: Positive histology Specimen type: Excision Staged by: Managing  physician Presence of extranodal extension: Present Extent of extranodal extension: Microscopic ENE(+) Distance of extension from the native lymph node capsule to the farthest point of invasion in the extranodal tissue (mm): 6.5 ECOG performance status: Grade  1 Perineural invasion (PNI): Absent Prognostic indicators: 1.3 cm with 1/23 nodes positive -2.3 cm with ENE and focal LVI Stage used in treatment planning: Yes National guidelines used in treatment planning: Yes Type of national guideline used in treatment planning: NCCN   01/06/2022 -  Chemotherapy   Patient is on Treatment Plan : HEAD/NECK Carboplatin + Paclitaxel + XRT q7d     01/19/2022 Imaging   CT neck:  IMPRESSION:  1. 26 x 11 x 11 mm soft tissue mass posterior to the left  submandibular gland and anterior to the SCM. Some residual soft  tissue is noted in the PET scan. While this may represent  postoperative scar tissue, it is concerning for residual or  recurrent tumor.  2. Asymmetric soft tissue at the left glossal tonsillar sulcus with  focal surgical clips. No definite mass lesion is present.  3. Left carotid stent is in place. Vessel appears patent.  4. Atherosclerotic calcifications at the right carotid bifurcation  with what appears to be a high-grade proximal right ICA stenosis.  5. Right IJ Port-A-Cath in place.        INTERVAL HISTORY:  Ian Case is here today for repeat clinical assessment prior to a third cycle of carboplatin/paclitaxel.  He feels he may have slightly less pain since starting radiation, although he had severe pain over the weekend.  He tolerated chemotherapy last week without difficulty. He denies fevers or chills. He denies pain. His appetite is reduced and he is sticking to mostly liquid diet due to the increased pain with eating. His weight has decreased 3 pounds over last week .  REVIEW OF SYSTEMS:  Review of Systems  Constitutional:  Positive for appetite change and unexpected weight change. Negative for chills, fatigue and fever.  HENT:   Negative for lump/mass, mouth sores and sore throat.   Respiratory:  Negative for cough and shortness of breath.   Cardiovascular:  Negative for chest pain and leg swelling.  Gastrointestinal:  Negative  for abdominal pain, constipation, diarrhea, nausea and vomiting.  Genitourinary:  Negative for difficulty urinating, dysuria, frequency and hematuria.   Musculoskeletal:  Negative for arthralgias, back pain and myalgias.  Skin:  Negative for itching, rash and wound.  Neurological:  Negative for dizziness, extremity weakness, headaches, light-headedness and numbness.  Hematological:  Negative for adenopathy.  Psychiatric/Behavioral:  Negative for depression and sleep disturbance. The patient is not nervous/anxious.      VITALS:  Blood pressure 125/86, pulse 76, temperature 98.6 F (37 C), temperature source Oral, resp. rate 20, height '5\' 9"'$  (1.753 m), weight 240 lb 9.6 oz (109.1 kg), SpO2 99 %.  Wt Readings from Last 3 Encounters:  02/02/22 240 lb 9.6 oz (109.1 kg)  01/28/22 243 lb (110.2 kg)  01/26/22 237 lb 4.8 oz (107.6 kg)    Body mass index is 35.53 kg/m.  Performance status (ECOG): 1 - Symptomatic but completely ambulatory  PHYSICAL EXAM:  Physical Exam Vitals and nursing note reviewed.  Constitutional:      General: He is not in acute distress.    Appearance: Normal appearance. He is normal weight.  HENT:     Head: Normocephalic and atraumatic.     Jaw: Tenderness (Persistent tenderness of the left face, jaw, postauricular area and neck), swelling (Slightly  decreased swelling left face and jaw) and pain on movement (Persistent pain in the left jaw and neck with movement.) present.     Mouth/Throat:     Mouth: Mucous membranes are moist. No oral lesions.     Tongue: No lesions.     Pharynx: Oropharynx is clear. No oropharyngeal exudate or posterior oropharyngeal erythema.  Eyes:     General: No scleral icterus.    Extraocular Movements: Extraocular movements intact.     Conjunctiva/sclera: Conjunctivae normal.     Pupils: Pupils are equal, round, and reactive to light.  Cardiovascular:     Rate and Rhythm: Normal rate and regular rhythm.     Heart sounds: Normal heart  sounds. No murmur heard.    No friction rub. No gallop.  Pulmonary:     Effort: Pulmonary effort is normal.     Breath sounds: Normal breath sounds. No wheezing, rhonchi or rales.  Abdominal:     General: Bowel sounds are normal. There is no distension.     Palpations: Abdomen is soft. There is no hepatomegaly, splenomegaly or mass.     Tenderness: There is no abdominal tenderness.  Musculoskeletal:        General: Normal range of motion.     Cervical back: Normal range of motion and neck supple. No tenderness. Pain with movement present.     Right lower leg: No edema.     Left lower leg: No edema.  Lymphadenopathy:     Cervical: No cervical adenopathy.     Upper Body:     Right upper body: No supraclavicular or axillary adenopathy.     Left upper body: No supraclavicular or axillary adenopathy.     Lower Body: No right inguinal adenopathy. No left inguinal adenopathy.  Skin:    General: Skin is warm and dry.     Coloration: Skin is not jaundiced.     Findings: No rash.  Neurological:     Mental Status: He is alert and oriented to person, place, and time.     Cranial Nerves: No cranial nerve deficit.  Psychiatric:        Mood and Affect: Mood normal.        Behavior: Behavior normal.        Thought Content: Thought content normal.     LABS:      Latest Ref Rng & Units 02/02/2022   12:00 AM 01/26/2022   12:00 AM 01/18/2022   12:00 AM  CBC  WBC  4.6     4.6     4.2      Hemoglobin 13.5 - 17.5 13.7     14.1     13.1      Hematocrit 41 - 53 39     41     38      Platelets 150 - 400 K/uL 136     182     232         This result is from an external source.      Latest Ref Rng & Units 02/02/2022   12:00 AM 01/26/2022   12:00 AM 01/18/2022   12:00 AM  CMP  BUN 4 - '21 24     14     22      '$ Creatinine 0.6 - 1.3 0.6     0.7     0.7      Sodium 137 - 147 136     136     136  Potassium 3.5 - 5.1 mEq/L 3.8     3.6     4.0      Chloride 99 - 108 100     100     104       CO2 13 - '22 28     29     28      '$ Calcium 8.7 - 10.7 9.6     9.9     9.4      Alkaline Phos 25 - 125  73     104      AST 14 - 40  36     58      ALT 10 - 40 U/L  44     74         This result is from an external source.     No results found for: "CEA1", "CEA" / No results found for: "CEA1", "CEA" No results found for: "PSA1" No results found for: "URK270" No results found for: "CAN125"  No results found for: "TOTALPROTELP", "ALBUMINELP", "A1GS", "A2GS", "BETS", "BETA2SER", "GAMS", "MSPIKE", "SPEI" No results found for: "TIBC", "FERRITIN", "IRONPCTSAT" No results found for: "LDH"  STUDIES:  No results found.    HISTORY:   Past Medical History:  Diagnosis Date   Arthritis    Back pain    GERD (gastroesophageal reflux disease)    History of kidney stones    Hypertension    Sleep apnea    does not have cpap yet    Past Surgical History:  Procedure Laterality Date   APPENDECTOMY     HAND SURGERY     LUMBAR LAMINECTOMY/DECOMPRESSION MICRODISCECTOMY N/A 12/28/2017   Procedure: Laminectomy and Foraminotomy Lumbar two-three-Lumbar three-four - Lumbar four-five;  Surgeon: Kary Kos, MD;  Location: [facility];  Service: Neurosurgery;  Laterality: N/A;   OTHER SURGICAL HISTORY     removal on cancer from neck    Family History  Problem Relation Age of Onset   Thyroid disease Mother    Heart disease Father    Prostate cancer Brother    Healthy Daughter    Healthy Son     Social History:  reports that he has been smoking cigarettes. He has a 22.00 pack-year smoking history. He has never used smokeless tobacco. He reports that he does not currently use alcohol. He reports that he does not use drugs.The patient is accompanied by his wife today.  Allergies: No Known Allergies  Current Medications: Current Outpatient Medications  Medication Sig Dispense Refill   amitriptyline (ELAVIL) 75 MG tablet Take 1 tablet by mouth daily.     amLODipine (NORVASC) 10 MG tablet Take 1  tablet by mouth every morning.     aspirin EC 81 MG tablet Take by mouth.     atorvastatin (LIPITOR) 80 MG tablet Take 80 mg by mouth daily.     chlorhexidine (PERIDEX) 0.12 % solution SMARTSIG:1 Capful(s) By Mouth Twice Daily     clindamycin (CLEOCIN) 300 MG capsule Take 300 mg by mouth 2 (two) times daily.     cyanocobalamin (VITAMIN B12) 1000 MCG/ML injection Inject 1 mL every month by subcutaneous route for 1 day.     dexamethasone 0.5 MG/5ML elixir Take by mouth.     fenofibrate 54 MG tablet TAKE ONE TABLET BY MOUTH EVERY DAY FOR 30 DAYS     gabapentin (NEURONTIN) 800 MG tablet Take 800 mg by mouth 4 (four) times daily.     HYDROcodone-acetaminophen (NORCO) 10-325 MG tablet Take by  mouth.     ibuprofen (ADVIL) 800 MG tablet Take by mouth.     ketorolac (TORADOL) 10 MG tablet Take 10 mg by mouth every 6 (six) hours as needed.     lidocaine (XYLOCAINE) 2 % solution SMARTSIG:15 Milliliter(s) By Mouth Every 3 Hours PRN     magic mouthwash SOLN Take 15 mLs by mouth in the morning, at noon, in the evening, and at bedtime.     nitroGLYCERIN (NITROSTAT) 0.4 MG SL tablet PLACE 1 TABLET UNDER THE TONGUE EVERY 5 MIN FOR CHEST PAIN AS NEEDED. NO MORE THAN 3 TABS IN 15 MIN.     nystatin (MYCOSTATIN) 100000 UNIT/ML suspension Swish and swallow 4 (four) times daily     ondansetron (ZOFRAN) 8 MG tablet Take 1 tablet (8 mg total) by mouth every 8 (eight) hours as needed for nausea or vomiting. Start on the third day after chemotherapy. 30 tablet 1   oxyCODONE-acetaminophen (PERCOCET/ROXICET) 5-325 MG tablet Take 1-2 tablets by mouth every 4 (four) hours as needed for severe pain. (Patient not taking: Reported on 01/18/2022) 15 tablet 0   pantoprazole (PROTONIX) 40 MG tablet Take 1 tablet by mouth daily.     phentermine 37.5 MG capsule Take by mouth. (Patient not taking: Reported on 01/12/2022)     predniSONE (DELTASONE) 20 MG tablet Take 20 mg by mouth 2 (two) times daily. (Patient not taking: Reported on  01/12/2022)     prochlorperazine (COMPAZINE) 10 MG tablet Take 1 tablet (10 mg total) by mouth every 6 (six) hours as needed for nausea or vomiting. 30 tablet 1   senna-docusate (SENOKOT-S) 8.6-50 MG tablet Take by mouth.     tiZANidine (ZANAFLEX) 4 MG tablet Take by mouth.     triamterene-hydrochlorothiazide (MAXZIDE-25) 37.5-25 MG tablet Take 1 tablet by mouth daily.     varenicline (CHANTIX) 0.5 MG tablet Take 0.5 mg by mouth 2 (two) times daily. (Patient not taking: Reported on 01/12/2022)     No current facility-administered medications for this visit.

## 2022-02-02 NOTE — Progress Notes (Signed)
Nutrition Follow Up:  Met with Patient and spouses after radiation PUT.  He reports he still not having pain with swallowing, some taste changes but nothing that is inhibiting his PO intake.  States has some intolerance when he first puts food into mouth then it resolves and he's able to eat.   Still eating just once a day and getting calories with ONS. Able to tolerate all textures   Fluids: mostly water, 2 -3 ONS     Nutrition Focused Physical Exam:   Orbital Region: mild depletion Buccal Region: mild depletion Upper Arm Region: WNL Thoracic and Lumbar Region: WNL Temple Region: mild depletion Clavicle Bone Region: WNL  Shoulder and Acromion Bone Region: WNL Scapular Bone Region: WNL Dorsal Hand: WNL Patellar Region: WNL Anterior Thigh Region: WNL Posterior Calf Region: WNL Edema (RD assessment): +1 Bilat LE Hair: WNL Eyes: WNL  Mouth: WNL Skin: WNL  Nails: WNL   Medications: Reviewed    Labs: 02/02/22  Na 136, BUN: 24   Anthropometrics: weight has fluctuated (probably with fluid shifts)   Height: 69"   Weight:  02/02/22  240.6# 01/28/22  243# 12/30/21  237.8# 12/03/21  240.8# UBW: 270# BMI: 35.44     Estimated Energy Needs   Kcals: 2700-3200 Protein: 110-130 Fluid: 3 L     NUTRITION DIAGNOSIS: Inadequate calorie and protein intake r/t decreased portions AEB 24 recall and reported weight loss. Resolving with usual PO and weight remaining within range 237-243#   INTERVENTION:    Educated on importance of adequate calorie and protein energy  Encouraged soft moist high protein foods as well as small frequent meals/snacks -  Encouraged home blended shakes if ONS become too costly, offered recipes but spouse declined. Encouraged increased fluids to goal. Provided samples of Boost HP     MONITORING, EVALUATION, GOAL: weight trends, nutrition impact symptoms, PO intake, labs     Next Visit: Next month In-person after radiation PUT   April Manson, RDN,  LDN Registered Dietitian, [place] (Usual office hours: Tuesday-Thursday) Mobile: [phone]

## 2022-02-03 ENCOUNTER — Encounter: Payer: Self-pay | Admitting: Hematology and Oncology

## 2022-02-03 MED FILL — Dexamethasone Sodium Phosphate Inj 100 MG/10ML: INTRAMUSCULAR | Qty: 1 | Status: AC

## 2022-02-03 MED FILL — Carboplatin IV Soln 450 MG/45ML: INTRAVENOUS | Qty: 30 | Status: AC

## 2022-02-03 MED FILL — Paclitaxel IV Conc 300 MG/50ML (6 MG/ML): INTRAVENOUS | Qty: 17 | Status: AC

## 2022-02-04 ENCOUNTER — Inpatient Hospital Stay: Payer: Medicaid Other | Attending: Hematology and Oncology

## 2022-02-04 VITALS — BP 131/74 | HR 71 | Temp 98.5°F | Resp 18 | Ht 69.0 in | Wt 240.0 lb

## 2022-02-04 DIAGNOSIS — Z5111 Encounter for antineoplastic chemotherapy: Secondary | ICD-10-CM | POA: Insufficient documentation

## 2022-02-04 DIAGNOSIS — C109 Malignant neoplasm of oropharynx, unspecified: Secondary | ICD-10-CM | POA: Insufficient documentation

## 2022-02-04 DIAGNOSIS — F17210 Nicotine dependence, cigarettes, uncomplicated: Secondary | ICD-10-CM | POA: Insufficient documentation

## 2022-02-04 DIAGNOSIS — Z5189 Encounter for other specified aftercare: Secondary | ICD-10-CM | POA: Diagnosis not present

## 2022-02-04 DIAGNOSIS — Z79899 Other long term (current) drug therapy: Secondary | ICD-10-CM | POA: Diagnosis not present

## 2022-02-04 MED ORDER — SODIUM CHLORIDE 0.9 % IV SOLN
45.0000 mg/m2 | Freq: Once | INTRAVENOUS | Status: AC
  Administered 2022-02-04: 102 mg via INTRAVENOUS
  Filled 2022-02-04: qty 17

## 2022-02-04 MED ORDER — SODIUM CHLORIDE 0.9% FLUSH
10.0000 mL | INTRAVENOUS | Status: DC | PRN
  Administered 2022-02-04: 10 mL

## 2022-02-04 MED ORDER — HEPARIN SOD (PORK) LOCK FLUSH 100 UNIT/ML IV SOLN
500.0000 [IU] | Freq: Once | INTRAVENOUS | Status: AC | PRN
  Administered 2022-02-04: 500 [IU]

## 2022-02-04 MED ORDER — SODIUM CHLORIDE 0.9 % IV SOLN
300.0000 mg | Freq: Once | INTRAVENOUS | Status: AC
  Administered 2022-02-04: 300 mg via INTRAVENOUS
  Filled 2022-02-04: qty 30

## 2022-02-04 MED ORDER — SODIUM CHLORIDE 0.9 % IV SOLN
10.0000 mg | Freq: Once | INTRAVENOUS | Status: AC
  Administered 2022-02-04: 10 mg via INTRAVENOUS
  Filled 2022-02-04: qty 10

## 2022-02-04 MED ORDER — DIPHENHYDRAMINE HCL 50 MG/ML IJ SOLN
50.0000 mg | Freq: Once | INTRAMUSCULAR | Status: AC
  Administered 2022-02-04: 50 mg via INTRAVENOUS
  Filled 2022-02-04: qty 1

## 2022-02-04 MED ORDER — FAMOTIDINE IN NACL 20-0.9 MG/50ML-% IV SOLN
20.0000 mg | Freq: Once | INTRAVENOUS | Status: AC
  Administered 2022-02-04: 20 mg via INTRAVENOUS
  Filled 2022-02-04: qty 50

## 2022-02-04 MED ORDER — PALONOSETRON HCL INJECTION 0.25 MG/5ML
0.2500 mg | Freq: Once | INTRAVENOUS | Status: AC
  Administered 2022-02-04: 0.25 mg via INTRAVENOUS
  Filled 2022-02-04: qty 5

## 2022-02-04 MED ORDER — SODIUM CHLORIDE 0.9 % IV SOLN: Freq: Once | INTRAVENOUS | Status: AC

## 2022-02-04 NOTE — Patient Instructions (Signed)
Carboplatin Injection What is this medication? CARBOPLATIN (KAR boe pla tin) treats some types of cancer. It works by slowing down the growth of cancer cells. This medicine may be used for other purposes; ask your health care provider or pharmacist if you have questions. COMMON BRAND NAME(S): Paraplatin What should I tell my care team before I take this medication? They need to know if you have any of these conditions: Blood disorders Hearing problems Kidney disease Recent or ongoing radiation therapy An unusual or allergic reaction to carboplatin, cisplatin, other medications, foods, dyes, or preservatives Pregnant or trying to get pregnant Breast-feeding How should I use this medication? This medication is injected into a vein. It is given by your care team in a hospital or clinic setting. Talk to your care team about the use of this medication in children. Special care may be needed. Overdosage: If you think you have taken too much of this medicine contact a poison control center or emergency room at once. NOTE: This medicine is only for you. Do not share this medicine with others. What if I miss a dose? Keep appointments for follow-up doses. It is important not to miss your dose. Call your care team if you are unable to keep an appointment. What may interact with this medication? Medications for seizures Some antibiotics, such as amikacin, gentamicin, neomycin, streptomycin, tobramycin Vaccines This list may not describe all possible interactions. Give your health care provider a list of all the medicines, herbs, non-prescription drugs, or dietary supplements you use. Also tell them if you smoke, drink alcohol, or use illegal drugs. Some items may interact with your medicine. What should I watch for while using this medication? Your condition will be monitored carefully while you are receiving this medication. You may need blood work while taking this medication. This medication may  make you feel generally unwell. This is not uncommon, as chemotherapy can affect healthy cells as well as cancer cells. Report any side effects. Continue your course of treatment even though you feel ill unless your care team tells you to stop. In some cases, you may be given additional medications to help with side effects. Follow all directions for their use. This medication may increase your risk of getting an infection. Call your care team for advice if you get a fever, chills, sore throat, or other symptoms of a cold or flu. Do not treat yourself. Try to avoid being around people who are sick. Avoid taking medications that contain aspirin, acetaminophen, ibuprofen, naproxen, or ketoprofen unless instructed by your care team. These medications may hide a fever. Be careful brushing or flossing your teeth or using a toothpick because you may get an infection or bleed more easily. If you have any dental work done, tell your dentist you are receiving this medication. Talk to your care team if you wish to become pregnant or think you might be pregnant. This medication can cause serious birth defects. Talk to your care team about effective forms of contraception. Do not breast-feed while taking this medication. What side effects may I notice from receiving this medication? Side effects that you should report to your care team as soon as possible: Allergic reactions--skin rash, itching, hives, swelling of the face, lips, tongue, or throat Infection--fever, chills, cough, sore throat, wounds that don't heal, pain or trouble when passing urine, general feeling of discomfort or being unwell Low red blood cell level--unusual weakness or fatigue, dizziness, headache, trouble breathing Pain, tingling, or numbness in the hands or   feet, muscle weakness, change in vision, confusion or trouble speaking, loss of balance or coordination, trouble walking, seizures Unusual bruising or bleeding Side effects that usually  do not require medical attention (report to your care team if they continue or are bothersome): Hair loss Nausea Unusual weakness or fatigue Vomiting This list may not describe all possible side effects. Call your doctor for medical advice about side effects. You may report side effects to FDA at 1-800-FDA-1088. Where should I keep my medication? This medication is given in a hospital or clinic. It will not be stored at home. NOTE: This sheet is a summary. It may not cover all possible information. If you have questions about this medicine, talk to your doctor, pharmacist, or health care provider.  2023 Elsevier/Gold Standard (2021-07-06 00:00:00) Paclitaxel Injection What is this medication? PACLITAXEL (PAK li TAX el) treats some types of cancer. It works by slowing down the growth of cancer cells. This medicine may be used for other purposes; ask your health care provider or pharmacist if you have questions. COMMON BRAND NAME(S): Onxol, Taxol What should I tell my care team before I take this medication? They need to know if you have any of these conditions: Heart disease Liver disease Low white blood cell levels An unusual or allergic reaction to paclitaxel, other medications, foods, dyes, or preservatives If you or your partner are pregnant or trying to get pregnant Breast-feeding How should I use this medication? This medication is injected into a vein. It is given by your care team in a hospital or clinic setting. Talk to your care team about the use of this medication in children. While it may be given to children for selected conditions, precautions do apply. Overdosage: If you think you have taken too much of this medicine contact a poison control center or emergency room at once. NOTE: This medicine is only for you. Do not share this medicine with others. What if I miss a dose? Keep appointments for follow-up doses. It is important not to miss your dose. Call your care team if  you are unable to keep an appointment. What may interact with this medication? Do not take this medication with any of the following: Live virus vaccines Other medications may affect the way this medication works. Talk with your care team about all of the medications you take. They may suggest changes to your treatment plan to lower the risk of side effects and to make sure your medications work as intended. This list may not describe all possible interactions. Give your health care provider a list of all the medicines, herbs, non-prescription drugs, or dietary supplements you use. Also tell them if you smoke, drink alcohol, or use illegal drugs. Some items may interact with your medicine. What should I watch for while using this medication? Your condition will be monitored carefully while you are receiving this medication. You may need blood work while taking this medication. This medication may make you feel generally unwell. This is not uncommon as chemotherapy can affect healthy cells as well as cancer cells. Report any side effects. Continue your course of treatment even though you feel ill unless your care team tells you to stop. This medication can cause serious allergic reactions. To reduce the risk, your care team may give you other medications to take before receiving this one. Be sure to follow the directions from your care team. This medication may increase your risk of getting an infection. Call your care team for advice if   you get a fever, chills, sore throat, or other symptoms of a cold or flu. Do not treat yourself. Try to avoid being around people who are sick. This medication may increase your risk to bruise or bleed. Call your care team if you notice any unusual bleeding. Be careful brushing or flossing your teeth or using a toothpick because you may get an infection or bleed more easily. If you have any dental work done, tell your dentist you are receiving this medication. Talk to  your care team if you may be pregnant. Serious birth defects can occur if you take this medication during pregnancy. Talk to your care team before breastfeeding. Changes to your treatment plan may be needed. What side effects may I notice from receiving this medication? Side effects that you should report to your care team as soon as possible: Allergic reactions--skin rash, itching, hives, swelling of the face, lips, tongue, or throat Heart rhythm changes--fast or irregular heartbeat, dizziness, feeling faint or lightheaded, chest pain, trouble breathing Increase in blood pressure Infection--fever, chills, cough, sore throat, wounds that don't heal, pain or trouble when passing urine, general feeling of discomfort or being unwell Low blood pressure--dizziness, feeling faint or lightheaded, blurry vision Low red blood cell level--unusual weakness or fatigue, dizziness, headache, trouble breathing Painful swelling, warmth, or redness of the skin, blisters or sores at the infusion site Pain, tingling, or numbness in the hands or feet Slow heartbeat--dizziness, feeling faint or lightheaded, confusion, trouble breathing, unusual weakness or fatigue Unusual bruising or bleeding Side effects that usually do not require medical attention (report to your care team if they continue or are bothersome): Diarrhea Hair loss Joint pain Loss of appetite Muscle pain Nausea Vomiting This list may not describe all possible side effects. Call your doctor for medical advice about side effects. You may report side effects to FDA at 1-800-FDA-1088. Where should I keep my medication? This medication is given in a hospital or clinic. It will not be stored at home. NOTE: This sheet is a summary. It may not cover all possible information. If you have questions about this medicine, talk to your doctor, pharmacist, or health care provider.  2023 Elsevier/Gold Standard (2021-07-22 00:00:00)  

## 2022-02-08 NOTE — Progress Notes (Signed)
[facility]  [name] 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  He is status post radical surgical resection with left partial glossectomy, partial pharyngectomy, radical tonsillectomy and neck dissection..  He had several concerning features on his pathology, including 1 positive node with extranodal extension and focal lymphovascular invasion.  We will plan for concurrent chemoradiation with weekly carboplatin/paclitaxel.  He had his first cycle of chemotherapy on October 4th, but did not get his radiation started.    Treatment has since been on hold, as we felt he had infection.  This did not improve with Augmentin or Cleocin, so a CT face and neck was obtained there was a 26 x 11 x 11 mm soft tissue mass posterior left submandibular gland anterior to the sternocleidomastoid.  Some residual soft tissue was noted on PET scan.  This may be postoperative scar tissue versus residual or recurrent tumor versus infection.  We had him to see Dr. Bella Kennedy last week who recommended starting concurrent chemoradiation as soon as possible.  Dr. Bella Kennedy did continue antibiotic treatment with Clindamycin for 10 more days.  The patient resumed concurrent chemoradiation on October 26th.   He reports persistent pain in the left jaw and neck, but states that the radiation seems to have helped somewhat.  He is mainly doing a liquid diet due to decreased appetite and increased pain with chewing.  He is scheduled to meet with the dietitian today as well.  Overall, he is tolerating treatment well.  He has slightly less swelling, but persistent pain in the right face and neck.  He will continue weekly carboplatin/paclitaxel and continue daily radiation.  I will plan to  see him back in 2 weeks for repeat clinical assessment prior to his 7th cycle.    The patient understands the plans discussed today and is in agreement with them.  He knows to contact our office if he develops concerns prior to his next appointment.     Adah Perl, PA-C  [facility] [address] [place] [address] Dept: [phone] Dept Fax: [phone]   No orders of the defined types were placed in this encounter.     CHIEF COMPLAINT:  CC: Oropharynx cancer  Current Treatment: Concurrent chemoradiation with weekly carboplatin/paclitaxel  HISTORY OF PRESENT ILLNESS:   Oncology History  Oropharyngeal carcinoma (HCC)  11/06/2021 Initial Diagnosis   Oropharyngeal carcinoma (HCC)   11/06/2021 Cancer Staging   Staging form: Pharynx - P16 Negative Oropharynx, AJCC 8th Edition - Clinical stage from 11/06/2021: Stage IVB (cT1, cN3, cM0, p16-) - Signed by Dellia Beckwith, MD on 12/28/2021 Histopathologic type: Squamous cell carcinoma, NOS Stage prefix: Initial diagnosis Histologic grade (G): G2 Histologic grading system: 4 grade system Tumor size (mm): 13 Lymph-vascular invasion (LVI): LVI present/identified, NOS Diagnostic confirmation: Positive histology Specimen type: Excision Staged by: Managing physician Presence of extranodal extension: Present Extent of extranodal extension: Microscopic ENE(+) Distance of extension from the native lymph node capsule to the farthest point of invasion in the extranodal tissue (mm): 6.5 ECOG performance status: Grade 1 Perineural invasion (PNI): Absent Prognostic indicators: 1.3 cm with 1/23 nodes positive -2.3 cm with ENE and focal LVI Stage used in treatment planning: Yes National guidelines used  in treatment planning: Yes Type of national guideline used in treatment planning: NCCN   01/06/2022 -  Chemotherapy   Patient is on Treatment Plan : HEAD/NECK  Carboplatin + Paclitaxel + XRT q7d     01/19/2022 Imaging   CT neck:  IMPRESSION:  1. 26 x 11 x 11 mm soft tissue mass posterior to the left  submandibular gland and anterior to the SCM. Some residual soft  tissue is noted in the PET scan. While this may represent  postoperative scar tissue, it is concerning for residual or  recurrent tumor.  2. Asymmetric soft tissue at the left glossal tonsillar sulcus with  focal surgical clips. No definite mass lesion is present.  3. Left carotid stent is in place. Vessel appears patent.  4. Atherosclerotic calcifications at the right carotid bifurcation  with what appears to be a high-grade proximal right ICA stenosis.  5. Right IJ Port-A-Cath in place.        INTERVAL HISTORY:  Salar is here today for repeat clinical assessment prior to 5th cycle of carboplatin/paclitaxel.  He continues to report swelling and pain in the left face and neck, which waxes and wanes compared to previous.  Nothing has been effective for his pain.  It still makes it difficult for him to eat.  He is otherwise tolerating chemoradiation fairly well.  He denies fevers or chills. He denies pain. His appetite is decreased. His weight has decreased 2 pounds over last week .  He continues to work.  He will meet with the dietitian today as well.  REVIEW OF SYSTEMS:  Review of Systems  Constitutional:  Positive for appetite change, fatigue and unexpected weight change. Negative for chills and fever.  HENT:   Negative for lump/mass, mouth sores and sore throat.   Respiratory:  Negative for cough and shortness of breath.   Cardiovascular:  Negative for chest pain and leg swelling.  Gastrointestinal:  Negative for abdominal pain, constipation, diarrhea, nausea and vomiting.  Genitourinary:  Negative for difficulty urinating, dysuria, frequency and hematuria.   Musculoskeletal:  Positive for neck pain. Negative for arthralgias, back pain and myalgias.  Skin:  Negative for  itching, rash and wound.  Neurological:  Negative for dizziness, extremity weakness, headaches, light-headedness and numbness.  Hematological:  Negative for adenopathy.  Psychiatric/Behavioral:  Negative for depression and sleep disturbance. The patient is not nervous/anxious.      VITALS:  Blood pressure (!) 142/81, pulse 77, temperature 98.5 F (36.9 C), temperature source Oral, resp. rate 18, height 5\' 9"  (1.753 m), weight 238 lb 3.2 oz (108 kg), SpO2 98 %.  Wt Readings from Last 3 Encounters:  02/09/22 238 lb 3.2 oz (108 kg)  02/04/22 240 lb (108.9 kg)  02/02/22 240 lb 9.6 oz (109.1 kg)    Body mass index is 35.18 kg/m.  Performance status (ECOG): 1 - Symptomatic but completely ambulatory  PHYSICAL EXAM:  Physical Exam Vitals and nursing note reviewed.  Constitutional:      General: He is not in acute distress.    Appearance: Normal appearance. He is normal weight.  HENT:     Head: Normocephalic and atraumatic.     Jaw: Tenderness present.     Comments: Persistent left facial swelling and with tenderness of the upper and lower jaw including the postauricular area    Mouth/Throat:     Mouth: Mucous membranes are moist.     Tongue: No lesions.     Pharynx: Oropharynx is clear. No oropharyngeal exudate or  posterior oropharyngeal erythema.  Eyes:     General: No scleral icterus.    Extraocular Movements: Extraocular movements intact.     Conjunctiva/sclera: Conjunctivae normal.     Pupils: Pupils are equal, round, and reactive to light.  Cardiovascular:     Rate and Rhythm: Normal rate and regular rhythm.     Heart sounds: Normal heart sounds. No murmur heard.    No friction rub. No gallop.  Pulmonary:     Effort: Pulmonary effort is normal.     Breath sounds: Normal breath sounds. No wheezing, rhonchi or rales.  Abdominal:     General: Bowel sounds are normal. There is no distension.     Palpations: Abdomen is soft. There is no hepatomegaly, splenomegaly or mass.      Tenderness: There is no abdominal tenderness.  Musculoskeletal:        General: Normal range of motion.     Cervical back: Normal range of motion and neck supple. No tenderness.     Right lower leg: No edema.     Left lower leg: No edema.  Lymphadenopathy:     Cervical: No cervical adenopathy.     Upper Body:     Right upper body: No supraclavicular or axillary adenopathy.     Left upper body: No supraclavicular or axillary adenopathy.  Skin:    General: Skin is warm and dry.     Coloration: Skin is not jaundiced.     Findings: No rash.  Neurological:     Mental Status: He is alert and oriented to person, place, and time.     Cranial Nerves: No cranial nerve deficit.  Psychiatric:        Mood and Affect: Mood normal.        Behavior: Behavior normal.        Thought Content: Thought content normal.    LABS:      Latest Ref Rng & Units 02/09/2022   10:15 AM 02/02/2022   12:00 AM 01/26/2022   12:00 AM  CBC  WBC 4.0 - 10.5 K/uL 3.6  4.6     4.6      Hemoglobin 13.0 - 17.0 g/dL 57.8  46.9     62.9      Hematocrit 39.0 - 52.0 % 42.0  39     41      Platelets 150 - 400 K/uL 185  136     182         This result is from an external source.      Latest Ref Rng & Units 02/09/2022   10:15 AM 02/02/2022   12:00 AM 01/26/2022   12:00 AM  CMP  Glucose 70 - 99 mg/dL 98     BUN 6 - 20 mg/dL 16  24     14       Creatinine 0.61 - 1.24 mg/dL 5.28  0.6     0.7      Sodium 135 - 145 mmol/L 142  136     136      Potassium 3.5 - 5.1 mmol/L 4.1  3.8     3.6      Chloride 98 - 111 mmol/L 103  100     100      CO2 22 - 32 mmol/L 25  28     29       Calcium 8.9 - 10.3 mg/dL 9.6  9.6     9.9      Total Protein 6.5 -  8.1 g/dL 6.7     Total Bilirubin 0.3 - 1.2 mg/dL 0.5     Alkaline Phos 38 - 126 U/L 54   73      AST 15 - 41 U/L 22   36      ALT 0 - 44 U/L 25   44         This result is from an external source.     No results found for: "CEA1", "CEA" / No results found for: "CEA1",  "CEA" No results found for: "PSA1" No results found for: "ZOX096" No results found for: "CAN125"  No results found for: "TOTALPROTELP", "ALBUMINELP", "A1GS", "A2GS", "BETS", "BETA2SER", "GAMS", "MSPIKE", "SPEI" No results found for: "TIBC", "FERRITIN", "IRONPCTSAT" No results found for: "LDH"  STUDIES:  No results found.    HISTORY:   Past Medical History:  Diagnosis Date   Arthritis    Back pain    GERD (gastroesophageal reflux disease)    History of kidney stones    Hypertension    Sleep apnea    does not have cpap yet    Past Surgical History:  Procedure Laterality Date   APPENDECTOMY     HAND SURGERY     LUMBAR LAMINECTOMY/DECOMPRESSION MICRODISCECTOMY N/A 12/28/2017   Procedure: Laminectomy and Foraminotomy Lumbar two-three-Lumbar three-four - Lumbar four-five;  Surgeon: Donalee Citrin, MD;  Location: [facility] OR;  Service: Neurosurgery;  Laterality: N/A;   OTHER SURGICAL HISTORY     removal on cancer from neck    Family History  Problem Relation Age of Onset   Thyroid disease Mother    Heart disease Father    Prostate cancer Brother    Healthy Daughter    Healthy Son     Social History:  reports that he has been smoking cigarettes. He has a 22.00 pack-year smoking history. He has never used smokeless tobacco. He reports that he does not currently use alcohol. He reports that he does not use drugs.The patient is accompanied by his wife today.  Allergies: No Known Allergies  Current Medications: Current Outpatient Medications  Medication Sig Dispense Refill   amitriptyline (ELAVIL) 75 MG tablet Take 1 tablet by mouth daily.     amLODipine (NORVASC) 10 MG tablet Take 1 tablet by mouth every morning.     aspirin EC 81 MG tablet Take by mouth.     atorvastatin (LIPITOR) 80 MG tablet Take 80 mg by mouth daily.     chlorhexidine (PERIDEX) 0.12 % solution SMARTSIG:1 Capful(s) By Mouth Twice Daily     clindamycin (CLEOCIN) 300 MG capsule Take 300 mg by mouth 2 (two) times  daily.     cyanocobalamin (VITAMIN B12) 1000 MCG/ML injection Inject 1 mL every month by subcutaneous route for 1 day.     dexamethasone 0.5 MG/5ML elixir Take by mouth.     fenofibrate 54 MG tablet TAKE ONE TABLET BY MOUTH EVERY DAY FOR 30 DAYS     gabapentin (NEURONTIN) 800 MG tablet Take 800 mg by mouth 4 (four) times daily.     HYDROcodone-acetaminophen (NORCO) 10-325 MG tablet Take by mouth.     ibuprofen (ADVIL) 800 MG tablet Take by mouth.     ketorolac (TORADOL) 10 MG tablet Take 10 mg by mouth every 6 (six) hours as needed.     lidocaine (XYLOCAINE) 2 % solution SMARTSIG:15 Milliliter(s) By Mouth Every 3 Hours PRN     magic mouthwash SOLN Take 15 mLs by mouth in the morning, at noon, in the evening,  and at bedtime.     nitroGLYCERIN (NITROSTAT) 0.4 MG SL tablet PLACE 1 TABLET UNDER THE TONGUE EVERY 5 MIN FOR CHEST PAIN AS NEEDED. NO MORE THAN 3 TABS IN 15 MIN.     nystatin (MYCOSTATIN) 100000 UNIT/ML suspension Swish and swallow 4 (four) times daily     ondansetron (ZOFRAN) 8 MG tablet Take 1 tablet (8 mg total) by mouth every 8 (eight) hours as needed for nausea or vomiting. Start on the third day after chemotherapy. 30 tablet 1   oxyCODONE-acetaminophen (PERCOCET/ROXICET) 5-325 MG tablet Take 1-2 tablets by mouth every 4 (four) hours as needed for severe pain. (Patient not taking: Reported on 01/18/2022) 15 tablet 0   pantoprazole (PROTONIX) 40 MG tablet Take 1 tablet by mouth daily.     phentermine 37.5 MG capsule Take by mouth. (Patient not taking: Reported on 01/12/2022)     predniSONE (DELTASONE) 20 MG tablet Take 20 mg by mouth 2 (two) times daily. (Patient not taking: Reported on 01/12/2022)     prochlorperazine (COMPAZINE) 10 MG tablet Take 1 tablet (10 mg total) by mouth every 6 (six) hours as needed for nausea or vomiting. 30 tablet 1   senna-docusate (SENOKOT-S) 8.6-50 MG tablet Take by mouth.     tiZANidine (ZANAFLEX) 4 MG tablet Take by mouth.      triamterene-hydrochlorothiazide (MAXZIDE-25) 37.5-25 MG tablet Take 1 tablet by mouth daily.     varenicline (CHANTIX) 0.5 MG tablet Take 0.5 mg by mouth 2 (two) times daily. (Patient not taking: Reported on 01/12/2022)     No current facility-administered medications for this visit.

## 2022-02-08 NOTE — Assessment & Plan Note (Signed)
Stage IVB oropharyngeal carcinoma, his left tongue and left tonsil, p16 negative.  He is status post radical surgical resection with left partial glossectomy, partial pharyngectomy, radical tonsillectomy and neck dissection..  He had several concerning features on his pathology, including 1 positive node with extranodal extension and focal lymphovascular invasion.  We will plan for concurrent chemoradiation with weekly carboplatin/paclitaxel.  He had his first cycle of chemotherapy on October 4th, but did not get his radiation started.    Treatment has since been on hold, as we felt he had infection.  This did not improve with Augmentin or Cleocin, so a CT face and neck was obtained there was a 26 x 11 x 11 mm soft tissue mass posterior left submandibular gland anterior to the sternocleidomastoid.  Some residual soft tissue was noted on PET scan.  This may be postoperative scar tissue versus residual or recurrent tumor versus infection.  We had him to see Dr. Beverlee Nims last week who recommended starting concurrent chemoradiation as soon as possible.  Dr. Beverlee Nims did continue antibiotic treatment with Clindamycin for 10 more days.  The patient resumed concurrent chemoradiation on October 26th.   He reports persistent pain in the left jaw and neck, but states that the radiation seems to have helped somewhat.  He is mainly doing a liquid diet due to decreased appetite and increased pain with chewing.  He is scheduled to meet with the dietitian today as well.  Overall, he is tolerating treatment well.  He has slightly less swelling, but persistent pain in the right face and neck.  He will continue weekly carboplatin/paclitaxel and continue daily radiation.  I will plan to see him back in 2 weeks for repeat clinical assessment prior to his 7th cycle.

## 2022-02-09 ENCOUNTER — Inpatient Hospital Stay: Payer: Medicaid Other

## 2022-02-09 ENCOUNTER — Telehealth: Payer: Self-pay

## 2022-02-09 ENCOUNTER — Inpatient Hospital Stay (INDEPENDENT_AMBULATORY_CARE_PROVIDER_SITE_OTHER): Payer: Medicaid Other | Admitting: Hematology and Oncology

## 2022-02-09 ENCOUNTER — Inpatient Hospital Stay: Payer: Medicaid Other | Admitting: Dietician

## 2022-02-09 ENCOUNTER — Encounter: Payer: Self-pay | Admitting: Hematology and Oncology

## 2022-02-09 VITALS — BP 142/81 | HR 77 | Temp 98.5°F | Resp 18 | Ht 69.0 in | Wt 238.2 lb

## 2022-02-09 DIAGNOSIS — C109 Malignant neoplasm of oropharynx, unspecified: Secondary | ICD-10-CM

## 2022-02-09 DIAGNOSIS — Z5111 Encounter for antineoplastic chemotherapy: Secondary | ICD-10-CM | POA: Diagnosis not present

## 2022-02-09 LAB — CMP (CANCER CENTER ONLY)
ALT: 25 U/L (ref 0–44)
AST: 22 U/L (ref 15–41)
Albumin: 4 g/dL (ref 3.5–5.0)
Alkaline Phosphatase: 54 U/L (ref 38–126)
Anion gap: 14 (ref 5–15)
BUN: 16 mg/dL (ref 6–20)
CO2: 25 mmol/L (ref 22–32)
Calcium: 9.6 mg/dL (ref 8.9–10.3)
Chloride: 103 mmol/L (ref 98–111)
Creatinine: 0.68 mg/dL (ref 0.61–1.24)
GFR, Estimated: >60 mL/min (ref >60)
Glucose, Bld: 98 mg/dL (ref 70–99)
Potassium: 4.1 mmol/L (ref 3.5–5.1)
Sodium: 142 mmol/L (ref 135–145)
Total Bilirubin: 0.5 mg/dL (ref 0.3–1.2)
Total Protein: 6.7 g/dL (ref 6.5–8.1)

## 2022-02-09 LAB — CBC WITH DIFFERENTIAL (CANCER CENTER ONLY)
Abs Immature Granulocytes: 0.01 10*3/uL (ref 0.00–0.07)
Basophils Absolute: 0 10*3/uL (ref 0.0–0.1)
Basophils Relative: 0 %
Eosinophils Absolute: 0 10*3/uL (ref 0.0–0.5)
Eosinophils Relative: 1 %
HCT: 42 % (ref 39.0–52.0)
Hemoglobin: 14 g/dL (ref 13.0–17.0)
Immature Granulocytes: 0 %
Lymphocytes Relative: 37 %
Lymphs Abs: 1.3 10*3/uL (ref 0.7–4.0)
MCH: 32.6 pg (ref 26.0–34.0)
MCHC: 33.3 g/dL (ref 30.0–36.0)
MCV: 97.9 fL (ref 80.0–100.0)
Monocytes Absolute: 0.2 10*3/uL (ref 0.1–1.0)
Monocytes Relative: 6 %
Neutro Abs: 2 10*3/uL (ref 1.7–7.7)
Neutrophils Relative %: 56 %
Platelet Count: 185 10*3/uL (ref 150–400)
RBC: 4.29 MIL/uL (ref 4.22–5.81)
RDW: 14.1 % (ref 11.5–15.5)
WBC Count: 3.6 10*3/uL — ABNORMAL LOW (ref 4.0–10.5)
nRBC: 0 % (ref 0.0–0.2)

## 2022-02-09 NOTE — Telephone Encounter (Signed)
-----   Message from [name], PA-C sent at 02/09/2022  2:06 PM EST ----- Please let him know his labs are good for treatment this week. Thanks

## 2022-02-09 NOTE — Progress Notes (Signed)
Nutrition Follow Up:  Met with patient and spouses after radiation treatment.  He reports little change with PO intake.  He continues to have issues when first putting food into mouth but it resolves and he only has pain wit chewing but does not have pain with swallowing.  He has some taste changes but he is able to continue drinking his nourishment.   He continues to say he wants to loose weight.  He is drinking ONS and only uses PEG to flush daily.    Fluids: mostly water, 2 -3 ONS   Medications: Reviewed   Labs: Reviewed    Anthropometrics: weight has fluctuated (probably with fluid shifts)   Height: 69"   Weight:  02/09/22  238# 02/02/22  240.6# 01/28/22  243# 12/30/21  237.8# 12/03/21  240.8# UBW: 270# BMI: 35.44     Estimated Energy Needs   Kcals: 2700-3200 Protein: 110-130 Fluid: 3 L     NUTRITION DIAGNOSIS: Inadequate calorie and protein intake r/t decreased portions AEB 24 recall and reported weight loss. PO hasn't changed and weight remaining within range 237-243#   INTERVENTION:    Provided samples of more ONS to try with coupons [employer], [place], [employer], and Ensure complete). Also provided samples of [place] to try either PO or though tube.     MONITORING, EVALUATION, GOAL: weight trends, nutrition impact symptoms, PO intake, labs     Next Visit: Remote next week after radiation PUT   April Manson, RDN, LDN Registered Dietitian, [place] (Usual office hours: Tuesday-Thursday) Mobile: [phone]

## 2022-02-09 NOTE — Telephone Encounter (Signed)
Left message on wife's cell not able to leave message on Kenneths cell or house phone.

## 2022-02-10 MED FILL — Paclitaxel IV Conc 300 MG/50ML (6 MG/ML): INTRAVENOUS | Qty: 17 | Status: AC

## 2022-02-10 MED FILL — Carboplatin IV Soln 450 MG/45ML: INTRAVENOUS | Qty: 30 | Status: AC

## 2022-02-10 MED FILL — Dexamethasone Sodium Phosphate Inj 100 MG/10ML: INTRAMUSCULAR | Qty: 1 | Status: AC

## 2022-02-11 ENCOUNTER — Inpatient Hospital Stay: Payer: Medicaid Other

## 2022-02-11 DIAGNOSIS — Z5111 Encounter for antineoplastic chemotherapy: Secondary | ICD-10-CM | POA: Diagnosis not present

## 2022-02-11 DIAGNOSIS — C109 Malignant neoplasm of oropharynx, unspecified: Secondary | ICD-10-CM

## 2022-02-11 MED ORDER — FAMOTIDINE IN NACL 20-0.9 MG/50ML-% IV SOLN
20.0000 mg | Freq: Once | INTRAVENOUS | Status: AC
  Administered 2022-02-11: 20 mg via INTRAVENOUS
  Filled 2022-02-11: qty 50

## 2022-02-11 MED ORDER — SODIUM CHLORIDE 0.9 % IV SOLN
300.0000 mg | Freq: Once | INTRAVENOUS | Status: AC
  Administered 2022-02-11: 300 mg via INTRAVENOUS
  Filled 2022-02-11: qty 30

## 2022-02-11 MED ORDER — DIPHENHYDRAMINE HCL 50 MG/ML IJ SOLN
25.0000 mg | Freq: Once | INTRAMUSCULAR | Status: AC
  Administered 2022-02-11: 25 mg via INTRAVENOUS
  Filled 2022-02-11: qty 1

## 2022-02-11 MED ORDER — SODIUM CHLORIDE 0.9 % IV SOLN
45.0000 mg/m2 | Freq: Once | INTRAVENOUS | Status: AC
  Administered 2022-02-11: 102 mg via INTRAVENOUS
  Filled 2022-02-11: qty 17

## 2022-02-11 MED ORDER — HEPARIN SOD (PORK) LOCK FLUSH 100 UNIT/ML IV SOLN
500.0000 [IU] | Freq: Once | INTRAVENOUS | Status: AC | PRN
  Administered 2022-02-11: 500 [IU]

## 2022-02-11 MED ORDER — SODIUM CHLORIDE 0.9 % IV SOLN: Freq: Once | INTRAVENOUS | Status: AC

## 2022-02-11 MED ORDER — SODIUM CHLORIDE 0.9% FLUSH
10.0000 mL | INTRAVENOUS | Status: DC | PRN
  Administered 2022-02-11: 10 mL

## 2022-02-11 MED ORDER — SODIUM CHLORIDE 0.9 % IV SOLN
10.0000 mg | Freq: Once | INTRAVENOUS | Status: AC
  Administered 2022-02-11: 10 mg via INTRAVENOUS
  Filled 2022-02-11: qty 1

## 2022-02-11 MED ORDER — PALONOSETRON HCL INJECTION 0.25 MG/5ML
0.2500 mg | Freq: Once | INTRAVENOUS | Status: AC
  Administered 2022-02-11: 0.25 mg via INTRAVENOUS
  Filled 2022-02-11: qty 5

## 2022-02-11 NOTE — Patient Instructions (Signed)
Paclitaxel Injection What is this medication? PACLITAXEL (PAK li TAX el) treats some types of cancer. It works by slowing down the growth of cancer cells. This medicine may be used for other purposes; ask your health care provider or pharmacist if you have questions. COMMON BRAND NAME(S): Onxol, Taxol What should I tell my care team before I take this medication? They need to know if you have any of these conditions: Heart disease Liver disease Low white blood cell levels An unusual or allergic reaction to paclitaxel, other medications, foods, dyes, or preservatives If you or your partner are pregnant or trying to get pregnant Breast-feeding How should I use this medication? This medication is injected into a vein. It is given by your care team in a hospital or clinic setting. Talk to your care team about the use of this medication in children. While it may be given to children for selected conditions, precautions do apply. Overdosage: If you think you have taken too much of this medicine contact a poison control center or emergency room at once. NOTE: This medicine is only for you. Do not share this medicine with others. What if I miss a dose? Keep appointments for follow-up doses. It is important not to miss your dose. Call your care team if you are unable to keep an appointment. What may interact with this medication? Do not take this medication with any of the following: Live virus vaccines Other medications may affect the way this medication works. Talk with your care team about all of the medications you take. They may suggest changes to your treatment plan to lower the risk of side effects and to make sure your medications work as intended. This list may not describe all possible interactions. Give your health care provider a list of all the medicines, herbs, non-prescription drugs, or dietary supplements you use. Also tell them if you smoke, drink alcohol, or use illegal drugs. Some  items may interact with your medicine. What should I watch for while using this medication? Your condition will be monitored carefully while you are receiving this medication. You may need blood work while taking this medication. This medication may make you feel generally unwell. This is not uncommon as chemotherapy can affect healthy cells as well as cancer cells. Report any side effects. Continue your course of treatment even though you feel ill unless your care team tells you to stop. This medication can cause serious allergic reactions. To reduce the risk, your care team may give you other medications to take before receiving this one. Be sure to follow the directions from your care team. This medication may increase your risk of getting an infection. Call your care team for advice if you get a fever, chills, sore throat, or other symptoms of a cold or flu. Do not treat yourself. Try to avoid being around people who are sick. This medication may increase your risk to bruise or bleed. Call your care team if you notice any unusual bleeding. Be careful brushing or flossing your teeth or using a toothpick because you may get an infection or bleed more easily. If you have any dental work done, tell your dentist you are receiving this medication. Talk to your care team if you may be pregnant. Serious birth defects can occur if you take this medication during pregnancy. Talk to your care team before breastfeeding. Changes to your treatment plan may be needed. What side effects may I notice from receiving this medication? Side effects that you   should report to your care team as soon as possible: Allergic reactions--skin rash, itching, hives, swelling of the face, lips, tongue, or throat Heart rhythm changes--fast or irregular heartbeat, dizziness, feeling faint or lightheaded, chest pain, trouble breathing Increase in blood pressure Infection--fever, chills, cough, sore throat, wounds that don't heal,  pain or trouble when passing urine, general feeling of discomfort or being unwell Low blood pressure--dizziness, feeling faint or lightheaded, blurry vision Low red blood cell level--unusual weakness or fatigue, dizziness, headache, trouble breathing Painful swelling, warmth, or redness of the skin, blisters or sores at the infusion site Pain, tingling, or numbness in the hands or feet Slow heartbeat--dizziness, feeling faint or lightheaded, confusion, trouble breathing, unusual weakness or fatigue Unusual bruising or bleeding Side effects that usually do not require medical attention (report to your care team if they continue or are bothersome): Diarrhea Hair loss Joint pain Loss of appetite Muscle pain Nausea Vomiting This list may not describe all possible side effects. Call your doctor for medical advice about side effects. You may report side effects to FDA at 1-800-FDA-1088. Where should I keep my medication? This medication is given in a hospital or clinic. It will not be stored at home. NOTE: This sheet is a summary. It may not cover all possible information. If you have questions about this medicine, talk to your doctor, pharmacist, or health care provider.  2023 [employer] Standard (2021-07-22 00:00:00) Carboplatin Injection What is this medication? CARBOPLATIN (KAR boe pla tin) treats some types of cancer. It works by slowing down the growth of cancer cells. This medicine may be used for other purposes; ask your health care provider or pharmacist if you have questions. COMMON BRAND NAME(S): Paraplatin What should I tell my care team before I take this medication? They need to know if you have any of these conditions: Blood disorders Hearing problems Kidney disease Recent or ongoing radiation therapy An unusual or allergic reaction to carboplatin, cisplatin, other medications, foods, dyes, or preservatives Pregnant or trying to get pregnant Breast-feeding How should I  use this medication? This medication is injected into a vein. It is given by your care team in a hospital or clinic setting. Talk to your care team about the use of this medication in children. Special care may be needed. Overdosage: If you think you have taken too much of this medicine contact a poison control center or emergency room at once. NOTE: This medicine is only for you. Do not share this medicine with others. What if I miss a dose? Keep appointments for follow-up doses. It is important not to miss your dose. Call your care team if you are unable to keep an appointment. What may interact with this medication? Medications for seizures Some antibiotics, such as amikacin, gentamicin, neomycin, streptomycin, tobramycin Vaccines This list may not describe all possible interactions. Give your health care provider a list of all the medicines, herbs, non-prescription drugs, or dietary supplements you use. Also tell them if you smoke, drink alcohol, or use illegal drugs. Some items may interact with your medicine. What should I watch for while using this medication? Your condition will be monitored carefully while you are receiving this medication. You may need blood work while taking this medication. This medication may make you feel generally unwell. This is not uncommon, as chemotherapy can affect healthy cells as well as cancer cells. Report any side effects. Continue your course of treatment even though you feel ill unless your care team tells you to stop. In   some cases, you may be given additional medications to help with side effects. Follow all directions for their use. This medication may increase your risk of getting an infection. Call your care team for advice if you get a fever, chills, sore throat, or other symptoms of a cold or flu. Do not treat yourself. Try to avoid being around people who are sick. Avoid taking medications that contain aspirin, acetaminophen, ibuprofen, naproxen,  or ketoprofen unless instructed by your care team. These medications may hide a fever. Be careful brushing or flossing your teeth or using a toothpick because you may get an infection or bleed more easily. If you have any dental work done, tell your dentist you are receiving this medication. Talk to your care team if you wish to become pregnant or think you might be pregnant. This medication can cause serious birth defects. Talk to your care team about effective forms of contraception. Do not breast-feed while taking this medication. What side effects may I notice from receiving this medication? Side effects that you should report to your care team as soon as possible: Allergic reactions--skin rash, itching, hives, swelling of the face, lips, tongue, or throat Infection--fever, chills, cough, sore throat, wounds that don't heal, pain or trouble when passing urine, general feeling of discomfort or being unwell Low red blood cell level--unusual weakness or fatigue, dizziness, headache, trouble breathing Pain, tingling, or numbness in the hands or feet, muscle weakness, change in vision, confusion or trouble speaking, loss of balance or coordination, trouble walking, seizures Unusual bruising or bleeding Side effects that usually do not require medical attention (report to your care team if they continue or are bothersome): Hair loss Nausea Unusual weakness or fatigue Vomiting This list may not describe all possible side effects. Call your doctor for medical advice about side effects. You may report side effects to FDA at 1-800-FDA-1088. Where should I keep my medication? This medication is given in a hospital or clinic. It will not be stored at home. NOTE: This sheet is a summary. It may not cover all possible information. If you have questions about this medicine, talk to your doctor, pharmacist, or health care provider.  2023 [employer]/Gold Standard (2021-07-06 00:00:00)  

## 2022-02-16 ENCOUNTER — Inpatient Hospital Stay (INDEPENDENT_AMBULATORY_CARE_PROVIDER_SITE_OTHER): Payer: Medicaid Other | Admitting: Hematology and Oncology

## 2022-02-16 ENCOUNTER — Encounter: Payer: Self-pay | Admitting: Hematology and Oncology

## 2022-02-16 ENCOUNTER — Inpatient Hospital Stay: Payer: Medicaid Other

## 2022-02-16 DIAGNOSIS — C109 Malignant neoplasm of oropharynx, unspecified: Secondary | ICD-10-CM

## 2022-02-16 DIAGNOSIS — Z5111 Encounter for antineoplastic chemotherapy: Secondary | ICD-10-CM | POA: Diagnosis not present

## 2022-02-16 LAB — CMP (CANCER CENTER ONLY)
ALT: 25 U/L (ref 0–44)
AST: 22 U/L (ref 15–41)
Albumin: 3.9 g/dL (ref 3.5–5.0)
Alkaline Phosphatase: 64 U/L (ref 38–126)
Anion gap: 5 (ref 5–15)
BUN: 17 mg/dL (ref 6–20)
CO2: 30 mmol/L (ref 22–32)
Calcium: 9.4 mg/dL (ref 8.9–10.3)
Chloride: 108 mmol/L (ref 98–111)
Creatinine: 0.69 mg/dL (ref 0.61–1.24)
GFR, Estimated: >60 mL/min (ref >60)
Glucose, Bld: 90 mg/dL (ref 70–99)
Potassium: 4.1 mmol/L (ref 3.5–5.1)
Sodium: 143 mmol/L (ref 135–145)
Total Bilirubin: 0.5 mg/dL (ref 0.3–1.2)
Total Protein: 6.9 g/dL (ref 6.5–8.1)

## 2022-02-16 LAB — CBC WITH DIFFERENTIAL (CANCER CENTER ONLY)
Abs Immature Granulocytes: 0.01 10*3/uL (ref 0.00–0.07)
Basophils Absolute: 0 10*3/uL (ref 0.0–0.1)
Basophils Relative: 0 %
Eosinophils Absolute: 0 10*3/uL (ref 0.0–0.5)
Eosinophils Relative: 1 %
HCT: 38.6 % — ABNORMAL LOW (ref 39.0–52.0)
Hemoglobin: 13.1 g/dL (ref 13.0–17.0)
Immature Granulocytes: 0 %
Lymphocytes Relative: 42 %
Lymphs Abs: 1.4 10*3/uL (ref 0.7–4.0)
MCH: 33.2 pg (ref 26.0–34.0)
MCHC: 33.9 g/dL (ref 30.0–36.0)
MCV: 97.7 fL (ref 80.0–100.0)
Monocytes Absolute: 0.2 10*3/uL (ref 0.1–1.0)
Monocytes Relative: 6 %
Neutro Abs: 1.6 10*3/uL — ABNORMAL LOW (ref 1.7–7.7)
Neutrophils Relative %: 51 %
Platelet Count: 196 10*3/uL (ref 150–400)
RBC: 3.95 MIL/uL — ABNORMAL LOW (ref 4.22–5.81)
RDW: 14.2 % (ref 11.5–15.5)
WBC Count: 3.2 10*3/uL — ABNORMAL LOW (ref 4.0–10.5)
nRBC: 0 % (ref 0.0–0.2)

## 2022-02-16 NOTE — Assessment & Plan Note (Addendum)
Stage IVB oropharyngeal carcinoma, his left tongue and left tonsil, p16 negative.  He is status post radical surgical resection with left partial glossectomy, partial pharyngectomy, radical tonsillectomy and neck dissection..  He had several concerning features on his pathology, including 1 positive node with extranodal extension and focal lymphovascular invasion.  We will plan for concurrent chemoradiation with weekly carboplatin/paclitaxel.  He had his first cycle of chemotherapy on October 4th, but did not get his radiation started.    Treatment was placed on hold, as we felt he had infection.  This did not improve with Augmentin or Cleocin, so a CT face and neck was obtained there was a 26 x 11 x 11 mm soft tissue mass posterior left submandibular gland anterior to the sternocleidomastoid.  Some residual soft tissue uptake was noted on his post operative PET scan.  This may represent postoperative scar tissue versus residual or recurrent tumor versus infection.  We had him to see Dr. Beverlee Nims last week who recommended starting concurrent chemoradiation as soon as possible.  Dr. Beverlee Nims did continue antibiotic treatment with clindamycin for 10 more days.  The patient resumed concurrent chemoradiation on October 26th.   He reports persistent pain in the left jaw and neck.  He is mainly doing a liquid diet due to decreased appetite and increased pain with chewing.  He is scheduled to meet with the dietitian today as well.  Overall, he is tolerating treatment well.  He has slightly less swelling, but persistent pain in the right face and neck.  He will proceed with a 6th cycle of carboplatin/paclitaxel this week.  We will plan to see him back in 1 week for repeat clinical assessment prior to his seventh cycle.

## 2022-02-16 NOTE — Progress Notes (Signed)
[facility]  [address] [place],  [place]  [address] [phone]  Clinic Day:  02/16/2022  Referring physician: [name], MD  ASSESSMENT & PLAN:   Assessment & Plan: Oropharyngeal carcinoma ([place]) Stage IVB oropharyngeal carcinoma, his left tongue and left tonsil, p16 negative.  He is status post radical surgical resection with left partial glossectomy, partial pharyngectomy, radical tonsillectomy and neck dissection..  He had several concerning features on his pathology, including 1 positive node with extranodal extension and focal lymphovascular invasion.  We will plan for concurrent chemoradiation with weekly carboplatin/paclitaxel.  He had his first cycle of chemotherapy on October 4th, but did not get his radiation started.    Treatment was placed on hold, as we felt he had infection.  This did not improve with Augmentin or Cleocin, so a CT face and neck was obtained there was a 26 x 11 x 11 mm soft tissue mass posterior left submandibular gland anterior to the sternocleidomastoid.  Some residual soft tissue uptake was noted on his post operative PET scan.  This may represent postoperative scar tissue versus residual or recurrent tumor versus infection.  We had him to see Dr. Beverlee Nims last week who recommended starting concurrent chemoradiation as soon as possible.  Dr. Beverlee Nims did continue antibiotic treatment with clindamycin for 10 more days.  The patient resumed concurrent chemoradiation on October 26th.   He reports persistent pain in the left jaw and neck.  He is mainly doing a liquid diet due to decreased appetite and increased pain with chewing.  He is scheduled to meet with the dietitian today as well.  Overall, he is tolerating treatment well.  He has slightly less swelling, but persistent pain in the right face and neck.  He will proceed with a 6th cycle of carboplatin/paclitaxel this week.  We will plan to see him back in 1 week for repeat  clinical assessment prior to his seventh cycle.   The patient understands the plans discussed today and is in agreement with them.  He knows to contact our office if he develops concerns prior to his next appointment.   I provided 10 minutes of face-to-face time during this encounter and > 50% was spent counseling as documented under my assessment and plan.    [name], PA-C  [facility] AT [facility] [address] [place] [address] Dept: [phone] Dept Fax: [phone]   No orders of the defined types were placed in this encounter.     CHIEF COMPLAINT:  CC: Stage IVB oropharynx cancer  Current Treatment: Adjuvant chemoradiation with weekly carboplatin/paclitaxel  HISTORY OF PRESENT ILLNESS:   Oncology History  Oropharyngeal carcinoma ([place])  11/06/2021 Initial Diagnosis   Oropharyngeal carcinoma ([place])   11/06/2021 Cancer Staging   Staging form: Pharynx - P16 Negative Oropharynx, AJCC 8th Edition - Clinical stage from 11/06/2021: Stage IVB (cT1, cN3, cM0, p16-) - Signed by [name], MD on 12/28/2021 Histopathologic type: Squamous cell carcinoma, NOS Stage prefix: Initial diagnosis Histologic grade (G): G2 Histologic grading system: 4 grade system Tumor size (mm): 13 Lymph-vascular invasion (LVI): LVI present/identified, NOS Diagnostic confirmation: Positive histology Specimen type: Excision Staged by: Managing physician Presence of extranodal extension: Present Extent of extranodal extension: Microscopic ENE(+) Distance of extension from the native lymph node capsule to the farthest point of invasion in the extranodal tissue (mm): 6.5 ECOG performance status: Grade 1 Perineural invasion (PNI): Absent Prognostic indicators: 1.3 cm  with 1/23 nodes positive -2.3 cm with ENE and focal LVI Stage used in treatment planning: Yes National guidelines used in treatment planning: Yes Type of national  guideline used in treatment planning: NCCN   01/06/2022 -  Chemotherapy   Patient is on Treatment Plan : HEAD/NECK Carboplatin + Paclitaxel + XRT q7d     01/19/2022 Imaging   CT neck:  IMPRESSION:  1. 26 x 11 x 11 mm soft tissue mass posterior to the left  submandibular gland and anterior to the SCM. Some residual soft  tissue is noted in the PET scan. While this may represent  postoperative scar tissue, it is concerning for residual or  recurrent tumor.  2. Asymmetric soft tissue at the left glossal tonsillar sulcus with  focal surgical clips. No definite mass lesion is present.  3. Left carotid stent is in place. Vessel appears patent.  4. Atherosclerotic calcifications at the right carotid bifurcation  with what appears to be a high-grade proximal right ICA stenosis.  5. Right IJ Port-A-Cath in place.        INTERVAL HISTORY:  Barbara is here today for repeat clinical assessment prior to his 6th cycle of carboplatin/paclitaxel.  He continues to report left facial swelling, as well as left jaw and neck pain.  Unfortunately, medications have been ineffective.  Previously, he felt the radiation may have helped, but he has persistent pain again.  He reports stable, chronic low back pain. He denies fevers or chills. His appetite is good. His weight has increased 4 pounds over last week .  REVIEW OF SYSTEMS:  Review of Systems  Constitutional:  Negative for appetite change, chills, fatigue, fever and unexpected weight change.  HENT:   Negative for lump/mass, mouth sores and sore throat.   Respiratory:  Negative for cough and shortness of breath.   Cardiovascular:  Negative for chest pain and leg swelling.  Gastrointestinal:  Negative for abdominal pain, constipation, diarrhea, nausea and vomiting.  Genitourinary:  Negative for difficulty urinating, dysuria, frequency and hematuria.   Musculoskeletal:  Positive for back pain. Negative for arthralgias and myalgias.  Skin:  Negative for  itching, rash and wound.  Neurological:  Negative for dizziness, extremity weakness, headaches, light-headedness and numbness.  Hematological:  Negative for adenopathy.  Psychiatric/Behavioral:  Negative for depression and sleep disturbance. The patient is not nervous/anxious.      VITALS:  Blood pressure (!) 164/77, pulse 85, temperature 98.6 F (37 C), temperature source Oral, resp. rate 20, height '5\' 9"'$  (1.753 m), weight 242 lb 8 oz (110 kg), SpO2 97 %.  Wt Readings from Last 3 Encounters:  02/16/22 242 lb 8 oz (110 kg)  02/09/22 238 lb 3.2 oz (108 kg)  02/04/22 240 lb (108.9 kg)    Body mass index is 35.81 kg/m.  Performance status (ECOG): 1 - Symptomatic but completely ambulatory  PHYSICAL EXAM:  Physical Exam Vitals and nursing note reviewed.  Constitutional:      General: He is not in acute distress.    Appearance: Normal appearance. He is normal weight.  HENT:     Head: Normocephalic and atraumatic.     Jaw: Tenderness present.     Comments: Persistent left facial swelling and with tenderness of the upper and lower jaw, including the postauricular area, without palpable mass    Mouth/Throat:     Mouth: Mucous membranes are moist. No oral lesions.     Pharynx: Oropharynx is clear. No oropharyngeal exudate or posterior oropharyngeal erythema.  Comments: No visual or palpable oral masses. Eyes:     General: No scleral icterus.    Extraocular Movements: Extraocular movements intact.     Conjunctiva/sclera: Conjunctivae normal.     Pupils: Pupils are equal, round, and reactive to light.  Cardiovascular:     Rate and Rhythm: Normal rate and regular rhythm.     Heart sounds: Normal heart sounds. No murmur heard.    No friction rub. No gallop.  Pulmonary:     Effort: Pulmonary effort is normal.     Breath sounds: Normal breath sounds. No wheezing, rhonchi or rales.  Musculoskeletal:        General: Normal range of motion.     Cervical back: Normal range of motion and  neck supple. No tenderness.     Right lower leg: No edema.     Left lower leg: No edema.  Lymphadenopathy:     Cervical: No cervical adenopathy.     Upper Body:     Right upper body: No supraclavicular or axillary adenopathy.     Left upper body: No supraclavicular or axillary adenopathy.  Skin:    General: Skin is warm and dry.     Coloration: Skin is not jaundiced.     Findings: No rash.  Neurological:     Mental Status: He is alert and oriented to person, place, and time.     Cranial Nerves: No cranial nerve deficit.  Psychiatric:        Mood and Affect: Mood normal.        Behavior: Behavior normal.        Thought Content: Thought content normal.     LABS:      Latest Ref Rng & Units 02/16/2022    8:36 AM 02/09/2022   10:15 AM 02/02/2022   12:00 AM  CBC  WBC 4.0 - 10.5 K/uL 3.2  3.6  4.6      Hemoglobin 13.0 - 17.0 g/dL 13.1  14.0  13.7      Hematocrit 39.0 - 52.0 % 38.6  42.0  39      Platelets 150 - 400 K/uL 196  185  136         This result is from an external source.      Latest Ref Rng & Units 02/16/2022    8:36 AM 02/09/2022   10:15 AM 02/02/2022   12:00 AM  CMP  Glucose 70 - 99 mg/dL 90  98    BUN 6 - 20 mg/dL '17  16  24      '$ Creatinine 0.61 - 1.24 mg/dL 0.69  0.68  0.6      Sodium 135 - 145 mmol/L 143  142  136      Potassium 3.5 - 5.1 mmol/L 4.1  4.1  3.8      Chloride 98 - 111 mmol/L 108  103  100      CO2 22 - 32 mmol/L '30  25  28      '$ Calcium 8.9 - 10.3 mg/dL 9.4  9.6  9.6      Total Protein 6.5 - 8.1 g/dL 6.9  6.7    Total Bilirubin 0.3 - 1.2 mg/dL 0.5  0.5    Alkaline Phos 38 - 126 U/L 64  54    AST 15 - 41 U/L 22  22    ALT 0 - 44 U/L 25  25       This result is from an external source.  No results found for: "CEA1", "CEA" / No results found for: "CEA1", "CEA" No results found for: "PSA1" No results found for: "KDT267" No results found for: "CAN125"  No results found for: "TOTALPROTELP", "ALBUMINELP", "A1GS", "A2GS", "BETS",  "BETA2SER", "GAMS", "MSPIKE", "SPEI" No results found for: "TIBC", "FERRITIN", "IRONPCTSAT" No results found for: "LDH"  STUDIES:  No results found.    HISTORY:   Past Medical History:  Diagnosis Date   Arthritis    Back pain    GERD (gastroesophageal reflux disease)    History of kidney stones    Hypertension    Sleep apnea    does not have cpap yet    Past Surgical History:  Procedure Laterality Date   APPENDECTOMY     HAND SURGERY     LUMBAR LAMINECTOMY/DECOMPRESSION MICRODISCECTOMY N/A 12/28/2017   Procedure: Laminectomy and Foraminotomy Lumbar two-three-Lumbar three-four - Lumbar four-five;  Surgeon: Kary Kos, MD;  Location: [facility];  Service: Neurosurgery;  Laterality: N/A;   OTHER SURGICAL HISTORY     removal on cancer from neck    Family History  Problem Relation Age of Onset   Thyroid disease Mother    Heart disease Father    Prostate cancer Brother    Healthy Daughter    Healthy Son     Social History:  reports that he has been smoking cigarettes. He has a 22.00 pack-year smoking history. He has never used smokeless tobacco. He reports that he does not currently use alcohol. He reports that he does not use drugs.The patient is accompanied by his wife today.  Allergies: No Known Allergies  Current Medications: Current Outpatient Medications  Medication Sig Dispense Refill   amitriptyline (ELAVIL) 75 MG tablet Take 1 tablet by mouth daily.     amLODipine (NORVASC) 10 MG tablet Take 1 tablet by mouth every morning.     aspirin EC 81 MG tablet Take by mouth.     atorvastatin (LIPITOR) 80 MG tablet Take 80 mg by mouth daily.     chlorhexidine (PERIDEX) 0.12 % solution SMARTSIG:1 Capful(s) By Mouth Twice Daily     cyanocobalamin (VITAMIN B12) 1000 MCG/ML injection Inject 1 mL every month by subcutaneous route for 1 day.     dexamethasone 0.5 MG/5ML elixir Take by mouth.     fenofibrate 54 MG tablet TAKE ONE TABLET BY MOUTH EVERY DAY FOR 30 DAYS      gabapentin (NEURONTIN) 800 MG tablet Take 800 mg by mouth 4 (four) times daily.     HYDROcodone-acetaminophen (NORCO) 10-325 MG tablet Take by mouth.     ibuprofen (ADVIL) 800 MG tablet Take by mouth.     ketorolac (TORADOL) 10 MG tablet Take 10 mg by mouth every 6 (six) hours as needed.     lidocaine (XYLOCAINE) 2 % solution SMARTSIG:15 Milliliter(s) By Mouth Every 3 Hours PRN     magic mouthwash SOLN Take 15 mLs by mouth in the morning, at noon, in the evening, and at bedtime.     nitroGLYCERIN (NITROSTAT) 0.4 MG SL tablet PLACE 1 TABLET UNDER THE TONGUE EVERY 5 MIN FOR CHEST PAIN AS NEEDED. NO MORE THAN 3 TABS IN 15 MIN.     nystatin (MYCOSTATIN) 100000 UNIT/ML suspension Swish and swallow 4 (four) times daily     ondansetron (ZOFRAN) 8 MG tablet Take 1 tablet (8 mg total) by mouth every 8 (eight) hours as needed for nausea or vomiting. Start on the third day after chemotherapy. 30 tablet 1   oxyCODONE-acetaminophen (PERCOCET/ROXICET) 5-325  MG tablet Take 1-2 tablets by mouth every 4 (four) hours as needed for severe pain. (Patient not taking: Reported on 01/18/2022) 15 tablet 0   pantoprazole (PROTONIX) 40 MG tablet Take 1 tablet by mouth daily.     phentermine 37.5 MG capsule Take by mouth. (Patient not taking: Reported on 01/12/2022)     predniSONE (DELTASONE) 20 MG tablet Take 20 mg by mouth 2 (two) times daily. (Patient not taking: Reported on 01/12/2022)     prochlorperazine (COMPAZINE) 10 MG tablet Take 1 tablet (10 mg total) by mouth every 6 (six) hours as needed for nausea or vomiting. 30 tablet 1   senna-docusate (SENOKOT-S) 8.6-50 MG tablet Take by mouth.     tiZANidine (ZANAFLEX) 4 MG tablet Take by mouth.     triamterene-hydrochlorothiazide (MAXZIDE-25) 37.5-25 MG tablet Take 1 tablet by mouth daily.     varenicline (CHANTIX) 0.5 MG tablet Take 0.5 mg by mouth 2 (two) times daily. (Patient not taking: Reported on 01/12/2022)     No current facility-administered medications for this  visit.

## 2022-02-17 ENCOUNTER — Telehealth: Payer: Self-pay

## 2022-02-17 MED FILL — Carboplatin IV Soln 450 MG/45ML: INTRAVENOUS | Qty: 30 | Status: AC

## 2022-02-17 MED FILL — Paclitaxel IV Conc 300 MG/50ML (6 MG/ML): INTRAVENOUS | Qty: 17 | Status: AC

## 2022-02-17 MED FILL — Dexamethasone Sodium Phosphate Inj 100 MG/10ML: INTRAMUSCULAR | Qty: 1 | Status: AC

## 2022-02-17 NOTE — Telephone Encounter (Signed)
Patient aware and voiced understanding

## 2022-02-17 NOTE — Telephone Encounter (Signed)
-----   Message from [name], PA-C sent at 02/17/2022  8:19 AM EST ----- Please let him know his labs are good for chemo this week. Thanks

## 2022-02-18 ENCOUNTER — Inpatient Hospital Stay: Payer: Medicaid Other | Admitting: Dietician

## 2022-02-18 ENCOUNTER — Inpatient Hospital Stay: Payer: Medicaid Other

## 2022-02-18 VITALS — BP 139/90 | HR 80 | Temp 98.2°F | Resp 20 | Wt 242.0 lb

## 2022-02-18 DIAGNOSIS — C109 Malignant neoplasm of oropharynx, unspecified: Secondary | ICD-10-CM

## 2022-02-18 DIAGNOSIS — Z5111 Encounter for antineoplastic chemotherapy: Secondary | ICD-10-CM | POA: Diagnosis not present

## 2022-02-18 MED ORDER — SODIUM CHLORIDE 0.9 % IV SOLN
300.0000 mg | Freq: Once | INTRAVENOUS | Status: AC
  Administered 2022-02-18: 300 mg via INTRAVENOUS
  Filled 2022-02-18: qty 30

## 2022-02-18 MED ORDER — SODIUM CHLORIDE 0.9% FLUSH
10.0000 mL | INTRAVENOUS | Status: DC | PRN
  Administered 2022-02-18: 10 mL

## 2022-02-18 MED ORDER — FAMOTIDINE IN NACL 20-0.9 MG/50ML-% IV SOLN
20.0000 mg | Freq: Once | INTRAVENOUS | Status: AC
  Administered 2022-02-18: 20 mg via INTRAVENOUS
  Filled 2022-02-18: qty 50

## 2022-02-18 MED ORDER — PALONOSETRON HCL INJECTION 0.25 MG/5ML
0.2500 mg | Freq: Once | INTRAVENOUS | Status: AC
  Administered 2022-02-18: 0.25 mg via INTRAVENOUS
  Filled 2022-02-18: qty 5

## 2022-02-18 MED ORDER — DIPHENHYDRAMINE HCL 50 MG/ML IJ SOLN
25.0000 mg | Freq: Once | INTRAMUSCULAR | Status: AC
  Administered 2022-02-18: 25 mg via INTRAVENOUS
  Filled 2022-02-18: qty 1

## 2022-02-18 MED ORDER — SODIUM CHLORIDE 0.9 % IV SOLN
45.0000 mg/m2 | Freq: Once | INTRAVENOUS | Status: AC
  Administered 2022-02-18: 102 mg via INTRAVENOUS
  Filled 2022-02-18: qty 17

## 2022-02-18 MED ORDER — HEPARIN SOD (PORK) LOCK FLUSH 100 UNIT/ML IV SOLN
500.0000 [IU] | Freq: Once | INTRAVENOUS | Status: AC | PRN
  Administered 2022-02-18: 500 [IU]

## 2022-02-18 MED ORDER — SODIUM CHLORIDE 0.9 % IV SOLN: Freq: Once | INTRAVENOUS | Status: AC

## 2022-02-18 MED ORDER — SODIUM CHLORIDE 0.9 % IV SOLN
10.0000 mg | Freq: Once | INTRAVENOUS | Status: AC
  Administered 2022-02-18: 10 mg via INTRAVENOUS
  Filled 2022-02-18: qty 10

## 2022-02-18 NOTE — Progress Notes (Signed)
Attempted to reach patient for scheduled remote follow up during his infusion.  Call would not go through on mobile. Sent text asking him to call.  I will attempt to reach later in day if I do not hear from him.  Tried again at 3:25 PM still could not reach. April Manson, RDN, LDN Registered Dietitian, [place] Part Time Remote (Usual office hours: Tuesday-Thursday) Mobile: [phone] Remote Office: [phone]

## 2022-02-21 NOTE — Progress Notes (Unsigned)
Ian Case  [address] [place],  [place]  [address] [phone]  Clinic Day:  02/21/2022  Referring physician: [name], MD  ASSESSMENT & PLAN:   Assessment & Plan: No problem-specific Assessment & Plan notes found for this encounter.    The patient understands the plans discussed today and is in agreement with them.  He knows to contact our office if he develops concerns prior to his next appointment.   I provided 10 minutes of face-to-face time during this encounter and > 50% was spent counseling as documented under my assessment and plan.    [name], MD  [facility] [address] [place] [address] Dept: [phone] Dept Fax: [phone]   No orders of the defined types were placed in this encounter.     CHIEF COMPLAINT:  CC: Stage IVB oropharynx cancer  Current Treatment: Adjuvant chemoradiation with weekly carboplatin/paclitaxel  HISTORY OF PRESENT ILLNESS:   Oncology History  Oropharyngeal carcinoma ([place])  11/06/2021 Initial Diagnosis   Oropharyngeal carcinoma ([place])   11/06/2021 Cancer Staging   Staging form: Pharynx - P16 Negative Oropharynx, AJCC 8th Edition - Clinical stage from 11/06/2021: Stage IVB (cT1, cN3, cM0, p16-) - Signed by [name], MD on 12/28/2021 Histopathologic type: Squamous cell carcinoma, NOS Stage prefix: Initial diagnosis Histologic grade (G): G2 Histologic grading system: 4 grade system Tumor size (mm): 13 Lymph-vascular invasion (LVI): LVI present/identified, NOS Diagnostic confirmation: Positive histology Specimen type: Excision Staged by: Managing physician Presence of extranodal extension: Present Extent of extranodal extension: Microscopic ENE(+) Distance of extension from the native lymph node capsule to the farthest point of invasion in the extranodal tissue (mm): 6.5 ECOG performance  status: Grade 1 Perineural invasion (PNI): Absent Prognostic indicators: 1.3 cm with 1/23 nodes positive -2.3 cm with ENE and focal LVI Stage used in treatment planning: Yes National guidelines used in treatment planning: Yes Type of national guideline used in treatment planning: NCCN   01/06/2022 -  Chemotherapy   Patient is on Treatment Plan : HEAD/NECK Carboplatin + Paclitaxel + XRT q7d     01/19/2022 Imaging   CT neck:  IMPRESSION:  1. 26 x 11 x 11 mm soft tissue mass posterior to the left  submandibular gland and anterior to the SCM. Some residual soft  tissue is noted in the PET scan. While this may represent  postoperative scar tissue, it is concerning for residual or  recurrent tumor.  2. Asymmetric soft tissue at the left glossal tonsillar sulcus with  focal surgical clips. No definite mass lesion is present.  3. Left carotid stent is in place. Vessel appears patent.  4. Atherosclerotic calcifications at the right carotid bifurcation  with what appears to be a high-grade proximal right ICA stenosis.  5. Right IJ Port-A-Cath in place.        INTERVAL HISTORY:  Ian Case is here today for repeat clinical assessment prior to his 6th cycle of carboplatin/paclitaxel.  He continues to report left facial swelling, as well as left jaw and neck pain.  Unfortunately, medications have been ineffective.  Previously, he felt the radiation may have helped, but he has persistent pain again.  He reports stable, chronic low back pain. He denies fevers or chills. His appetite is good. His weight has increased 4 pounds over last week .  REVIEW OF SYSTEMS:  Review of Systems  Constitutional:  Negative for appetite change, chills, fatigue, fever and  unexpected weight change.  HENT:   Negative for lump/mass, mouth sores and sore throat.   Respiratory:  Negative for cough and shortness of breath.   Cardiovascular:  Negative for chest pain and leg swelling.  Gastrointestinal:  Negative for  abdominal pain, constipation, diarrhea, nausea and vomiting.  Genitourinary:  Negative for difficulty urinating, dysuria, frequency and hematuria.   Musculoskeletal:  Positive for back pain. Negative for arthralgias and myalgias.  Skin:  Negative for itching, rash and wound.  Neurological:  Negative for dizziness, extremity weakness, headaches, light-headedness and numbness.  Hematological:  Negative for adenopathy.  Psychiatric/Behavioral:  Negative for depression and sleep disturbance. The patient is not nervous/anxious.      VITALS:  There were no vitals taken for this visit.  Wt Readings from Last 3 Encounters:  02/18/22 242 lb (109.8 kg)  02/16/22 242 lb 8 oz (110 kg)  02/09/22 238 lb 3.2 oz (108 kg)    There is no height or weight on file to calculate BMI.  Performance status (ECOG): 1 - Symptomatic but completely ambulatory  PHYSICAL EXAM:  Physical Exam Vitals and nursing note reviewed.  Constitutional:      General: He is not in acute distress.    Appearance: Normal appearance. He is normal weight.  HENT:     Head: Normocephalic and atraumatic.     Jaw: Tenderness present.     Comments: Persistent left facial swelling and with tenderness of the upper and lower jaw, including the postauricular area, without palpable mass    Mouth/Throat:     Mouth: Mucous membranes are moist. No oral lesions.     Pharynx: Oropharynx is clear. No oropharyngeal exudate or posterior oropharyngeal erythema.     Comments: No visual or palpable oral masses. Eyes:     General: No scleral icterus.    Extraocular Movements: Extraocular movements intact.     Conjunctiva/sclera: Conjunctivae normal.     Pupils: Pupils are equal, round, and reactive to light.  Cardiovascular:     Rate and Rhythm: Normal rate and regular rhythm.     Heart sounds: Normal heart sounds. No murmur heard.    No friction rub. No gallop.  Pulmonary:     Effort: Pulmonary effort is normal.     Breath sounds: Normal  breath sounds. No wheezing, rhonchi or rales.  Musculoskeletal:        General: Normal range of motion.     Cervical back: Normal range of motion and neck supple. No tenderness.     Right lower leg: No edema.     Left lower leg: No edema.  Lymphadenopathy:     Cervical: No cervical adenopathy.     Upper Body:     Right upper body: No supraclavicular or axillary adenopathy.     Left upper body: No supraclavicular or axillary adenopathy.  Skin:    General: Skin is warm and dry.     Coloration: Skin is not jaundiced.     Findings: No rash.  Neurological:     Mental Status: He is alert and oriented to person, place, and time.     Cranial Nerves: No cranial nerve deficit.  Psychiatric:        Mood and Affect: Mood normal.        Behavior: Behavior normal.        Thought Content: Thought content normal.     LABS:      Latest Ref Rng & Units 02/16/2022    8:36 AM 02/09/2022  10:15 AM 02/02/2022   12:00 AM  CBC  WBC 4.0 - 10.5 K/uL 3.2  3.6  4.6      Hemoglobin 13.0 - 17.0 g/dL 13.1  14.0  13.7      Hematocrit 39.0 - 52.0 % 38.6  42.0  39      Platelets 150 - 400 K/uL 196  185  136         This result is from an external source.       Latest Ref Rng & Units 02/16/2022    8:36 AM 02/09/2022   10:15 AM 02/02/2022   12:00 AM  CMP  Glucose 70 - 99 mg/dL 90  98    BUN 6 - 20 mg/dL '17  16  24      '$ Creatinine 0.61 - 1.24 mg/dL 0.69  0.68  0.6      Sodium 135 - 145 mmol/L 143  142  136      Potassium 3.5 - 5.1 mmol/L 4.1  4.1  3.8      Chloride 98 - 111 mmol/L 108  103  100      CO2 22 - 32 mmol/L '30  25  28      '$ Calcium 8.9 - 10.3 mg/dL 9.4  9.6  9.6      Total Protein 6.5 - 8.1 g/dL 6.9  6.7    Total Bilirubin 0.3 - 1.2 mg/dL 0.5  0.5    Alkaline Phos 38 - 126 U/L 64  54    AST 15 - 41 U/L 22  22    ALT 0 - 44 U/L 25  25       This result is from an external source.      No results found for: "CEA1", "CEA" / No results found for: "CEA1", "CEA" No results found for:  "PSA1" No results found for: "ZJI967" No results found for: "CAN125"  No results found for: "TOTALPROTELP", "ALBUMINELP", "A1GS", "A2GS", "BETS", "BETA2SER", "GAMS", "MSPIKE", "SPEI" No results found for: "TIBC", "FERRITIN", "IRONPCTSAT" No results found for: "LDH"  STUDIES:  No results found.    HISTORY:   Past Medical History:  Diagnosis Date   Arthritis    Back pain    GERD (gastroesophageal reflux disease)    History of kidney stones    Hypertension    Sleep apnea    does not have cpap yet    Past Surgical History:  Procedure Laterality Date   APPENDECTOMY     HAND SURGERY     LUMBAR LAMINECTOMY/DECOMPRESSION MICRODISCECTOMY N/A 12/28/2017   Procedure: Laminectomy and Foraminotomy Lumbar two-three-Lumbar three-four - Lumbar four-five;  Surgeon: Kary Kos, MD;  Location: [facility];  Service: Neurosurgery;  Laterality: N/A;   OTHER SURGICAL HISTORY     removal on cancer from neck    Family History  Problem Relation Age of Onset   Thyroid disease Mother    Heart disease Father    Prostate cancer Brother    Healthy Daughter    Healthy Son     Social History:  reports that he has been smoking cigarettes. He has a 22.00 pack-year smoking history. He has never used smokeless tobacco. He reports that he does not currently use alcohol. He reports that he does not use drugs.The patient is accompanied by his wife today.  Allergies: No Known Allergies  Current Medications: Current Outpatient Medications  Medication Sig Dispense Refill   amitriptyline (ELAVIL) 75 MG tablet Take 1 tablet by mouth daily.  amLODipine (NORVASC) 10 MG tablet Take 1 tablet by mouth every morning.     aspirin EC 81 MG tablet Take by mouth.     atorvastatin (LIPITOR) 80 MG tablet Take 80 mg by mouth daily.     chlorhexidine (PERIDEX) 0.12 % solution SMARTSIG:1 Capful(s) By Mouth Twice Daily     cyanocobalamin (VITAMIN B12) 1000 MCG/ML injection Inject 1 mL every month by subcutaneous route for 1  day.     dexamethasone 0.5 MG/5ML elixir Take by mouth.     fenofibrate 54 MG tablet TAKE ONE TABLET BY MOUTH EVERY DAY FOR 30 DAYS     gabapentin (NEURONTIN) 800 MG tablet Take 800 mg by mouth 4 (four) times daily.     HYDROcodone-acetaminophen (NORCO) 10-325 MG tablet Take by mouth.     ibuprofen (ADVIL) 800 MG tablet Take by mouth.     ketorolac (TORADOL) 10 MG tablet Take 10 mg by mouth every 6 (six) hours as needed.     lidocaine (XYLOCAINE) 2 % solution SMARTSIG:15 Milliliter(s) By Mouth Every 3 Hours PRN     magic mouthwash SOLN Take 15 mLs by mouth in the morning, at noon, in the evening, and at bedtime.     nitroGLYCERIN (NITROSTAT) 0.4 MG SL tablet PLACE 1 TABLET UNDER THE TONGUE EVERY 5 MIN FOR CHEST PAIN AS NEEDED. NO MORE THAN 3 TABS IN 15 MIN.     nystatin (MYCOSTATIN) 100000 UNIT/ML suspension Swish and swallow 4 (four) times daily     ondansetron (ZOFRAN) 8 MG tablet Take 1 tablet (8 mg total) by mouth every 8 (eight) hours as needed for nausea or vomiting. Start on the third day after chemotherapy. 30 tablet 1   oxyCODONE-acetaminophen (PERCOCET/ROXICET) 5-325 MG tablet Take 1-2 tablets by mouth every 4 (four) hours as needed for severe pain. (Patient not taking: Reported on 01/18/2022) 15 tablet 0   pantoprazole (PROTONIX) 40 MG tablet Take 1 tablet by mouth daily.     phentermine 37.5 MG capsule Take by mouth. (Patient not taking: Reported on 01/12/2022)     predniSONE (DELTASONE) 20 MG tablet Take 20 mg by mouth 2 (two) times daily. (Patient not taking: Reported on 01/12/2022)     prochlorperazine (COMPAZINE) 10 MG tablet Take 1 tablet (10 mg total) by mouth every 6 (six) hours as needed for nausea or vomiting. 30 tablet 1   senna-docusate (SENOKOT-S) 8.6-50 MG tablet Take by mouth.     tiZANidine (ZANAFLEX) 4 MG tablet Take by mouth.     triamterene-hydrochlorothiazide (MAXZIDE-25) 37.5-25 MG tablet Take 1 tablet by mouth daily.     varenicline (CHANTIX) 0.5 MG tablet Take  0.5 mg by mouth 2 (two) times daily. (Patient not taking: Reported on 01/12/2022)     No current facility-administered medications for this visit.

## 2022-02-22 ENCOUNTER — Inpatient Hospital Stay (INDEPENDENT_AMBULATORY_CARE_PROVIDER_SITE_OTHER): Payer: Medicaid Other | Admitting: Oncology

## 2022-02-22 ENCOUNTER — Encounter: Payer: Self-pay | Admitting: Oncology

## 2022-02-22 ENCOUNTER — Inpatient Hospital Stay: Payer: Medicaid Other

## 2022-02-22 ENCOUNTER — Other Ambulatory Visit: Payer: Self-pay | Admitting: Oncology

## 2022-02-22 VITALS — BP 136/74 | HR 73 | Temp 97.8°F | Resp 18 | Ht 69.0 in | Wt 245.6 lb

## 2022-02-22 DIAGNOSIS — C109 Malignant neoplasm of oropharynx, unspecified: Secondary | ICD-10-CM | POA: Diagnosis not present

## 2022-02-22 DIAGNOSIS — Z5111 Encounter for antineoplastic chemotherapy: Secondary | ICD-10-CM | POA: Diagnosis not present

## 2022-02-22 LAB — CMP (CANCER CENTER ONLY)
ALT: 18 U/L (ref 0–44)
AST: 16 U/L (ref 15–41)
Albumin: 3.8 g/dL (ref 3.5–5.0)
Alkaline Phosphatase: 57 U/L (ref 38–126)
Anion gap: 8 (ref 5–15)
BUN: 25 mg/dL — ABNORMAL HIGH (ref 6–20)
CO2: 31 mmol/L (ref 22–32)
Calcium: 9 mg/dL (ref 8.9–10.3)
Chloride: 99 mmol/L (ref 98–111)
Creatinine: 0.76 mg/dL (ref 0.61–1.24)
GFR, Estimated: >60 mL/min (ref >60)
Glucose, Bld: 78 mg/dL (ref 70–99)
Potassium: 3.5 mmol/L (ref 3.5–5.1)
Sodium: 138 mmol/L (ref 135–145)
Total Bilirubin: 0.4 mg/dL (ref 0.3–1.2)
Total Protein: 6.6 g/dL (ref 6.5–8.1)

## 2022-02-22 NOTE — Progress Notes (Signed)
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 This is p16 negative. He has several concerning features on his pathology, including 1 positive node with extranodal extension and focal lymphovascular invasion. He was healing well, and then developed left facial swelling. CT scan was nonspecific. His ENT surgeon recommended that we proceed with his postoperative treatment of adjuvant concurrent chemoradiation.  Painful left facial swelling  We can see soft tissue swelling on the CT scan and thought that this represented parotiditis. We had him initially on 1 antibiotic then changed to Clindamycin but neither helped. We asked his ENT surgeon for an opinion and he did not seem to know what this was either. He recommended we proceed with treatment and so we have. This does not seem to be spread of his throat cancer but did not respond to antibiotics and is very erythematous and tender. I recommended a referral to a tertiary care center ENT and we will get an MRI of the area.  Chronic Back Pain Spinal stenosis with 4 back surgeries including vertebral fusion. Followed by [facility] with plans for L-spine MRI next week.   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 He had carotid angioplasty on the left and may eventually require it on the right.   Tinnitus/Hearing Loss We may want to avoid cisplatin as it may worsen this. I previously referred him to an audiologist.   Tobacco Abuse He is motivated to quit and is currently on Chantix and is down to one half pack per day.      Plan: We will continue with 6th cycle of adjuvant chemoradiation with carboplatin/paclitaxel at this time. He is unhappy  with his persistent left-sided facial swelling and pain as he would like to know what is causing it. He states he wants to stop therapy as he can't see any improvement.  We discussed his inconclusive CT soft tissue neck results from 02/03/22. He is agreeable to ENT referral and MRI soft tissue neck for further evaluation of this problem. He states that he may want to stop treatment if his facial pain and swelling continue to worsen. I discussed the assessment and treatment plan with the patient.  The patient was provided an opportunity to ask questions and all were answered.  The patient agreed with the plan and demonstrated an understanding of the instructions.  The patient was advised to call back if the symptoms worsen or if the condition fails to improve as anticipated.   I provided 40 minutes of face-to-face time during this encounter and > 50% was spent counseling as documented under my assessment and plan.    [facility] [address] [place] [address] Dept: [phone] Dept Fax: [phone]   No orders of the defined types were placed in this encounter.   CHIEF COMPLAINT:  CC: Stage IVB oropharynx cancer  Current Treatment: Adjuvant chemoradiation with weekly carboplatin/paclitaxel  HISTORY OF PRESENT ILLNESS:   Oncology History  Oropharyngeal carcinoma ([place])  11/06/2021 Initial Diagnosis   Oropharyngeal carcinoma ([place])   11/06/2021 Cancer Staging   Staging form: Pharynx - P16 Negative Oropharynx, AJCC 8th Edition - Clinical stage from 11/06/2021: Stage IVB (  cT1, cN3, cM0, p16-) - Signed by [name], MD on 12/28/2021 Histopathologic type: Squamous cell carcinoma, NOS Stage prefix: Initial diagnosis Histologic grade (G): G2 Histologic grading system: 4 grade system Tumor size (mm): 13 Lymph-vascular invasion (LVI): LVI present/identified, NOS Diagnostic confirmation: Positive  histology Specimen type: Excision Staged by: Managing physician Presence of extranodal extension: Present Extent of extranodal extension: Microscopic ENE(+) Distance of extension from the native lymph node capsule to the farthest point of invasion in the extranodal tissue (mm): 6.5 ECOG performance status: Grade 1 Perineural invasion (PNI): Absent Prognostic indicators: 1.3 cm with 1/23 nodes positive -2.3 cm with ENE and focal LVI Stage used in treatment planning: Yes National guidelines used in treatment planning: Yes Type of national guideline used in treatment planning: NCCN   01/06/2022 -  Chemotherapy   Patient is on Treatment Plan : HEAD/NECK Carboplatin + Paclitaxel + XRT q7d     01/19/2022 Imaging   CT neck:  IMPRESSION:  1. 26 x 11 x 11 mm soft tissue mass posterior to the left  submandibular gland and anterior to the SCM. Some residual soft  tissue is noted in the PET scan. While this may represent  postoperative scar tissue, it is concerning for residual or  recurrent tumor.  2. Asymmetric soft tissue at the left glossal tonsillar sulcus with  focal surgical clips. No definite mass lesion is present.  3. Left carotid stent is in place. Vessel appears patent.  4. Atherosclerotic calcifications at the right carotid bifurcation  with what appears to be a high-grade proximal right ICA stenosis.  5. Right IJ Port-A-Cath in place.        INTERVAL HISTORY:  Ian Case is here today for repeat clinical assessment for Stage IVB oropharynx cancer prior to his 6th cycle of carboplatin/paclitaxel. He states that he did miss treatment in the past so our records say that this is his 7th treatment. He reports persistent left jaw, neck, and dental pain as well as left facial swelling. He notes that he hears popping with neck ROM intermittently. He feels that the symptoms worsen after his treatments but wax and wanes between treatments. Unfortunately, antibiotics have been ineffective. He  reminds [place] that his symptoms began after surgery in August 2023, but he felt the radiation may have helped slightly afterward. He has been able to tolerate PO intake and has no difficulty with swallowing for the most part. We discussed his CT neck soft tissue results which did not reveal any obvious explanation of the facial swelling. I offered him a referral for ENT surg for further consultation. He reports stable, chronic low back pain. He is scheduled for a MRI of his back next week with [facility]. He reports diffuse right-sided body pain for the last year or so since his stroke. He states that he has told his [facility] providers about this and they did not seem concerned. He denies fevers or chills. His appetite is good. His weight has increased 3 pounds over last week  REVIEW OF SYSTEMS:  Review of Systems  Constitutional:  Negative for appetite change, chills, fever and unexpected weight change.  HENT:   Negative for lump/mass, mouth sores and sore throat.        + left ear pain intermittently + left jaw pain + left facial swelling  Eyes: Negative.   Respiratory: Negative.  Negative for chest tightness, cough, hemoptysis, shortness of breath and wheezing.   Cardiovascular: Negative.  Negative for chest pain, leg swelling and palpitations.  Gastrointestinal: Negative.  Negative for abdominal distention, abdominal pain, blood in stool, constipation, diarrhea, nausea and vomiting.  Endocrine: Negative.  Negative for hot flashes.  Genitourinary: Negative.  Negative for difficulty urinating, dysuria, frequency and hematuria.   Musculoskeletal:  Positive for arthralgias and myalgias. Negative for back pain, flank pain and gait problem.  Skin: Negative.   Neurological: Negative.  Negative for dizziness, extremity weakness, gait problem, headaches, light-headedness, numbness, seizures and speech difficulty.  Hematological: Negative.  Negative for adenopathy. Does not bruise/bleed easily.   Psychiatric/Behavioral: Negative.  Negative for depression and sleep disturbance. The patient is not nervous/anxious.      VITALS:  Blood pressure 136/74, pulse 73, temperature 97.8 F (36.6 C), temperature source Oral, resp. rate 18, height '5\' 9"'$  (1.753 m), weight 245 lb 9.6 oz (111.4 kg), SpO2 97 %.  Wt Readings from Last 3 Encounters:  02/22/22 245 lb 9.6 oz (111.4 kg)  02/18/22 242 lb (109.8 kg)  02/16/22 242 lb 8 oz (110 kg)    Body mass index is 36.27 kg/m.  Performance status (ECOG): 1 - Symptomatic but completely ambulatory  PHYSICAL EXAM:  Physical Exam Vitals and nursing note reviewed.  Constitutional:      General: He is not in acute distress.    Appearance: Normal appearance. He is normal weight. He is not ill-appearing or toxic-appearing.  HENT:     Head: Normocephalic and atraumatic.     Comments: Large scar left neck. Swelling and erythema of left face with severe TTP. Mild edema of bilateral neck areas but these are not tender or erythematous.    Mouth/Throat:     Mouth: Mucous membranes are moist.     Pharynx: Oropharynx is clear. No oropharyngeal exudate or posterior oropharyngeal erythema.  Eyes:     General: No scleral icterus.    Extraocular Movements: Extraocular movements intact.     Conjunctiva/sclera: Conjunctivae normal.     Pupils: Pupils are equal, round, and reactive to light.  Cardiovascular:     Rate and Rhythm: Normal rate and regular rhythm.     Pulses: Normal pulses.     Heart sounds: Normal heart sounds. No murmur heard.    No friction rub. No gallop.  Pulmonary:     Effort: Pulmonary effort is normal. No respiratory distress.     Breath sounds: Normal breath sounds. No wheezing, rhonchi or rales.  Abdominal:     General: Bowel sounds are normal. There is no distension.     Palpations: Abdomen is soft. There is no hepatomegaly, splenomegaly or mass.     Tenderness: There is no abdominal tenderness.  Musculoskeletal:        General:  No swelling or tenderness. Normal range of motion.     Cervical back: Normal range of motion and neck supple. No tenderness.     Right lower leg: No edema.     Left lower leg: No edema.  Lymphadenopathy:     Cervical: No cervical adenopathy.     Upper Body:     Right upper body: No supraclavicular or axillary adenopathy.     Left upper body: No supraclavicular or axillary adenopathy.     Lower Body: No right inguinal adenopathy. No left inguinal adenopathy.  Skin:    General: Skin is warm and dry.     Coloration: Skin is not jaundiced.     Findings: No rash.  Neurological:     Mental Status: He is alert and oriented to person, place, and time.  Cranial Nerves: No cranial nerve deficit.  Psychiatric:        Mood and Affect: Mood normal.        Behavior: Behavior normal.        Thought Content: Thought content normal.     LABS:      Latest Ref Rng & Units 02/16/2022    8:36 AM 02/09/2022   10:15 AM 02/02/2022   12:00 AM  CBC  WBC 4.0 - 10.5 K/uL 3.2  3.6  4.6      Hemoglobin 13.0 - 17.0 g/dL 13.1  14.0  13.7      Hematocrit 39.0 - 52.0 % 38.6  42.0  39      Platelets 150 - 400 K/uL 196  185  136         This result is from an external source.      Latest Ref Rng & Units 02/16/2022    8:36 AM 02/09/2022   10:15 AM 02/02/2022   12:00 AM  CMP  Glucose 70 - 99 mg/dL 90  98    BUN 6 - 20 mg/dL '17  16  24      '$ Creatinine 0.61 - 1.24 mg/dL 0.69  0.68  0.6      Sodium 135 - 145 mmol/L 143  142  136      Potassium 3.5 - 5.1 mmol/L 4.1  4.1  3.8      Chloride 98 - 111 mmol/L 108  103  100      CO2 22 - 32 mmol/L '30  25  28      '$ Calcium 8.9 - 10.3 mg/dL 9.4  9.6  9.6      Total Protein 6.5 - 8.1 g/dL 6.9  6.7    Total Bilirubin 0.3 - 1.2 mg/dL 0.5  0.5    Alkaline Phos 38 - 126 U/L 64  54    AST 15 - 41 U/L 22  22    ALT 0 - 44 U/L 25  25       This result is from an external source.     No results found for: "CEA1", "CEA" / No results found for: "CEA1", "CEA" No  results found for: "PSA1" No results found for: "ZLD357" No results found for: "CAN125"  No results found for: "TOTALPROTELP", "ALBUMINELP", "A1GS", "A2GS", "BETS", "BETA2SER", "GAMS", "MSPIKE", "SPEI" No results found for: "TIBC", "FERRITIN", "IRONPCTSAT" No results found for: "LDH"  STUDIES:  No results found.   CT soft tissue neck 02/03/2022    NM PET skull base to mid thigh 01/13/2022     Chest Xray 1 view 12/22/2021    HISTORY:   Past Medical History:  Diagnosis Date   Arthritis    Back pain    GERD (gastroesophageal reflux disease)    History of kidney stones    Hypertension    Sleep apnea    does not have cpap yet    Past Surgical History:  Procedure Laterality Date   APPENDECTOMY     HAND SURGERY     LUMBAR LAMINECTOMY/DECOMPRESSION MICRODISCECTOMY N/A 12/28/2017   Procedure: Laminectomy and Foraminotomy Lumbar two-three-Lumbar three-four - Lumbar four-five;  Surgeon: Kary Kos, MD;  Location: [facility];  Service: Neurosurgery;  Laterality: N/A;   OTHER SURGICAL HISTORY     removal on cancer from neck    Family History  Problem Relation Age of Onset   Thyroid disease Mother    Heart disease Father    Prostate cancer Brother  Healthy Daughter    Healthy Son     Social History:  reports that he has been smoking cigarettes. He has a 22.00 pack-year smoking history. He has never used smokeless tobacco. He reports that he does not currently use alcohol. He reports that he does not use drugs.The patient is accompanied by his wife today.  Allergies: No Known Allergies  Current Medications: Current Outpatient Medications  Medication Sig Dispense Refill   amitriptyline (ELAVIL) 75 MG tablet Take 1 tablet by mouth daily.     amLODipine (NORVASC) 10 MG tablet Take 1 tablet by mouth every morning.     aspirin EC 81 MG tablet Take by mouth.     atorvastatin (LIPITOR) 80 MG tablet Take 80 mg by mouth daily.     chlorhexidine (PERIDEX) 0.12 % solution  SMARTSIG:1 Capful(s) By Mouth Twice Daily     cyanocobalamin (VITAMIN B12) 1000 MCG/ML injection Inject 1 mL every month by subcutaneous route for 1 day.     dexamethasone 0.5 MG/5ML elixir Take by mouth.     fenofibrate 54 MG tablet TAKE ONE TABLET BY MOUTH EVERY DAY FOR 30 DAYS     gabapentin (NEURONTIN) 800 MG tablet Take 800 mg by mouth 4 (four) times daily.     HYDROcodone-acetaminophen (NORCO) 10-325 MG tablet Take by mouth.     ibuprofen (ADVIL) 800 MG tablet Take by mouth.     ketorolac (TORADOL) 10 MG tablet Take 10 mg by mouth every 6 (six) hours as needed.     lidocaine (XYLOCAINE) 2 % solution SMARTSIG:15 Milliliter(s) By Mouth Every 3 Hours PRN     magic mouthwash SOLN Take 15 mLs by mouth in the morning, at noon, in the evening, and at bedtime.     nitroGLYCERIN (NITROSTAT) 0.4 MG SL tablet PLACE 1 TABLET UNDER THE TONGUE EVERY 5 MIN FOR CHEST PAIN AS NEEDED. NO MORE THAN 3 TABS IN 15 MIN.     nystatin (MYCOSTATIN) 100000 UNIT/ML suspension Swish and swallow 4 (four) times daily     ondansetron (ZOFRAN) 8 MG tablet Take 1 tablet (8 mg total) by mouth every 8 (eight) hours as needed for nausea or vomiting. Start on the third day after chemotherapy. 30 tablet 1   oxyCODONE-acetaminophen (PERCOCET/ROXICET) 5-325 MG tablet Take 1-2 tablets by mouth every 4 (four) hours as needed for severe pain. (Patient not taking: Reported on 01/18/2022) 15 tablet 0   pantoprazole (PROTONIX) 40 MG tablet Take 1 tablet by mouth daily.     phentermine 37.5 MG capsule Take by mouth. (Patient not taking: Reported on 01/12/2022)     predniSONE (DELTASONE) 20 MG tablet Take 20 mg by mouth 2 (two) times daily. (Patient not taking: Reported on 01/12/2022)     prochlorperazine (COMPAZINE) 10 MG tablet Take 1 tablet (10 mg total) by mouth every 6 (six) hours as needed for nausea or vomiting. 30 tablet 1   senna-docusate (SENOKOT-S) 8.6-50 MG tablet Take by mouth.     tiZANidine (ZANAFLEX) 4 MG tablet Take by  mouth.     triamterene-hydrochlorothiazide (MAXZIDE-25) 37.5-25 MG tablet Take 1 tablet by mouth daily.     varenicline (CHANTIX) 0.5 MG tablet Take 0.5 mg by mouth 2 (two) times daily. (Patient not taking: Reported on 01/12/2022)     No current facility-administered medications for this visit.     I,Alexis Herring,acting as a scribe for [name], MD.,have documented all relevant documentation on the behalf of [name], MD,as directed by  Altha Harm  Kathrin Ruddy, MD while in the presence of [name], MD.  I have reviewed this report as typed by the medical scribe, and it is complete and accurate.  Eugene Gavia   02/22/22 4:28 PM

## 2022-02-23 ENCOUNTER — Other Ambulatory Visit: Payer: Self-pay

## 2022-02-23 ENCOUNTER — Telehealth: Payer: Self-pay

## 2022-02-23 DIAGNOSIS — C109 Malignant neoplasm of oropharynx, unspecified: Secondary | ICD-10-CM

## 2022-02-23 MED FILL — Carboplatin IV Soln 450 MG/45ML: INTRAVENOUS | Qty: 30 | Status: AC

## 2022-02-23 MED FILL — Paclitaxel IV Conc 300 MG/50ML (6 MG/ML): INTRAVENOUS | Qty: 17 | Status: AC

## 2022-02-23 MED FILL — Dexamethasone Sodium Phosphate Inj 100 MG/10ML: INTRAMUSCULAR | Qty: 1 | Status: AC

## 2022-02-23 NOTE — Telephone Encounter (Signed)
Sent referral electronically.

## 2022-02-23 NOTE — Telephone Encounter (Signed)
-----   Message from [name], MD sent at 02/22/2022  3:50 PM EST ----- Regarding: ref Pls refer [facility] ENT regarding left facial swelling in pt going thru treatment for oropharyngeal cancer This occurred even before we started chemoradiation & did not respond to ab's, painful. Will get MRI

## 2022-02-24 ENCOUNTER — Other Ambulatory Visit: Payer: Self-pay

## 2022-02-24 ENCOUNTER — Inpatient Hospital Stay: Payer: Medicaid Other

## 2022-02-24 ENCOUNTER — Other Ambulatory Visit: Payer: Self-pay | Admitting: Oncology

## 2022-02-24 DIAGNOSIS — K1120 Sialoadenitis, unspecified: Secondary | ICD-10-CM

## 2022-02-24 DIAGNOSIS — C109 Malignant neoplasm of oropharynx, unspecified: Secondary | ICD-10-CM

## 2022-03-01 ENCOUNTER — Other Ambulatory Visit: Payer: Self-pay | Admitting: Oncology

## 2022-03-01 DIAGNOSIS — C109 Malignant neoplasm of oropharynx, unspecified: Secondary | ICD-10-CM

## 2022-03-02 ENCOUNTER — Ambulatory Visit (HOSPITAL_COMMUNITY)
Admission: RE | Admit: 2022-03-02 | Discharge: 2022-03-02 | Disposition: A | Discharge status: Home / Self Care | Payer: Medicaid Other | Source: Ambulatory Visit | Attending: Oncology | Admitting: Oncology

## 2022-03-02 ENCOUNTER — Telehealth: Payer: Self-pay | Admitting: Oncology

## 2022-03-02 ENCOUNTER — Encounter (HOSPITAL_COMMUNITY): Payer: Self-pay | Admitting: Radiology

## 2022-03-02 DIAGNOSIS — C109 Malignant neoplasm of oropharynx, unspecified: Secondary | ICD-10-CM | POA: Insufficient documentation

## 2022-03-02 DIAGNOSIS — K1120 Sialoadenitis, unspecified: Secondary | ICD-10-CM | POA: Diagnosis present

## 2022-03-02 MED ORDER — GADOBUTROL 1 MMOL/ML IV SOLN
10.0000 mL | Freq: Once | INTRAVENOUS | Status: AC | PRN
  Administered 2022-03-02: 10 mL via INTRAVENOUS

## 2022-03-03 ENCOUNTER — Other Ambulatory Visit: Payer: Self-pay

## 2022-03-03 DIAGNOSIS — C109 Malignant neoplasm of oropharynx, unspecified: Secondary | ICD-10-CM

## 2022-03-04 ENCOUNTER — Telehealth: Payer: Self-pay

## 2022-03-04 ENCOUNTER — Other Ambulatory Visit: Payer: Self-pay | Admitting: Oncology

## 2022-03-04 ENCOUNTER — Inpatient Hospital Stay (INDEPENDENT_AMBULATORY_CARE_PROVIDER_SITE_OTHER): Payer: Medicaid Other | Admitting: Oncology

## 2022-03-04 ENCOUNTER — Encounter: Payer: Self-pay | Admitting: Oncology

## 2022-03-04 ENCOUNTER — Other Ambulatory Visit: Payer: Self-pay

## 2022-03-04 VITALS — BP 142/81 | HR 79 | Temp 97.7°F | Resp 18 | Ht 69.0 in | Wt 246.9 lb

## 2022-03-04 DIAGNOSIS — C109 Malignant neoplasm of oropharynx, unspecified: Secondary | ICD-10-CM | POA: Diagnosis not present

## 2022-03-04 DIAGNOSIS — K1120 Sialoadenitis, unspecified: Secondary | ICD-10-CM

## 2022-03-04 MED ORDER — PREDNISONE 10 MG PO TABS: 40.0000 mg | ORAL_TABLET | Freq: Every day | ORAL | 0 refills | Status: DC

## 2022-03-04 NOTE — Progress Notes (Signed)
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 This is p16 negative. He has several concerning features on his pathology, including 1 positive node with extranodal extension and focal lymphovascular invasion. He was healing well, and then developed left facial pain and swelling, this occurred prior to initiating any chemoradiation. CT scan was nonspecific. His ENT surgeon recommended that we proceed with his postoperative treatment of adjuvant concurrent chemoradiation.  Painful left facial swelling  We can see soft tissue swelling on the CT scan and thought that this represented parotiditis. We had him initially on 1 antibiotic then changed to Clindamycin but neither helped. We asked his ENT surgeon for an opinion and he did not seem to know what this was either. He recommended we proceed with treatment and so we have. This does not seem to be spread of his throat cancer but did not respond to antibiotics and is very erythematous and tender and fluctuates up and down. I recommended a referral to a tertiary care center ENT and we will get an MRI.  Chronic Back Pain Spinal stenosis with 4 back surgeries including vertebral fusion. Followed by [facility] with plans for L-spine MRI next week.   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 He had carotid angioplasty on the left and may eventually require it on the right. We do see some chronic infarctions in the pons and basal ganglia.   Tinnitus/Hearing Loss We may want to avoid cisplatin as it may worsen this. I previously referred him to an audiologist.   Tobacco Abuse He is motivated to quit and is currently on Chantix and is down to one half pack per  day.      Plan: He is frustrated with his persistent left-sided facial swelling and pain as he would like to know what is causing it. He has stopped chemotherapy as he can't see any improvement with only 5 cycles. I have tried to be clear that this is two separate issues and the left facial pain and swelling started well before the start of chemoradiation. We had no expectation that it would improve with his treatment but reviewed the indications for his chemoradiation with the patient and his mother.  We discussed his inconclusive CT soft tissue neck results from 02/03/22 and recent MRI scan from 03/03/22. My conclusion is this represents post surgical effects.  He is agreeable to ENT referral for further evaluation of this problem on 03/11/22. I will prescribe him a steroid with slow taper. I discussed the assessment and treatment plan with the patient and his mother.  The patient was provided an opportunity to ask questions and all were answered.  The patient agreed with the plan and demonstrated an understanding of the instructions.  The patient was advised to call back if the symptoms worsen or if the condition fails to improve as anticipated.   I provided 30 minutes of face-to-face time during this encounter and > 50% was spent counseling as documented under my assessment and plan.    [name], MD  [facility] [address] [place] [address] Dept: [phone] Dept Fax: [phone]   No orders of the defined types were placed  in this encounter.   CHIEF COMPLAINT:  CC: Stage IVB oropharynx cancer  Current Treatment: Adjuvant chemoradiation with weekly carboplatin/paclitaxel  HISTORY OF PRESENT ILLNESS:   Oncology History  Oropharyngeal carcinoma ([facility])  11/06/2021 Initial Diagnosis   Oropharyngeal carcinoma ([facility])   11/06/2021 Cancer Staging   Staging form: Pharynx - P16 Negative Oropharynx, AJCC 8th  Edition - Clinical stage from 11/06/2021: Stage IVB (cT1, cN3, cM0, p16-) - Signed by [name], MD on 12/28/2021 Histopathologic type: Squamous cell carcinoma, NOS Stage prefix: Initial diagnosis Histologic grade (G): G2 Histologic grading system: 4 grade system Tumor size (mm): 13 Lymph-vascular invasion (LVI): LVI present/identified, NOS Diagnostic confirmation: Positive histology Specimen type: Excision Staged by: Managing physician Presence of extranodal extension: Present Extent of extranodal extension: Microscopic ENE(+) Distance of extension from the native lymph node capsule to the farthest point of invasion in the extranodal tissue (mm): 6.5 ECOG performance status: Grade 1 Perineural invasion (PNI): Absent Prognostic indicators: 1.3 cm with 1/23 nodes positive -2.3 cm with ENE and focal LVI Stage used in treatment planning: Yes National guidelines used in treatment planning: Yes Type of national guideline used in treatment planning: NCCN   01/06/2022 -  Chemotherapy   Patient is on Treatment Plan : HEAD/NECK Carboplatin + Paclitaxel + XRT q7d     01/19/2022 Imaging   CT neck:  IMPRESSION:  1. 26 x 11 x 11 mm soft tissue mass posterior to the left  submandibular gland and anterior to the SCM. Some residual soft  tissue is noted in the PET scan. While this may represent  postoperative scar tissue, it is concerning for residual or  recurrent tumor.  2. Asymmetric soft tissue at the left glossal tonsillar sulcus with  focal surgical clips. No definite mass lesion is present.  3. Left carotid stent is in place. Vessel appears patent.  4. Atherosclerotic calcifications at the right carotid bifurcation  with what appears to be a high-grade proximal right ICA stenosis.  5. Right IJ Port-A-Cath in place.        INTERVAL HISTORY:  Ian Case is here today for repeat clinical assessment and to discuss recent MRI scans. He did not show for his last chemo treatment but  he has only completed 5 cycles of chemotherapy and we will go for 2 more. He has not had radiation either for 3 days. He is awaiting to see the results of the MRI to determine on whether he would like to proceed with treatment. His most recent MRI scan reveals swelling/inflammation and concluded this was post surgical changes, post treatment change, but cannot exclude if there is cancer on the tongue. CT scan in October 2023  revealed soft tissue mass affect, but we really don't see any mass on the MRI. He has an appointment with Dr.L[place] at [facility] on 03/11/22.He does report that his jaw, up to his head and neck still hurts since his surgery from August 2023. He states that he takes 4 Goodie powders to help with the inflammation/pain. I recommended that he stop that.  I will prescribe a steroid with slow taper to help. He denies fevers or chills. His appetite is good. His weight has increased 1 pound since his last visit.   REVIEW OF SYSTEMS:  Review of Systems  Constitutional:  Negative for appetite change, chills, fever and unexpected weight change.  HENT:   Negative for lump/mass, mouth sores and sore throat.        + left ear pain intermittently + left  jaw pain + left facial swelling  Eyes: Negative.   Respiratory: Negative.  Negative for chest tightness, cough, hemoptysis, shortness of breath and wheezing.   Cardiovascular: Negative.  Negative for chest pain, leg swelling and palpitations.  Gastrointestinal: Negative.  Negative for abdominal distention, abdominal pain, blood in stool, constipation, diarrhea, nausea and vomiting.  Endocrine: Negative.  Negative for hot flashes.  Genitourinary: Negative.  Negative for difficulty urinating, dysuria, frequency and hematuria.   Musculoskeletal:  Positive for arthralgias and myalgias. Negative for back pain, flank pain and gait problem.  Skin: Negative.   Neurological: Negative.  Negative for dizziness, extremity weakness, gait problem,  headaches, light-headedness, numbness, seizures and speech difficulty.  Hematological: Negative.  Negative for adenopathy. Does not bruise/bleed easily.  Psychiatric/Behavioral: Negative.  Negative for depression and sleep disturbance. The patient is not nervous/anxious.      VITALS:  Blood pressure (!) 142/81, pulse 79, temperature 97.7 F (36.5 C), temperature source Oral, resp. rate 18, height '5\' 9"'$  (1.753 m), weight 246 lb 14.4 oz (112 kg), SpO2 98 %.  Wt Readings from Last 3 Encounters:  03/04/22 246 lb 14.4 oz (112 kg)  02/22/22 245 lb 9.6 oz (111.4 kg)  02/18/22 242 lb (109.8 kg)    Body mass index is 36.46 kg/m.  Performance status (ECOG): 1 - Symptomatic but completely ambulatory  PHYSICAL EXAM:  Physical Exam Vitals and nursing note reviewed.  Constitutional:      General: He is not in acute distress.    Appearance: Normal appearance. He is normal weight. He is not ill-appearing or toxic-appearing.  HENT:     Head: Normocephalic and atraumatic.     Comments: Large scar left neck. Swelling and erythema of left face with severe TTP. Mild edema of bilateral neck areas but these are not tender or erythematous.    Mouth/Throat:     Mouth: Mucous membranes are moist.     Pharynx: Oropharynx is clear. No oropharyngeal exudate or posterior oropharyngeal erythema.  Eyes:     General: No scleral icterus.    Extraocular Movements: Extraocular movements intact.     Conjunctiva/sclera: Conjunctivae normal.     Pupils: Pupils are equal, round, and reactive to light.  Cardiovascular:     Rate and Rhythm: Normal rate and regular rhythm.     Pulses: Normal pulses.     Heart sounds: Normal heart sounds. No murmur heard.    No friction rub. No gallop.  Pulmonary:     Effort: Pulmonary effort is normal. No respiratory distress.     Breath sounds: Normal breath sounds. No wheezing, rhonchi or rales.  Abdominal:     General: Bowel sounds are normal. There is no distension.      Palpations: Abdomen is soft. There is no hepatomegaly, splenomegaly or mass.     Tenderness: There is no abdominal tenderness.  Musculoskeletal:        General: No swelling or tenderness. Normal range of motion.     Cervical back: Normal range of motion and neck supple. No tenderness.     Right lower leg: No edema.     Left lower leg: No edema.  Lymphadenopathy:     Cervical: No cervical adenopathy.     Upper Body:     Right upper body: No supraclavicular or axillary adenopathy.     Left upper body: No supraclavicular or axillary adenopathy.     Lower Body: No right inguinal adenopathy. No left inguinal adenopathy.  Skin:    General: Skin  is warm and dry.     Coloration: Skin is not jaundiced.     Findings: No rash.  Neurological:     Mental Status: He is alert and oriented to person, place, and time.     Cranial Nerves: No cranial nerve deficit.  Psychiatric:        Mood and Affect: Mood normal.        Behavior: Behavior normal.        Thought Content: Thought content normal.     LABS:      Latest Ref Rng & Units 02/16/2022    8:36 AM 02/09/2022   10:15 AM 02/02/2022   12:00 AM  CBC  WBC 4.0 - 10.5 K/uL 3.2  3.6  4.6      Hemoglobin 13.0 - 17.0 g/dL 13.1  14.0  13.7      Hematocrit 39.0 - 52.0 % 38.6  42.0  39      Platelets 150 - 400 K/uL 196  185  136         This result is from an external source.      Latest Ref Rng & Units 02/22/2022    3:00 PM 02/16/2022    8:36 AM 02/09/2022   10:15 AM  CMP  Glucose 70 - 99 mg/dL 78  90  98   BUN 6 - 20 mg/dL '25  17  16   '$ Creatinine 0.61 - 1.24 mg/dL 0.76  0.69  0.68   Sodium 135 - 145 mmol/L 138  143  142   Potassium 3.5 - 5.1 mmol/L 3.5  4.1  4.1   Chloride 98 - 111 mmol/L 99  108  103   CO2 22 - 32 mmol/L '31  30  25   '$ Calcium 8.9 - 10.3 mg/dL 9.0  9.4  9.6   Total Protein 6.5 - 8.1 g/dL 6.6  6.9  6.7   Total Bilirubin 0.3 - 1.2 mg/dL 0.4  0.5  0.5   Alkaline Phos 38 - 126 U/L 57  64  54   AST 15 - 41 U/L '16  22  22    '$ ALT 0 - 44 U/L '18  25  25      '$ No results found for: "CEA1", "CEA" / No results found for: "CEA1", "CEA" No results found for: "PSA1" No results found for: "ERX540" No results found for: "CAN125"  No results found for: "TOTALPROTELP", "ALBUMINELP", "A1GS", "A2GS", "BETS", "BETA2SER", "GAMS", "MSPIKE", "SPEI" No results found for: "TIBC", "FERRITIN", "IRONPCTSAT" No results found for: "LDH"  STUDIES:  MR ORBITS W WO CONTRAST  Result Date: 03/03/2022 CLINICAL DATA:  Patient has a history of oropharyngeal carcinoma status post chemotherapy and surgical resection (hemiglossectomy and tonsillectomy). EXAM: MRI OF THE NECK WITH CONTRAST MRI OF THE NECK WITHOUT AND WITH CONTRAST TECHNIQUE: Multiplanar, multisequence MR imaging was performed following the administration of intravenous contrast. Multiplanar, multi-echo pulse sequences of the orbits and surrounding structures were acquired including fat saturation techniques, before and after intravenous contrast administration. CONTRAST:  50m GADAVIST GADOBUTROL 1 MMOL/ML IV SOLN COMPARISON:  CT Neck 01/19/22 FINDINGS: ORBITS FINDINGS: Orbits:  Right-sided lens replacement. Visualized sinuses: Air-fluid level in the right sphenoid sinus. Mild mucosal thickening bilateral ethmoid sinuses. Limited intracranial: Chronic infarct in the central pons (neck series 7, image 1). Chronic infarcts in the bilateral basal ganglia. There is also a chronic infarct in the left parietal lobe. NECK FINDINGS: Pharynx and larynx: Patient is status post left hemiglossectomy and tonsillectomy. There is asymmetric  contrast enhancement along the left aspect of the tongue (orbits series 14, image 1). There is asymmetric T2 hyperintense signal in the in the parapharyngeal space on the left (series 7, image 16) and in the soft tissues overlying the left sternocleidomastoid muscle body (series 7, image 23). T2 hyperintense signal is also seen in the soft tissues around the angle of  the mandible on the left (series 7, image 18/series 6, image 28), particularly along the posterior and inferior aspect of the mylohyoid and the medial pterygoid, respectively. Asymmetric T2 hyperintense signal is also present within the lateral pterygoid muscle body on the left (series 7, image 7). No discrete soft tissue mass is visualized in the region of the left submandibular gland the correlate for the finding seen on prior CT of the neck dated 01/19/2022, particularly along the anterior superior margin of the left SCM. No abnormal lymphadenopathy seen in this region. Mild asymmetric contrast enhancement and T2 hyperintense signal is also seen within the paraspinal musculature on the left (series 7, image 40). Salivary glands: There is mild asymmetric T2 hyperintense signal along the lateral and posterior margins of the left submandibular gland. The left parotid gland is normal in appearance. The intraparotid facial nerve side demonstrate asymmetric contrast enhancement to suggest perineural spread of tumor. Thyroid: Normal Lymph nodes: No suspicious lymphadenopathy. Vascular: Normal flow voids. Mastoids and visualized paranasal sinuses: Bilateral maxillary sinuses are clear. Trace left mastoid effusion. Skeleton: No evidence of high-grade spinal canal stenosis. No suspicious lesions visualized. Upper chest: Negative Other: None IMPRESSION: 1. Postsurgical changes of left hemiglossectomy, tonsillectomy, and likely left neck dissection. There is ill defined T2 hyperintense signal in the region of the left submandibular gland at the area of abnormality seen on recent prior CT neck, but without a discrete soft tissue mass. There is also asymmetric T2 hyperintense signal abnormality and contrast enhancement in the left parapharyngeal space, surrounding the medial pterygoid and masseter muscle bodies, and along the posterior margin of the mylohyoid on the left. These findings are favored to represent evolving  post-surgical changes, but close follow up is recommended to ensure continued resolution of signal abnormality and soft tissue mass seen on recent CT. 2. There is asymmetric contrast enhancement along the left aspect of the tongue. Findings are favored to represent post treatment change, with residual tumor not entirely excluded. Correlate with direct visualization. 3. Mild asymmetric T2 hyperintense signal and contrast enhancement within the paraspinal musculature on the left, nonspecific, possibly post-traumatic. 4. Chronic infarcts in the central pons, bilateral basal ganglia, and left parietal lobe. 5. Air-fluid level in the right sphenoid sinus. Correlate for symptoms of acute sinusitis. Electronically Signed   By: Marin Roberts M.D.   On: 03/03/2022 09:30   MR NECK SOFT TISSUE ONLY W WO CONTRAST  Result Date: 03/03/2022 CLINICAL DATA:  Patient has a history of oropharyngeal carcinoma status post chemotherapy and surgical resection (hemiglossectomy and tonsillectomy). EXAM: MRI OF THE NECK WITH CONTRAST MRI OF THE NECK WITHOUT AND WITH CONTRAST TECHNIQUE: Multiplanar, multisequence MR imaging was performed following the administration of intravenous contrast. Multiplanar, multi-echo pulse sequences of the orbits and surrounding structures were acquired including fat saturation techniques, before and after intravenous contrast administration. CONTRAST:  90m GADAVIST GADOBUTROL 1 MMOL/ML IV SOLN COMPARISON:  CT Neck 01/19/22 FINDINGS: ORBITS FINDINGS: Orbits:  Right-sided lens replacement. Visualized sinuses: Air-fluid level in the right sphenoid sinus. Mild mucosal thickening bilateral ethmoid sinuses. Limited intracranial: Chronic infarct in the central pons (neck series 7, image 1).  Chronic infarcts in the bilateral basal ganglia. There is also a chronic infarct in the left parietal lobe. NECK FINDINGS: Pharynx and larynx: Patient is status post left hemiglossectomy and tonsillectomy. There is asymmetric  contrast enhancement along the left aspect of the tongue (orbits series 14, image 1). There is asymmetric T2 hyperintense signal in the in the parapharyngeal space on the left (series 7, image 16) and in the soft tissues overlying the left sternocleidomastoid muscle body (series 7, image 23). T2 hyperintense signal is also seen in the soft tissues around the angle of the mandible on the left (series 7, image 18/series 6, image 28), particularly along the posterior and inferior aspect of the mylohyoid and the medial pterygoid, respectively. Asymmetric T2 hyperintense signal is also present within the lateral pterygoid muscle body on the left (series 7, image 7). No discrete soft tissue mass is visualized in the region of the left submandibular gland the correlate for the finding seen on prior CT of the neck dated 01/19/2022, particularly along the anterior superior margin of the left SCM. No abnormal lymphadenopathy seen in this region. Mild asymmetric contrast enhancement and T2 hyperintense signal is also seen within the paraspinal musculature on the left (series 7, image 40). Salivary glands: There is mild asymmetric T2 hyperintense signal along the lateral and posterior margins of the left submandibular gland. The left parotid gland is normal in appearance. The intraparotid facial nerve side demonstrate asymmetric contrast enhancement to suggest perineural spread of tumor. Thyroid: Normal Lymph nodes: No suspicious lymphadenopathy. Vascular: Normal flow voids. Mastoids and visualized paranasal sinuses: Bilateral maxillary sinuses are clear. Trace left mastoid effusion. Skeleton: No evidence of high-grade spinal canal stenosis. No suspicious lesions visualized. Upper chest: Negative Other: None IMPRESSION: 1. Postsurgical changes of left hemiglossectomy, tonsillectomy, and likely left neck dissection. There is ill defined T2 hyperintense signal in the region of the left submandibular gland at the area of  abnormality seen on recent prior CT neck, but without a discrete soft tissue mass. There is also asymmetric T2 hyperintense signal abnormality and contrast enhancement in the left parapharyngeal space, surrounding the medial pterygoid and masseter muscle bodies, and along the posterior margin of the mylohyoid on the left. These findings are favored to represent evolving post-surgical changes, but close follow up is recommended to ensure continued resolution of signal abnormality and soft tissue mass seen on recent CT. 2. There is asymmetric contrast enhancement along the left aspect of the tongue. Findings are favored to represent post treatment change, with residual tumor not entirely excluded. Correlate with direct visualization. 3. Mild asymmetric T2 hyperintense signal and contrast enhancement within the paraspinal musculature on the left, nonspecific, possibly post-traumatic. 4. Chronic infarcts in the central pons, bilateral basal ganglia, and left parietal lobe. 5. Air-fluid level in the right sphenoid sinus. Correlate for symptoms of acute sinusitis. Electronically Signed   By: Marin Roberts M.D.   On: 03/03/2022 09:30     CT soft tissue neck 02/03/2022    NM PET skull base to mid thigh 01/13/2022     Chest Xray 1 view 12/22/2021    HISTORY:   Past Medical History:  Diagnosis Date   Arthritis    Back pain    GERD (gastroesophageal reflux disease)    History of kidney stones    Hypertension    Sleep apnea    does not have cpap yet    Past Surgical History:  Procedure Laterality Date   APPENDECTOMY     HAND SURGERY  LUMBAR LAMINECTOMY/DECOMPRESSION MICRODISCECTOMY N/A 12/28/2017   Procedure: Laminectomy and Foraminotomy Lumbar two-three-Lumbar three-four - Lumbar four-five;  Surgeon: Kary Kos, MD;  Location: [facility];  Service: Neurosurgery;  Laterality: N/A;   OTHER SURGICAL HISTORY     removal on cancer from neck    Family History  Problem Relation Age of Onset    Thyroid disease Mother    Heart disease Father    Prostate cancer Brother    Healthy Daughter    Healthy Son     Social History:  reports that he has been smoking cigarettes. He has a 22.00 pack-year smoking history. He has never used smokeless tobacco. He reports that he does not currently use alcohol. He reports that he does not use drugs.The patient is accompanied by his wife today.  Allergies: No Known Allergies  Current Medications: Current Outpatient Medications  Medication Sig Dispense Refill   amitriptyline (ELAVIL) 75 MG tablet Take 1 tablet by mouth daily.     amLODipine (NORVASC) 10 MG tablet Take 1 tablet by mouth every morning.     aspirin EC 81 MG tablet Take by mouth.     atorvastatin (LIPITOR) 80 MG tablet Take 80 mg by mouth daily.     chlorhexidine (PERIDEX) 0.12 % solution SMARTSIG:1 Capful(s) By Mouth Twice Daily     cyanocobalamin (VITAMIN B12) 1000 MCG/ML injection Inject 1 mL every month by subcutaneous route for 1 day.     dexamethasone 0.5 MG/5ML elixir Take by mouth.     fenofibrate 54 MG tablet TAKE ONE TABLET BY MOUTH EVERY DAY FOR 30 DAYS     gabapentin (NEURONTIN) 800 MG tablet Take 800 mg by mouth 4 (four) times daily.     HYDROcodone-acetaminophen (NORCO) 10-325 MG tablet Take by mouth.     ibuprofen (ADVIL) 800 MG tablet Take by mouth.     ketorolac (TORADOL) 10 MG tablet Take 10 mg by mouth every 6 (six) hours as needed.     lidocaine (XYLOCAINE) 2 % solution SMARTSIG:15 Milliliter(s) By Mouth Every 3 Hours PRN     magic mouthwash SOLN Take 15 mLs by mouth in the morning, at noon, in the evening, and at bedtime.     nitroGLYCERIN (NITROSTAT) 0.4 MG SL tablet PLACE 1 TABLET UNDER THE TONGUE EVERY 5 MIN FOR CHEST PAIN AS NEEDED. NO MORE THAN 3 TABS IN 15 MIN.     nystatin (MYCOSTATIN) 100000 UNIT/ML suspension Swish and swallow 4 (four) times daily     ondansetron (ZOFRAN) 8 MG tablet Take 1 tablet (8 mg total) by mouth every 8 (eight) hours as needed for  nausea or vomiting. Start on the third day after chemotherapy. 30 tablet 1   oxyCODONE-acetaminophen (PERCOCET/ROXICET) 5-325 MG tablet Take 1-2 tablets by mouth every 4 (four) hours as needed for severe pain. (Patient not taking: Reported on 01/18/2022) 15 tablet 0   pantoprazole (PROTONIX) 40 MG tablet Take 1 tablet by mouth daily.     predniSONE (DELTASONE) 10 MG tablet Take 4 tablets (40 mg total) by mouth daily with breakfast. Take 4 tablets by mouth daily for 7 days, then 3 tablets by mouth daily for 7 days, then 2 tablets by mouth daily for 7 days and then 1 tablet by mouth daily 70 tablet 0   prochlorperazine (COMPAZINE) 10 MG tablet Take 1 tablet (10 mg total) by mouth every 6 (six) hours as needed for nausea or vomiting. 30 tablet 1   senna-docusate (SENOKOT-S) 8.6-50 MG tablet Take by mouth.  tiZANidine (ZANAFLEX) 4 MG tablet Take by mouth.     triamterene-hydrochlorothiazide (MAXZIDE-25) 37.5-25 MG tablet Take 1 tablet by mouth daily.     No current facility-administered medications for this visit.      I,Gabriella Ballesteros,acting as a scribe for [name], MD.,have documented all relevant documentation on the behalf of [name], MD,as directed by  [name], MD while in the presence of [name], MD.   I have reviewed this report as typed by the medical scribe, and it is complete and accurate.  [name]   03/05/22 7:15 PM

## 2022-03-04 NOTE — Telephone Encounter (Signed)
Call [facility] referral department. Appointment with Dr. Avon Gully on Dec. 7, 2023 at 3pm at the [address] [place] location. Patient made aware and Dr. Hinton Rao aware.

## 2022-03-05 ENCOUNTER — Other Ambulatory Visit: Payer: Self-pay | Admitting: Oncology

## 2022-03-05 ENCOUNTER — Encounter: Payer: Self-pay | Admitting: Oncology

## 2022-03-05 DIAGNOSIS — C109 Malignant neoplasm of oropharynx, unspecified: Secondary | ICD-10-CM

## 2022-03-07 NOTE — Progress Notes (Deleted)
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 This is p16 negative. He has several concerning features on his pathology, including 1 positive node with extranodal extension and focal lymphovascular invasion. He was healing well, and then developed left facial pain and swelling, this occurred prior to initiating any chemoradiation. CT scan was nonspecific. His ENT surgeon recommended that we proceed with his postoperative treatment of adjuvant concurrent chemoradiation.  Painful left facial swelling  We can see soft tissue swelling on the CT scan and thought that this represented parotiditis. We had him initially on 1 antibiotic then changed to Clindamycin but neither helped. We asked his ENT surgeon for an opinion and he did not seem to know what this was either. He recommended we proceed with treatment and so we have. This does not seem to be spread of his throat cancer but did not respond to antibiotics and is very erythematous and tender and fluctuates up and down. I recommended a referral to a tertiary care center ENT and we will get an MRI.  Chronic Back Pain Spinal stenosis with 4 back surgeries including vertebral fusion. Followed by [facility] with plans for L-spine MRI next week.   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 He had carotid angioplasty on the left and may eventually require it on the right. We do see some chronic infarctions in the pons and basal ganglia.   Tinnitus/Hearing Loss We may want to avoid cisplatin as it may worsen this. I previously referred him to an audiologist.   Tobacco Abuse He is motivated to quit and is currently on Chantix and is down to one half pack per  day.      Plan: He is frustrated with his persistent left-sided facial swelling and pain as he would like to know what is causing it. He has stopped chemotherapy as he can't see any improvement with only 5 cycles. I have tried to be clear that this is two separate issues and the left facial pain and swelling started well before the start of chemoradiation. We had no expectation that it would improve with his treatment but reviewed the indications for his chemoradiation with the patient and his mother.  We discussed his inconclusive CT soft tissue neck results from 02/03/22 and recent MRI scan from 03/03/22. My conclusion is this represents post surgical effects.  He is agreeable to ENT referral for further evaluation of this problem on 03/11/22. I will prescribe him a steroid with slow taper. I discussed the assessment and treatment plan with the patient and his mother.  The patient was provided an opportunity to ask questions and all were answered.  The patient agreed with the plan and demonstrated an understanding of the instructions.  The patient was advised to call back if the symptoms worsen or if the condition fails to improve as anticipated.   I provided 30 minutes of face-to-face time during this encounter and > 50% was spent counseling as documented under my assessment and plan.    [name], MD  [facility] [address] [place] [address] Dept: [phone] Dept Fax: [phone]   No orders of the defined types were placed  in this encounter.   CHIEF COMPLAINT:  CC: Stage IVB oropharynx cancer  Current Treatment: Adjuvant chemoradiation with weekly carboplatin/paclitaxel  HISTORY OF PRESENT ILLNESS:   Oncology History  Oropharyngeal carcinoma ([place])  11/06/2021 Initial Diagnosis   Oropharyngeal carcinoma ([place])   11/06/2021 Cancer Staging   Staging form: Pharynx - P16 Negative Oropharynx, AJCC 8th  Edition - Clinical stage from 11/06/2021: Stage IVB (cT1, cN3, cM0, p16-) - Signed by [name], MD on 12/28/2021 Histopathologic type: Squamous cell carcinoma, NOS Stage prefix: Initial diagnosis Histologic grade (G): G2 Histologic grading system: 4 grade system Tumor size (mm): 13 Lymph-vascular invasion (LVI): LVI present/identified, NOS Diagnostic confirmation: Positive histology Specimen type: Excision Staged by: Managing physician Presence of extranodal extension: Present Extent of extranodal extension: Microscopic ENE(+) Distance of extension from the native lymph node capsule to the farthest point of invasion in the extranodal tissue (mm): 6.5 ECOG performance status: Grade 1 Perineural invasion (PNI): Absent Prognostic indicators: 1.3 cm with 1/23 nodes positive -2.3 cm with ENE and focal LVI Stage used in treatment planning: Yes National guidelines used in treatment planning: Yes Type of national guideline used in treatment planning: NCCN   01/06/2022 -  Chemotherapy   Patient is on Treatment Plan : HEAD/NECK Carboplatin + Paclitaxel + XRT q7d     01/19/2022 Imaging   CT neck:  IMPRESSION:  1. 26 x 11 x 11 mm soft tissue mass posterior to the left  submandibular gland and anterior to the SCM. Some residual soft  tissue is noted in the PET scan. While this may represent  postoperative scar tissue, it is concerning for residual or  recurrent tumor.  2. Asymmetric soft tissue at the left glossal tonsillar sulcus with  focal surgical clips. No definite mass lesion is present.  3. Left carotid stent is in place. Vessel appears patent.  4. Atherosclerotic calcifications at the right carotid bifurcation  with what appears to be a high-grade proximal right ICA stenosis.  5. Right IJ Port-A-Cath in place.        INTERVAL HISTORY:  Ian Case is here today for repeat clinical assessment and to discuss recent MRI scans. He did not show for his last chemo treatment but  he has only completed 5 cycles of chemotherapy and we will go for 2 more. He has not had radiation either for 3 days. He is awaiting to see the results of the MRI to determine on whether he would like to proceed with treatment. His most recent MRI scan reveals swelling/inflammation and concluded this was post surgical changes, post treatment change, but cannot exclude if there is cancer on the tongue. CT scan in October 2023  revealed soft tissue mass affect, but we really don't see any mass on the MRI. He has an appointment with Dr.L[place] at [facility] on 03/11/22.He does report that his jaw, up to his head and neck still hurts since his surgery from August 2023. He states that he takes 4 Goodie powders to help with the inflammation/pain. I recommended that he stop that.  I will prescribe a steroid with slow taper to help. He denies fevers or chills. His appetite is good. His weight has increased 1 pound since his last visit.   REVIEW OF SYSTEMS:  Review of Systems  Constitutional:  Negative for appetite change, chills, fever and unexpected weight change.  HENT:   Positive for sore throat and tinnitus. Negative for lump/mass and mouth sores.        + left ear  pain intermittently + left jaw pain + left facial swelling  Eyes: Negative.   Respiratory: Negative.  Negative for chest tightness, cough, hemoptysis, shortness of breath and wheezing.   Cardiovascular: Negative.  Negative for chest pain, leg swelling and palpitations.  Gastrointestinal: Negative.  Negative for abdominal distention, abdominal pain, blood in stool, constipation, diarrhea, nausea and vomiting.  Endocrine: Negative.  Negative for hot flashes.  Genitourinary: Negative.  Negative for difficulty urinating, dysuria, frequency and hematuria.   Musculoskeletal:  Positive for arthralgias and myalgias. Negative for back pain, flank pain and gait problem.  Skin: Negative.   Neurological: Negative.  Negative for dizziness, extremity  weakness, gait problem, headaches, light-headedness, numbness, seizures and speech difficulty.  Hematological: Negative.  Negative for adenopathy. Does not bruise/bleed easily.  Psychiatric/Behavioral: Negative.  Negative for depression and sleep disturbance. The patient is not nervous/anxious.      VITALS:  There were no vitals taken for this visit.  Wt Readings from Last 3 Encounters:  03/04/22 246 lb 14.4 oz (112 kg)  02/22/22 245 lb 9.6 oz (111.4 kg)  02/18/22 242 lb (109.8 kg)    There is no height or weight on file to calculate BMI.  Performance status (ECOG): 1 - Symptomatic but completely ambulatory  PHYSICAL EXAM:  Physical Exam Vitals and nursing note reviewed.  Constitutional:      General: He is not in acute distress.    Appearance: Normal appearance. He is normal weight. He is not ill-appearing or toxic-appearing.  HENT:     Head: Normocephalic and atraumatic.     Comments: Large scar left neck. Swelling and erythema of left face with severe TTP. Mild edema of bilateral neck areas but these are not tender or erythematous.    Mouth/Throat:     Mouth: Mucous membranes are moist.     Pharynx: Oropharynx is clear. No oropharyngeal exudate or posterior oropharyngeal erythema.  Eyes:     General: No scleral icterus.    Extraocular Movements: Extraocular movements intact.     Conjunctiva/sclera: Conjunctivae normal.     Pupils: Pupils are equal, round, and reactive to light.  Cardiovascular:     Rate and Rhythm: Normal rate and regular rhythm.     Pulses: Normal pulses.     Heart sounds: Normal heart sounds. No murmur heard.    No friction rub. No gallop.  Pulmonary:     Effort: Pulmonary effort is normal. No respiratory distress.     Breath sounds: Normal breath sounds. No wheezing, rhonchi or rales.  Abdominal:     General: Bowel sounds are normal. There is no distension.     Palpations: Abdomen is soft. There is no hepatomegaly, splenomegaly or mass.      Tenderness: There is no abdominal tenderness.  Musculoskeletal:        General: No swelling or tenderness. Normal range of motion.     Cervical back: Normal range of motion and neck supple. No tenderness.     Right lower leg: No edema.     Left lower leg: No edema.  Lymphadenopathy:     Cervical: No cervical adenopathy.     Upper Body:     Right upper body: No supraclavicular or axillary adenopathy.     Left upper body: No supraclavicular or axillary adenopathy.     Lower Body: No right inguinal adenopathy. No left inguinal adenopathy.  Skin:    General: Skin is warm and dry.     Coloration: Skin is not jaundiced.  Findings: No rash.  Neurological:     Mental Status: He is alert and oriented to person, place, and time.     Cranial Nerves: No cranial nerve deficit.  Psychiatric:        Mood and Affect: Mood normal.        Behavior: Behavior normal.        Thought Content: Thought content normal.     LABS:      Latest Ref Rng & Units 02/16/2022    8:36 AM 02/09/2022   10:15 AM 02/02/2022   12:00 AM  CBC  WBC 4.0 - 10.5 K/uL 3.2  3.6  4.6      Hemoglobin 13.0 - 17.0 g/dL 13.1  14.0  13.7      Hematocrit 39.0 - 52.0 % 38.6  42.0  39      Platelets 150 - 400 K/uL 196  185  136         This result is from an external source.       Latest Ref Rng & Units 02/22/2022    3:00 PM 02/16/2022    8:36 AM 02/09/2022   10:15 AM  CMP  Glucose 70 - 99 mg/dL 78  90  98   BUN 6 - 20 mg/dL '25  17  16   '$ Creatinine 0.61 - 1.24 mg/dL 0.76  0.69  0.68   Sodium 135 - 145 mmol/L 138  143  142   Potassium 3.5 - 5.1 mmol/L 3.5  4.1  4.1   Chloride 98 - 111 mmol/L 99  108  103   CO2 22 - 32 mmol/L '31  30  25   '$ Calcium 8.9 - 10.3 mg/dL 9.0  9.4  9.6   Total Protein 6.5 - 8.1 g/dL 6.6  6.9  6.7   Total Bilirubin 0.3 - 1.2 mg/dL 0.4  0.5  0.5   Alkaline Phos 38 - 126 U/L 57  64  54   AST 15 - 41 U/L '16  22  22   '$ ALT 0 - 44 U/L '18  25  25      '$ No results found for: "CEA1", "CEA" / No  results found for: "CEA1", "CEA" No results found for: "PSA1" No results found for: "UXN235" No results found for: "CAN125"  No results found for: "TOTALPROTELP", "ALBUMINELP", "A1GS", "A2GS", "BETS", "BETA2SER", "GAMS", "MSPIKE", "SPEI" No results found for: "TIBC", "FERRITIN", "IRONPCTSAT" No results found for: "LDH"  STUDIES:  MR ORBITS W WO CONTRAST  Result Date: 03/03/2022 CLINICAL DATA:  Patient has a history of oropharyngeal carcinoma status post chemotherapy and surgical resection (hemiglossectomy and tonsillectomy). EXAM: MRI OF THE NECK WITH CONTRAST MRI OF THE NECK WITHOUT AND WITH CONTRAST TECHNIQUE: Multiplanar, multisequence MR imaging was performed following the administration of intravenous contrast. Multiplanar, multi-echo pulse sequences of the orbits and surrounding structures were acquired including fat saturation techniques, before and after intravenous contrast administration. CONTRAST:  45m GADAVIST GADOBUTROL 1 MMOL/ML IV SOLN COMPARISON:  CT Neck 01/19/22 FINDINGS: ORBITS FINDINGS: Orbits:  Right-sided lens replacement. Visualized sinuses: Air-fluid level in the right sphenoid sinus. Mild mucosal thickening bilateral ethmoid sinuses. Limited intracranial: Chronic infarct in the central pons (neck series 7, image 1). Chronic infarcts in the bilateral basal ganglia. There is also a chronic infarct in the left parietal lobe. NECK FINDINGS: Pharynx and larynx: Patient is status post left hemiglossectomy and tonsillectomy. There is asymmetric contrast enhancement along the left aspect of the tongue (orbits series 14, image 1). There is  asymmetric T2 hyperintense signal in the in the parapharyngeal space on the left (series 7, image 16) and in the soft tissues overlying the left sternocleidomastoid muscle body (series 7, image 23). T2 hyperintense signal is also seen in the soft tissues around the angle of the mandible on the left (series 7, image 18/series 6, image 28), particularly  along the posterior and inferior aspect of the mylohyoid and the medial pterygoid, respectively. Asymmetric T2 hyperintense signal is also present within the lateral pterygoid muscle body on the left (series 7, image 7). No discrete soft tissue mass is visualized in the region of the left submandibular gland the correlate for the finding seen on prior CT of the neck dated 01/19/2022, particularly along the anterior superior margin of the left SCM. No abnormal lymphadenopathy seen in this region. Mild asymmetric contrast enhancement and T2 hyperintense signal is also seen within the paraspinal musculature on the left (series 7, image 40). Salivary glands: There is mild asymmetric T2 hyperintense signal along the lateral and posterior margins of the left submandibular gland. The left parotid gland is normal in appearance. The intraparotid facial nerve side demonstrate asymmetric contrast enhancement to suggest perineural spread of tumor. Thyroid: Normal Lymph nodes: No suspicious lymphadenopathy. Vascular: Normal flow voids. Mastoids and visualized paranasal sinuses: Bilateral maxillary sinuses are clear. Trace left mastoid effusion. Skeleton: No evidence of high-grade spinal canal stenosis. No suspicious lesions visualized. Upper chest: Negative Other: None IMPRESSION: 1. Postsurgical changes of left hemiglossectomy, tonsillectomy, and likely left neck dissection. There is ill defined T2 hyperintense signal in the region of the left submandibular gland at the area of abnormality seen on recent prior CT neck, but without a discrete soft tissue mass. There is also asymmetric T2 hyperintense signal abnormality and contrast enhancement in the left parapharyngeal space, surrounding the medial pterygoid and masseter muscle bodies, and along the posterior margin of the mylohyoid on the left. These findings are favored to represent evolving post-surgical changes, but close follow up is recommended to ensure continued  resolution of signal abnormality and soft tissue mass seen on recent CT. 2. There is asymmetric contrast enhancement along the left aspect of the tongue. Findings are favored to represent post treatment change, with residual tumor not entirely excluded. Correlate with direct visualization. 3. Mild asymmetric T2 hyperintense signal and contrast enhancement within the paraspinal musculature on the left, nonspecific, possibly post-traumatic. 4. Chronic infarcts in the central pons, bilateral basal ganglia, and left parietal lobe. 5. Air-fluid level in the right sphenoid sinus. Correlate for symptoms of acute sinusitis. Electronically Signed   By: Marin Roberts M.D.   On: 03/03/2022 09:30   MR NECK SOFT TISSUE ONLY W WO CONTRAST  Result Date: 03/03/2022 CLINICAL DATA:  Patient has a history of oropharyngeal carcinoma status post chemotherapy and surgical resection (hemiglossectomy and tonsillectomy). EXAM: MRI OF THE NECK WITH CONTRAST MRI OF THE NECK WITHOUT AND WITH CONTRAST TECHNIQUE: Multiplanar, multisequence MR imaging was performed following the administration of intravenous contrast. Multiplanar, multi-echo pulse sequences of the orbits and surrounding structures were acquired including fat saturation techniques, before and after intravenous contrast administration. CONTRAST:  10m GADAVIST GADOBUTROL 1 MMOL/ML IV SOLN COMPARISON:  CT Neck 01/19/22 FINDINGS: ORBITS FINDINGS: Orbits:  Right-sided lens replacement. Visualized sinuses: Air-fluid level in the right sphenoid sinus. Mild mucosal thickening bilateral ethmoid sinuses. Limited intracranial: Chronic infarct in the central pons (neck series 7, image 1). Chronic infarcts in the bilateral basal ganglia. There is also a chronic infarct in the left  parietal lobe. NECK FINDINGS: Pharynx and larynx: Patient is status post left hemiglossectomy and tonsillectomy. There is asymmetric contrast enhancement along the left aspect of the tongue (orbits series 14,  image 1). There is asymmetric T2 hyperintense signal in the in the parapharyngeal space on the left (series 7, image 16) and in the soft tissues overlying the left sternocleidomastoid muscle body (series 7, image 23). T2 hyperintense signal is also seen in the soft tissues around the angle of the mandible on the left (series 7, image 18/series 6, image 28), particularly along the posterior and inferior aspect of the mylohyoid and the medial pterygoid, respectively. Asymmetric T2 hyperintense signal is also present within the lateral pterygoid muscle body on the left (series 7, image 7). No discrete soft tissue mass is visualized in the region of the left submandibular gland the correlate for the finding seen on prior CT of the neck dated 01/19/2022, particularly along the anterior superior margin of the left SCM. No abnormal lymphadenopathy seen in this region. Mild asymmetric contrast enhancement and T2 hyperintense signal is also seen within the paraspinal musculature on the left (series 7, image 40). Salivary glands: There is mild asymmetric T2 hyperintense signal along the lateral and posterior margins of the left submandibular gland. The left parotid gland is normal in appearance. The intraparotid facial nerve side demonstrate asymmetric contrast enhancement to suggest perineural spread of tumor. Thyroid: Normal Lymph nodes: No suspicious lymphadenopathy. Vascular: Normal flow voids. Mastoids and visualized paranasal sinuses: Bilateral maxillary sinuses are clear. Trace left mastoid effusion. Skeleton: No evidence of high-grade spinal canal stenosis. No suspicious lesions visualized. Upper chest: Negative Other: None IMPRESSION: 1. Postsurgical changes of left hemiglossectomy, tonsillectomy, and likely left neck dissection. There is ill defined T2 hyperintense signal in the region of the left submandibular gland at the area of abnormality seen on recent prior CT neck, but without a discrete soft tissue mass.  There is also asymmetric T2 hyperintense signal abnormality and contrast enhancement in the left parapharyngeal space, surrounding the medial pterygoid and masseter muscle bodies, and along the posterior margin of the mylohyoid on the left. These findings are favored to represent evolving post-surgical changes, but close follow up is recommended to ensure continued resolution of signal abnormality and soft tissue mass seen on recent CT. 2. There is asymmetric contrast enhancement along the left aspect of the tongue. Findings are favored to represent post treatment change, with residual tumor not entirely excluded. Correlate with direct visualization. 3. Mild asymmetric T2 hyperintense signal and contrast enhancement within the paraspinal musculature on the left, nonspecific, possibly post-traumatic. 4. Chronic infarcts in the central pons, bilateral basal ganglia, and left parietal lobe. 5. Air-fluid level in the right sphenoid sinus. Correlate for symptoms of acute sinusitis. Electronically Signed   By: Marin Roberts M.D.   On: 03/03/2022 09:30     CT soft tissue neck 02/03/2022    NM PET skull base to mid thigh 01/13/2022     Chest Xray 1 view 12/22/2021    HISTORY:   Past Medical History:  Diagnosis Date   Arthritis    Back pain    GERD (gastroesophageal reflux disease)    History of kidney stones    Hypertension    Sleep apnea    does not have cpap yet    Past Surgical History:  Procedure Laterality Date   APPENDECTOMY     HAND SURGERY     LUMBAR LAMINECTOMY/DECOMPRESSION MICRODISCECTOMY N/A 12/28/2017   Procedure: Laminectomy and Foraminotomy Lumbar  two-three-Lumbar three-four - Lumbar four-five;  Surgeon: Kary Kos, MD;  Location: [facility];  Service: Neurosurgery;  Laterality: N/A;   OTHER SURGICAL HISTORY     removal on cancer from neck    Family History  Problem Relation Age of Onset   Thyroid disease Mother    Heart disease Father    Prostate cancer Brother    Healthy  Daughter    Healthy Son     Social History:  reports that he has been smoking cigarettes. He has a 22.00 pack-year smoking history. He has never used smokeless tobacco. He reports that he does not currently use alcohol. He reports that he does not use drugs.The patient is accompanied by his wife today.  Allergies: No Known Allergies  Current Medications: Current Outpatient Medications  Medication Sig Dispense Refill   amitriptyline (ELAVIL) 75 MG tablet Take 1 tablet by mouth daily.     amLODipine (NORVASC) 10 MG tablet Take 1 tablet by mouth every morning.     aspirin EC 81 MG tablet Take by mouth.     atorvastatin (LIPITOR) 80 MG tablet Take 80 mg by mouth daily.     chlorhexidine (PERIDEX) 0.12 % solution SMARTSIG:1 Capful(s) By Mouth Twice Daily     cyanocobalamin (VITAMIN B12) 1000 MCG/ML injection Inject 1 mL every month by subcutaneous route for 1 day.     dexamethasone 0.5 MG/5ML elixir Take by mouth.     fenofibrate 54 MG tablet TAKE ONE TABLET BY MOUTH EVERY DAY FOR 30 DAYS     gabapentin (NEURONTIN) 800 MG tablet Take 800 mg by mouth 4 (four) times daily.     HYDROcodone-acetaminophen (NORCO) 10-325 MG tablet Take by mouth.     ibuprofen (ADVIL) 800 MG tablet Take by mouth.     ketorolac (TORADOL) 10 MG tablet Take 10 mg by mouth every 6 (six) hours as needed.     lidocaine (XYLOCAINE) 2 % solution SMARTSIG:15 Milliliter(s) By Mouth Every 3 Hours PRN     magic mouthwash SOLN Take 15 mLs by mouth in the morning, at noon, in the evening, and at bedtime.     nitroGLYCERIN (NITROSTAT) 0.4 MG SL tablet PLACE 1 TABLET UNDER THE TONGUE EVERY 5 MIN FOR CHEST PAIN AS NEEDED. NO MORE THAN 3 TABS IN 15 MIN.     nystatin (MYCOSTATIN) 100000 UNIT/ML suspension Swish and swallow 4 (four) times daily     ondansetron (ZOFRAN) 8 MG tablet Take 1 tablet (8 mg total) by mouth every 8 (eight) hours as needed for nausea or vomiting. Start on the third day after chemotherapy. 30 tablet 1    oxyCODONE-acetaminophen (PERCOCET/ROXICET) 5-325 MG tablet Take 1-2 tablets by mouth every 4 (four) hours as needed for severe pain. (Patient not taking: Reported on 01/18/2022) 15 tablet 0   pantoprazole (PROTONIX) 40 MG tablet Take 1 tablet by mouth daily.     predniSONE (DELTASONE) 10 MG tablet Take 4 tablets (40 mg total) by mouth daily with breakfast. Take 4 tablets by mouth daily for 7 days, then 3 tablets by mouth daily for 7 days, then 2 tablets by mouth daily for 7 days and then 1 tablet by mouth daily 70 tablet 0   prochlorperazine (COMPAZINE) 10 MG tablet Take 1 tablet (10 mg total) by mouth every 6 (six) hours as needed for nausea or vomiting. 30 tablet 1   senna-docusate (SENOKOT-S) 8.6-50 MG tablet Take by mouth.     tiZANidine (ZANAFLEX) 4 MG tablet Take by mouth.  triamterene-hydrochlorothiazide (MAXZIDE-25) 37.5-25 MG tablet Take 1 tablet by mouth daily.     No current facility-administered medications for this visit.      I,Gabriella Ballesteros,acting as a scribe for [name], MD.,have documented all relevant documentation on the behalf of [name], MD,as directed by  [name], MD while in the presence of [name], MD.   I have reviewed this report as typed by the medical scribe, and it is complete and accurate.  [name]   03/07/22 10:08 AM

## 2022-03-08 ENCOUNTER — Encounter: Payer: Self-pay | Admitting: Oncology

## 2022-03-08 ENCOUNTER — Inpatient Hospital Stay: Payer: Medicaid Other

## 2022-03-08 ENCOUNTER — Inpatient Hospital Stay: Payer: Medicaid Other | Attending: Hematology and Oncology | Admitting: Oncology

## 2022-03-08 ENCOUNTER — Telehealth: Payer: Self-pay | Admitting: Oncology

## 2022-03-08 VITALS — BP 142/75 | HR 77 | Temp 98.7°F | Resp 16 | Ht 69.0 in | Wt 247.2 lb

## 2022-03-08 DIAGNOSIS — C109 Malignant neoplasm of oropharynx, unspecified: Secondary | ICD-10-CM | POA: Insufficient documentation

## 2022-03-08 DIAGNOSIS — F17210 Nicotine dependence, cigarettes, uncomplicated: Secondary | ICD-10-CM | POA: Insufficient documentation

## 2022-03-08 DIAGNOSIS — E876 Hypokalemia: Secondary | ICD-10-CM

## 2022-03-08 DIAGNOSIS — Z5111 Encounter for antineoplastic chemotherapy: Secondary | ICD-10-CM | POA: Diagnosis present

## 2022-03-08 LAB — CMP (CANCER CENTER ONLY)
ALT: 20 U/L (ref 0–44)
AST: 20 U/L (ref 15–41)
Albumin: 3.9 g/dL (ref 3.5–5.0)
Alkaline Phosphatase: 53 U/L (ref 38–126)
Anion gap: 10 (ref 5–15)
BUN: 18 mg/dL (ref 6–20)
CO2: 28 mmol/L (ref 22–32)
Calcium: 9.3 mg/dL (ref 8.9–10.3)
Chloride: 101 mmol/L (ref 98–111)
Creatinine: 0.94 mg/dL (ref 0.61–1.24)
GFR, Estimated: >60 mL/min (ref >60)
Glucose, Bld: 130 mg/dL — ABNORMAL HIGH (ref 70–99)
Potassium: 3.8 mmol/L (ref 3.5–5.1)
Sodium: 139 mmol/L (ref 135–145)
Total Bilirubin: 0.2 mg/dL — ABNORMAL LOW (ref 0.3–1.2)
Total Protein: 7 g/dL (ref 6.5–8.1)

## 2022-03-08 LAB — CBC W DIFFERENTIAL (~~LOC~~ CC SCANNED REPORT)

## 2022-03-08 LAB — CBC: RBC: 3.7 — AB (ref 3.87–5.11)

## 2022-03-08 LAB — CBC AND DIFFERENTIAL
HCT: 36 — AB (ref 41–53)
Hemoglobin: 12.5 — AB (ref 13.5–17.5)
Neutrophils Absolute: 2.06
Platelets: 143 10*3/uL — AB (ref 150–400)
WBC: 2.9

## 2022-03-08 NOTE — Telephone Encounter (Signed)
03/08/22 Next appt scheduled and confirmed with patient

## 2022-03-08 NOTE — Progress Notes (Incomplete)
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 This is p16 negative. He has several concerning features on his pathology, including 1 positive node with extranodal extension and focal lymphovascular invasion. He was healing well, and then developed left facial pain and swelling, this occurred prior to initiating any chemoradiation. CT scan was nonspecific. His ENT surgeon recommended that we proceed with his postoperative treatment of adjuvant concurrent chemoradiation. He is scheduled for Carboplatin + Paclitaxel cycle 6 day 1 tomorrow.  Painful left facial swelling  We can see soft tissue swelling on the CT scan and thought that this represented parotiditis. We had him initially on 1 antibiotic then changed to Clindamycin but neither helped. We asked his ENT surgeon for an opinion and he did not seem to know what this was either. He recommended we proceed with treatment and so we have. This does not seem to be spread of his throat cancer but did not respond to antibiotics and is very erythematous and tender and fluctuates up and down. I recommended a referral to a tertiary care center ENT and we did an MRI.  Chronic Back Pain Spinal stenosis with 4 back surgeries including vertebral fusion. Followed by [facility] with recent L-spine MRI.    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 He had carotid angioplasty on the left and may eventually require it on the right. We do see some chronic infarctions in the pons and basal ganglia.   Tinnitus/Hearing Loss We may want to avoid cisplatin as it may worsen this. I previously referred him to an audiologist.   Tobacco Abuse He is motivated to quit and is  currently on Chantix and is down to one half pack per day.      Plan: He continues to have persistent left-sided facial swelling and pain which has shown minimal improvement since he started the Prednisone taper a few days ago. He resumed his chemotherapy and will receive Carboplatin + Paclitaxel cycle 6 day 1 tomorrow after XRT He will have stat labs on 12/11 and will complete chemo the following day. He is scheduled to see Dr. Avon Gully at [facility] on 03/11/22. We will follow up in 3-4 weeks with labs. I discussed the assessment and treatment plan with the patient and his wife.  The patient was provided an opportunity to ask questions and all were answered.  The patient agreed with the plan and demonstrated an understanding of the instructions.  The patient was advised to call back if the symptoms worsen or if the condition fails to improve as anticipated.   I provided 30 minutes of face-to-face time during this encounter and > 50% was spent counseling as documented under my assessment and plan.    [facility] [address] [place] [address] Dept: [phone] Dept Fax: [phone]   No orders of the defined types were placed in this encounter.   CHIEF COMPLAINT:  CC: Stage IVB oropharynx cancer  Current Treatment: Adjuvant chemoradiation with weekly carboplatin/paclitaxel  HISTORY OF PRESENT ILLNESS:   Oncology History  Oropharyngeal carcinoma ([place])  11/06/2021 Initial Diagnosis   Oropharyngeal carcinoma ([place])   11/06/2021 Cancer Staging  Staging form: Pharynx - P16 Negative Oropharynx, AJCC 8th Edition - Clinical stage from 11/06/2021: Stage IVB (cT1, cN3, cM0, p16-) - Signed by [name], MD on 12/28/2021 Histopathologic type: Squamous cell carcinoma, NOS Stage prefix: Initial diagnosis Histologic grade (G): G2 Histologic grading system: 4 grade system Tumor size (mm):  13 Lymph-vascular invasion (LVI): LVI present/identified, NOS Diagnostic confirmation: Positive histology Specimen type: Excision Staged by: Managing physician Presence of extranodal extension: Present Extent of extranodal extension: Microscopic ENE(+) Distance of extension from the native lymph node capsule to the farthest point of invasion in the extranodal tissue (mm): 6.5 ECOG performance status: Grade 1 Perineural invasion (PNI): Absent Prognostic indicators: 1.3 cm with 1/23 nodes positive -2.3 cm with ENE and focal LVI Stage used in treatment planning: Yes National guidelines used in treatment planning: Yes Type of national guideline used in treatment planning: NCCN   01/06/2022 -  Chemotherapy   Patient is on Treatment Plan : HEAD/NECK Carboplatin + Paclitaxel + XRT q7d     01/19/2022 Imaging   CT neck:  IMPRESSION:  1. 26 x 11 x 11 mm soft tissue mass posterior to the left  submandibular gland and anterior to the SCM. Some residual soft  tissue is noted in the PET scan. While this may represent  postoperative scar tissue, it is concerning for residual or  recurrent tumor.  2. Asymmetric soft tissue at the left glossal tonsillar sulcus with  focal surgical clips. No definite mass lesion is present.  3. Left carotid stent is in place. Vessel appears patent.  4. Atherosclerotic calcifications at the right carotid bifurcation  with what appears to be a high-grade proximal right ICA stenosis.  5. Right IJ Port-A-Cath in place.        INTERVAL HISTORY:  Ian Case is here today for repeat clinical assessment for Stage IVB oropharynx cancer. He is scheduled for Carboplatin + Paclitaxel cycle 6 day 1 tomorrow. He continues to have good days and bad days. He continues to have swelling and redness of the left side of his neck. He has been taking the Prednisone at '40mg'$  daily for a few days with minimal improvement. I have him on a slow taper. His most recent MRI scan reveals  swelling/inflammation and concluded this was post surgical changes, post treatment change. CT scan in October 2023 revealed soft tissue mass affect, but we really don't see any mass on the MRI. He has an appointment with Dr. Avon Gully at [facility] on 03/11/22. He denies fevers or chills. His appetite is good. His weight has increased 1 pound since his last visit.   REVIEW OF SYSTEMS:  Review of Systems  Constitutional:  Negative for appetite change, chills, fever and unexpected weight change.  HENT:   Negative for lump/mass, mouth sores and sore throat.        + left facial swelling  Eyes: Negative.   Respiratory: Negative.  Negative for chest tightness, cough, hemoptysis, shortness of breath and wheezing.   Cardiovascular: Negative.  Negative for chest pain, leg swelling and palpitations.  Gastrointestinal: Negative.  Negative for abdominal distention, abdominal pain, blood in stool, constipation, diarrhea, nausea and vomiting.  Endocrine: Negative.  Negative for hot flashes.  Genitourinary: Negative.  Negative for difficulty urinating, dysuria, frequency and hematuria.   Musculoskeletal:  Negative for arthralgias, back pain, flank pain and gait problem.  Skin: Negative.   Neurological: Negative.  Negative for dizziness, extremity weakness, gait problem, headaches, light-headedness, numbness, seizures and speech difficulty.  Hematological: Negative.  Negative for adenopathy. Does not bruise/bleed easily.  Psychiatric/Behavioral: Negative.  Negative for depression and sleep disturbance. The patient is not nervous/anxious.      VITALS:  There were no vitals taken for this visit.  Wt Readings from Last 3 Encounters:  03/04/22 246 lb 14.4 oz (112 kg)  02/22/22 245 lb 9.6 oz (111.4 kg)  02/18/22 242 lb (109.8 kg)    There is no height or weight on file to calculate BMI.  Performance status (ECOG): 1 - Symptomatic but completely ambulatory  PHYSICAL EXAM:  Physical Exam Vitals and  nursing note reviewed.  Constitutional:      General: He is not in acute distress.    Appearance: Normal appearance. He is normal weight. He is not ill-appearing or toxic-appearing.  HENT:     Head: Normocephalic and atraumatic.     Comments: Radiation changes of left neck with desquamation and hyperpigmentation. He still has erythematous swelling of left face which is tender even down into his clavicular area.    Mouth/Throat:     Mouth: Mucous membranes are moist.     Pharynx: Oropharynx is clear. No oropharyngeal exudate or posterior oropharyngeal erythema.  Eyes:     General: No scleral icterus.    Extraocular Movements: Extraocular movements intact.     Conjunctiva/sclera: Conjunctivae normal.     Pupils: Pupils are equal, round, and reactive to light.  Cardiovascular:     Rate and Rhythm: Normal rate and regular rhythm.     Pulses: Normal pulses.     Heart sounds: Normal heart sounds. No murmur heard.    No friction rub. No gallop.     Comments: Mild BLE edema. Pulmonary:     Effort: Pulmonary effort is normal. No respiratory distress.     Breath sounds: Wheezing present. No rhonchi or rales.     Comments: Scattered expiratory wheezes R>L. Abdominal:     General: Bowel sounds are normal. There is no distension.     Palpations: Abdomen is soft. There is no hepatomegaly, splenomegaly or mass.     Tenderness: There is no abdominal tenderness.  Musculoskeletal:        General: No swelling or tenderness. Normal range of motion.     Cervical back: Normal range of motion and neck supple. No tenderness.     Right lower leg: Edema present.     Left lower leg: Edema present.  Lymphadenopathy:     Cervical: No cervical adenopathy.     Upper Body:     Right upper body: No supraclavicular or axillary adenopathy.     Left upper body: No supraclavicular or axillary adenopathy.     Lower Body: No right inguinal adenopathy. No left inguinal adenopathy.  Skin:    General: Skin is warm  and dry.     Coloration: Skin is not jaundiced.     Findings: No rash.  Neurological:     Mental Status: He is alert and oriented to person, place, and time.     Cranial Nerves: No cranial nerve deficit.  Psychiatric:        Mood and Affect: Mood normal.        Behavior: Behavior normal.        Thought Content: Thought content normal.     LABS:      Latest Ref Rng & Units 02/16/2022    8:36 AM 02/09/2022   10:15 AM 02/02/2022   12:00 AM  CBC  WBC 4.0 - 10.5 K/uL 3.2  3.6  4.6      Hemoglobin 13.0 - 17.0 g/dL 13.1  14.0  13.7      Hematocrit 39.0 - 52.0 % 38.6  42.0  39      Platelets 150 - 400 K/uL 196  185  136         This result is from an external source.       Latest Ref Rng & Units 02/22/2022    3:00 PM 02/16/2022    8:36 AM 02/09/2022   10:15 AM  CMP  Glucose 70 - 99 mg/dL 78  90  98   BUN 6 - 20 mg/dL '25  17  16   '$ Creatinine 0.61 - 1.24 mg/dL 0.76  0.69  0.68   Sodium 135 - 145 mmol/L 138  143  142   Potassium 3.5 - 5.1 mmol/L 3.5  4.1  4.1   Chloride 98 - 111 mmol/L 99  108  103   CO2 22 - 32 mmol/L '31  30  25   '$ Calcium 8.9 - 10.3 mg/dL 9.0  9.4  9.6   Total Protein 6.5 - 8.1 g/dL 6.6  6.9  6.7   Total Bilirubin 0.3 - 1.2 mg/dL 0.4  0.5  0.5   Alkaline Phos 38 - 126 U/L 57  64  54   AST 15 - 41 U/L '16  22  22   '$ ALT 0 - 44 U/L '18  25  25      '$ No results found for: "CEA1", "CEA" / No results found for: "CEA1", "CEA" No results found for: "PSA1" No results found for: "VVO160" No results found for: "CAN125"  No results found for: "TOTALPROTELP", "ALBUMINELP", "A1GS", "A2GS", "BETS", "BETA2SER", "GAMS", "MSPIKE", "SPEI" No results found for: "TIBC", "FERRITIN", "IRONPCTSAT" No results found for: "LDH"  STUDIES:   MR ORBITS W WO CONTRAST Result Date: 03/02/2022 CLINICAL DATA:  Patient has a history of oropharyngeal carcinoma status post chemotherapy and surgical resection (hemiglossectomy and tonsillectomy). EXAM: MRI OF THE NECK WITH CONTRAST MRI OF  THE NECK WITHOUT AND WITH CONTRAST TECHNIQUE: Multiplanar, multisequence MR imaging was performed following the administration of intravenous contrast. Multiplanar, multi-echo pulse sequences of the orbits and surrounding structures were acquired including fat saturation techniques, before and after intravenous contrast administration. CONTRAST:  11m GADAVIST GADOBUTROL 1 MMOL/ML IV SOLN COMPARISON:  CT Neck 01/19/22 FINDINGS: ORBITS FINDINGS: Orbits:  Right-sided lens replacement. Visualized sinuses: Air-fluid level in the right sphenoid sinus. Mild mucosal thickening bilateral ethmoid sinuses. Limited intracranial: Chronic infarct in the central pons (neck series 7, image 1). Chronic infarcts in the bilateral basal ganglia. There is also a chronic infarct in the left parietal lobe. NECK FINDINGS: Pharynx and larynx: Patient is status post left hemiglossectomy and tonsillectomy. There is asymmetric contrast enhancement along the left aspect of the tongue (orbits series 14, image 1). There is asymmetric T2 hyperintense signal in the in the parapharyngeal space on the left (series 7, image 16) and in the soft tissues overlying the left sternocleidomastoid muscle body (series 7, image 23). T2 hyperintense signal is also seen in the soft tissues around the angle of the mandible on the left (series 7, image 18/series 6, image 28), particularly along the posterior and inferior aspect of the mylohyoid and the medial pterygoid, respectively. Asymmetric T2 hyperintense signal is also present within the lateral pterygoid muscle body on the left (series 7, image 7). No discrete soft tissue mass is visualized in the region of the left submandibular gland the correlate  for the finding seen on prior CT of the neck dated 01/19/2022, particularly along the anterior superior margin of the left SCM. No abnormal lymphadenopathy seen in this region. Mild asymmetric contrast enhancement and T2 hyperintense signal is also seen within  the paraspinal musculature on the left (series 7, image 40). Salivary glands: There is mild asymmetric T2 hyperintense signal along the lateral and posterior margins of the left submandibular gland. The left parotid gland is normal in appearance. The intraparotid facial nerve side demonstrate asymmetric contrast enhancement to suggest perineural spread of tumor. Thyroid: Normal Lymph nodes: No suspicious lymphadenopathy. Vascular: Normal flow voids. Mastoids and visualized paranasal sinuses: Bilateral maxillary sinuses are clear. Trace left mastoid effusion. Skeleton: No evidence of high-grade spinal canal stenosis. No suspicious lesions visualized. Upper chest: Negative Other: None IMPRESSION: 1. Postsurgical changes of left hemiglossectomy, tonsillectomy, and likely left neck dissection. There is ill defined T2 hyperintense signal in the region of the left submandibular gland at the area of abnormality seen on recent prior CT neck, but without a discrete soft tissue mass. There is also asymmetric T2 hyperintense signal abnormality and contrast enhancement in the left parapharyngeal space, surrounding the medial pterygoid and masseter muscle bodies, and along the posterior margin of the mylohyoid on the left. These findings are favored to represent evolving post-surgical changes, but close follow up is recommended to ensure continued resolution of signal abnormality and soft tissue mass seen on recent CT. 2. There is asymmetric contrast enhancement along the left aspect of the tongue. Findings are favored to represent post treatment change, with residual tumor not entirely excluded. Correlate with direct visualization. 3. Mild asymmetric T2 hyperintense signal and contrast enhancement within the paraspinal musculature on the left, nonspecific, possibly post-traumatic. 4. Chronic infarcts in the central pons, bilateral basal ganglia, and left parietal lobe. 5. Air-fluid level in the right sphenoid sinus. Correlate  for symptoms of acute sinusitis. Electronically Signed   By: Marin Roberts M.D.   On: 03/03/2022 09:30   MR NECK SOFT TISSUE ONLY W WO CONTRAST Result Date: 03/02/2022 CLINICAL DATA:  Patient has a history of oropharyngeal carcinoma status post chemotherapy and surgical resection (hemiglossectomy and tonsillectomy). EXAM: MRI OF THE NECK WITH CONTRAST MRI OF THE NECK WITHOUT AND WITH CONTRAST TECHNIQUE: Multiplanar, multisequence MR imaging was performed following the administration of intravenous contrast. Multiplanar, multi-echo pulse sequences of the orbits and surrounding structures were acquired including fat saturation techniques, before and after intravenous contrast administration. CONTRAST:  71m GADAVIST GADOBUTROL 1 MMOL/ML IV SOLN COMPARISON:  CT Neck 01/19/22 FINDINGS: ORBITS FINDINGS: Orbits:  Right-sided lens replacement. Visualized sinuses: Air-fluid level in the right sphenoid sinus. Mild mucosal thickening bilateral ethmoid sinuses. Limited intracranial: Chronic infarct in the central pons (neck series 7, image 1). Chronic infarcts in the bilateral basal ganglia. There is also a chronic infarct in the left parietal lobe. NECK FINDINGS: Pharynx and larynx: Patient is status post left hemiglossectomy and tonsillectomy. There is asymmetric contrast enhancement along the left aspect of the tongue (orbits series 14, image 1). There is asymmetric T2 hyperintense signal in the in the parapharyngeal space on the left (series 7, image 16) and in the soft tissues overlying the left sternocleidomastoid muscle body (series 7, image 23). T2 hyperintense signal is also seen in the soft tissues around the angle of the mandible on the left (series 7, image 18/series 6, image 28), particularly along the posterior and inferior aspect of the mylohyoid and the medial pterygoid, respectively. Asymmetric T2 hyperintense signal  is also present within the lateral pterygoid muscle body on the left (series 7, image 7).  No discrete soft tissue mass is visualized in the region of the left submandibular gland the correlate for the finding seen on prior CT of the neck dated 01/19/2022, particularly along the anterior superior margin of the left SCM. No abnormal lymphadenopathy seen in this region. Mild asymmetric contrast enhancement and T2 hyperintense signal is also seen within the paraspinal musculature on the left (series 7, image 40). Salivary glands: There is mild asymmetric T2 hyperintense signal along the lateral and posterior margins of the left submandibular gland. The left parotid gland is normal in appearance. The intraparotid facial nerve side demonstrate asymmetric contrast enhancement to suggest perineural spread of tumor. Thyroid: Normal Lymph nodes: No suspicious lymphadenopathy. Vascular: Normal flow voids. Mastoids and visualized paranasal sinuses: Bilateral maxillary sinuses are clear. Trace left mastoid effusion. Skeleton: No evidence of high-grade spinal canal stenosis. No suspicious lesions visualized. Upper chest: Negative Other: None IMPRESSION: 1. Postsurgical changes of left hemiglossectomy, tonsillectomy, and likely left neck dissection. There is ill defined T2 hyperintense signal in the region of the left submandibular gland at the area of abnormality seen on recent prior CT neck, but without a discrete soft tissue mass. There is also asymmetric T2 hyperintense signal abnormality and contrast enhancement in the left parapharyngeal space, surrounding the medial pterygoid and masseter muscle bodies, and along the posterior margin of the mylohyoid on the left. These findings are favored to represent evolving post-surgical changes, but close follow up is recommended to ensure continued resolution of signal abnormality and soft tissue mass seen on recent CT. 2. There is asymmetric contrast enhancement along the left aspect of the tongue. Findings are favored to represent post treatment change, with residual  tumor not entirely excluded. Correlate with direct visualization. 3. Mild asymmetric T2 hyperintense signal and contrast enhancement within the paraspinal musculature on the left, nonspecific, possibly post-traumatic. 4. Chronic infarcts in the central pons, bilateral basal ganglia, and left parietal lobe. 5. Air-fluid level in the right sphenoid sinus. Correlate for symptoms of acute sinusitis. Electronically Signed   By: Marin Roberts M.D.   On: 03/03/2022 09:30     CT soft tissue neck 02/03/2022    NM PET skull base to mid thigh 01/13/2022     Chest Xray 1 view 12/22/2021    HISTORY:   Past Medical History:  Diagnosis Date   Arthritis    Back pain    GERD (gastroesophageal reflux disease)    History of kidney stones    Hypertension    Sleep apnea    does not have cpap yet    Past Surgical History:  Procedure Laterality Date   APPENDECTOMY     HAND SURGERY     LUMBAR LAMINECTOMY/DECOMPRESSION MICRODISCECTOMY N/A 12/28/2017   Procedure: Laminectomy and Foraminotomy Lumbar two-three-Lumbar three-four - Lumbar four-five;  Surgeon: Kary Kos, MD;  Location: [place] [facility];  Service: Neurosurgery;  Laterality: N/A;   OTHER SURGICAL HISTORY     removal on cancer from neck    Family History  Problem Relation Age of Onset   Thyroid disease Mother    Heart disease Father    Prostate cancer Brother    Healthy Daughter    Healthy Son     Social History:  reports that he has been smoking cigarettes. He has a 22.00 pack-year smoking history. He has never used smokeless tobacco. He reports that he does not currently use alcohol. He reports that  he does not use drugs.The patient is accompanied by his wife today.  Allergies: No Known Allergies  Current Medications: Current Outpatient Medications  Medication Sig Dispense Refill   amitriptyline (ELAVIL) 75 MG tablet Take 1 tablet by mouth daily.     amLODipine (NORVASC) 10 MG tablet Take 1 tablet by mouth every morning.     aspirin EC  81 MG tablet Take by mouth.     atorvastatin (LIPITOR) 80 MG tablet Take 80 mg by mouth daily.     chlorhexidine (PERIDEX) 0.12 % solution SMARTSIG:1 Capful(s) By Mouth Twice Daily     cyanocobalamin (VITAMIN B12) 1000 MCG/ML injection Inject 1 mL every month by subcutaneous route for 1 day.     dexamethasone 0.5 MG/5ML elixir Take by mouth.     fenofibrate 54 MG tablet TAKE ONE TABLET BY MOUTH EVERY DAY FOR 30 DAYS     gabapentin (NEURONTIN) 800 MG tablet Take 800 mg by mouth 4 (four) times daily.     HYDROcodone-acetaminophen (NORCO) 10-325 MG tablet Take by mouth.     ibuprofen (ADVIL) 800 MG tablet Take by mouth.     ketorolac (TORADOL) 10 MG tablet Take 10 mg by mouth every 6 (six) hours as needed.     lidocaine (XYLOCAINE) 2 % solution SMARTSIG:15 Milliliter(s) By Mouth Every 3 Hours PRN     magic mouthwash SOLN Take 15 mLs by mouth in the morning, at noon, in the evening, and at bedtime.     nitroGLYCERIN (NITROSTAT) 0.4 MG SL tablet PLACE 1 TABLET UNDER THE TONGUE EVERY 5 MIN FOR CHEST PAIN AS NEEDED. NO MORE THAN 3 TABS IN 15 MIN.     nystatin (MYCOSTATIN) 100000 UNIT/ML suspension Swish and swallow 4 (four) times daily     ondansetron (ZOFRAN) 8 MG tablet Take 1 tablet (8 mg total) by mouth every 8 (eight) hours as needed for nausea or vomiting. Start on the third day after chemotherapy. 30 tablet 1   oxyCODONE-acetaminophen (PERCOCET/ROXICET) 5-325 MG tablet Take 1-2 tablets by mouth every 4 (four) hours as needed for severe pain. (Patient not taking: Reported on 01/18/2022) 15 tablet 0   pantoprazole (PROTONIX) 40 MG tablet Take 1 tablet by mouth daily.     predniSONE (DELTASONE) 10 MG tablet Take 4 tablets (40 mg total) by mouth daily with breakfast. Take 4 tablets by mouth daily for 7 days, then 3 tablets by mouth daily for 7 days, then 2 tablets by mouth daily for 7 days and then 1 tablet by mouth daily 70 tablet 0   prochlorperazine (COMPAZINE) 10 MG tablet Take 1 tablet (10 mg  total) by mouth every 6 (six) hours as needed for nausea or vomiting. 30 tablet 1   senna-docusate (SENOKOT-S) 8.6-50 MG tablet Take by mouth.     tiZANidine (ZANAFLEX) 4 MG tablet Take by mouth.     triamterene-hydrochlorothiazide (MAXZIDE-25) 37.5-25 MG tablet Take 1 tablet by mouth daily.     No current facility-administered medications for this visit.    I,Alexis Herring,acting as a scribe for [name], MD.,have documented all relevant documentation on the behalf of [name], MD,as directed by  [name], MD while in the presence of [name], MD.  I have reviewed this report as typed by the medical scribe, and it is complete and accurate.  Alexis Herring   03/08/22 1:35 PM

## 2022-03-09 ENCOUNTER — Other Ambulatory Visit: Payer: Self-pay

## 2022-03-09 ENCOUNTER — Inpatient Hospital Stay: Payer: Medicaid Other

## 2022-03-09 VITALS — BP 128/67 | HR 71 | Temp 97.4°F | Resp 16 | Wt 245.0 lb

## 2022-03-09 DIAGNOSIS — Z5111 Encounter for antineoplastic chemotherapy: Secondary | ICD-10-CM | POA: Diagnosis not present

## 2022-03-09 DIAGNOSIS — C109 Malignant neoplasm of oropharynx, unspecified: Secondary | ICD-10-CM

## 2022-03-09 MED ORDER — HEPARIN SOD (PORK) LOCK FLUSH 100 UNIT/ML IV SOLN
500.0000 [IU] | Freq: Once | INTRAVENOUS | Status: AC | PRN
  Administered 2022-03-09: 500 [IU]

## 2022-03-09 MED ORDER — SODIUM CHLORIDE 0.9% FLUSH
10.0000 mL | INTRAVENOUS | Status: DC | PRN
  Administered 2022-03-09: 10 mL

## 2022-03-09 MED ORDER — PALONOSETRON HCL INJECTION 0.25 MG/5ML
0.2500 mg | Freq: Once | INTRAVENOUS | Status: AC
  Administered 2022-03-09: 0.25 mg via INTRAVENOUS
  Filled 2022-03-09: qty 5

## 2022-03-09 MED ORDER — SODIUM CHLORIDE 0.9 % IV SOLN
300.0000 mg | Freq: Once | INTRAVENOUS | Status: AC
  Administered 2022-03-09: 300 mg via INTRAVENOUS
  Filled 2022-03-09 (×2): qty 30

## 2022-03-09 MED ORDER — SODIUM CHLORIDE 0.9 % IV SOLN
45.0000 mg/m2 | Freq: Once | INTRAVENOUS | Status: AC
  Administered 2022-03-09: 102 mg via INTRAVENOUS
  Filled 2022-03-09 (×2): qty 17

## 2022-03-09 MED ORDER — SODIUM CHLORIDE 0.9 % IV SOLN: Freq: Once | INTRAVENOUS | Status: AC

## 2022-03-09 MED ORDER — SODIUM CHLORIDE 0.9 % IV SOLN
10.0000 mg | Freq: Once | INTRAVENOUS | Status: AC
  Administered 2022-03-09: 10 mg via INTRAVENOUS
  Filled 2022-03-09 (×2): qty 1

## 2022-03-09 MED ORDER — FAMOTIDINE IN NACL 20-0.9 MG/50ML-% IV SOLN
20.0000 mg | Freq: Once | INTRAVENOUS | Status: AC
  Administered 2022-03-09: 20 mg via INTRAVENOUS
  Filled 2022-03-09: qty 50

## 2022-03-09 MED ORDER — DIPHENHYDRAMINE HCL 50 MG/ML IJ SOLN
25.0000 mg | Freq: Once | INTRAMUSCULAR | Status: AC
  Administered 2022-03-09: 25 mg via INTRAVENOUS
  Filled 2022-03-09: qty 1

## 2022-03-09 NOTE — Patient Instructions (Signed)
Carboplatin Injection What is this medication? CARBOPLATIN (KAR boe pla tin) treats some types of cancer. It works by slowing down the growth of cancer cells. This medicine may be used for other purposes; ask your health care provider or pharmacist if you have questions. COMMON BRAND NAME(S): Paraplatin What should I tell my care team before I take this medication? They need to know if you have any of these conditions: Blood disorders Hearing problems Kidney disease Recent or ongoing radiation therapy An unusual or allergic reaction to carboplatin, cisplatin, other medications, foods, dyes, or preservatives Pregnant or trying to get pregnant Breast-feeding How should I use this medication? This medication is injected into a vein. It is given by your care team in a hospital or clinic setting. Talk to your care team about the use of this medication in children. Special care may be needed. Overdosage: If you think you have taken too much of this medicine contact a poison control center or emergency room at once. NOTE: This medicine is only for you. Do not share this medicine with others. What if I miss a dose? Keep appointments for follow-up doses. It is important not to miss your dose. Call your care team if you are unable to keep an appointment. What may interact with this medication? Medications for seizures Some antibiotics, such as amikacin, gentamicin, neomycin, streptomycin, tobramycin Vaccines This list may not describe all possible interactions. Give your health care provider a list of all the medicines, herbs, non-prescription drugs, or dietary supplements you use. Also tell them if you smoke, drink alcohol, or use illegal drugs. Some items may interact with your medicine. What should I watch for while using this medication? Your condition will be monitored carefully while you are receiving this medication. You may need blood work while taking this medication. This medication may  make you feel generally unwell. This is not uncommon, as chemotherapy can affect healthy cells as well as cancer cells. Report any side effects. Continue your course of treatment even though you feel ill unless your care team tells you to stop. In some cases, you may be given additional medications to help with side effects. Follow all directions for their use. This medication may increase your risk of getting an infection. Call your care team for advice if you get a fever, chills, sore throat, or other symptoms of a cold or flu. Do not treat yourself. Try to avoid being around people who are sick. Avoid taking medications that contain aspirin, acetaminophen, ibuprofen, naproxen, or ketoprofen unless instructed by your care team. These medications may hide a fever. Be careful brushing or flossing your teeth or using a toothpick because you may get an infection or bleed more easily. If you have any dental work done, tell your dentist you are receiving this medication. Talk to your care team if you wish to become pregnant or think you might be pregnant. This medication can cause serious birth defects. Talk to your care team about effective forms of contraception. Do not breast-feed while taking this medication. What side effects may I notice from receiving this medication? Side effects that you should report to your care team as soon as possible: Allergic reactions--skin rash, itching, hives, swelling of the face, lips, tongue, or throat Infection--fever, chills, cough, sore throat, wounds that don't heal, pain or trouble when passing urine, general feeling of discomfort or being unwell Low red blood cell level--unusual weakness or fatigue, dizziness, headache, trouble breathing Pain, tingling, or numbness in the hands or   feet, muscle weakness, change in vision, confusion or trouble speaking, loss of balance or coordination, trouble walking, seizures Unusual bruising or bleeding Side effects that usually  do not require medical attention (report to your care team if they continue or are bothersome): Hair loss Nausea Unusual weakness or fatigue Vomiting This list may not describe all possible side effects. Call your doctor for medical advice about side effects. You may report side effects to FDA at 1-800-FDA-1088. Where should I keep my medication? This medication is given in a hospital or clinic. It will not be stored at home. NOTE: This sheet is a summary. It may not cover all possible information. If you have questions about this medicine, talk to your doctor, pharmacist, or health care provider.  2023 Elsevier/Gold Standard (2021-07-06 00:00:00) Paclitaxel Injection What is this medication? PACLITAXEL (PAK li TAX el) treats some types of cancer. It works by slowing down the growth of cancer cells. This medicine may be used for other purposes; ask your health care provider or pharmacist if you have questions. COMMON BRAND NAME(S): Onxol, Taxol What should I tell my care team before I take this medication? They need to know if you have any of these conditions: Heart disease Liver disease Low white blood cell levels An unusual or allergic reaction to paclitaxel, other medications, foods, dyes, or preservatives If you or your partner are pregnant or trying to get pregnant Breast-feeding How should I use this medication? This medication is injected into a vein. It is given by your care team in a hospital or clinic setting. Talk to your care team about the use of this medication in children. While it may be given to children for selected conditions, precautions do apply. Overdosage: If you think you have taken too much of this medicine contact a poison control center or emergency room at once. NOTE: This medicine is only for you. Do not share this medicine with others. What if I miss a dose? Keep appointments for follow-up doses. It is important not to miss your dose. Call your care team if  you are unable to keep an appointment. What may interact with this medication? Do not take this medication with any of the following: Live virus vaccines Other medications may affect the way this medication works. Talk with your care team about all of the medications you take. They may suggest changes to your treatment plan to lower the risk of side effects and to make sure your medications work as intended. This list may not describe all possible interactions. Give your health care provider a list of all the medicines, herbs, non-prescription drugs, or dietary supplements you use. Also tell them if you smoke, drink alcohol, or use illegal drugs. Some items may interact with your medicine. What should I watch for while using this medication? Your condition will be monitored carefully while you are receiving this medication. You may need blood work while taking this medication. This medication may make you feel generally unwell. This is not uncommon as chemotherapy can affect healthy cells as well as cancer cells. Report any side effects. Continue your course of treatment even though you feel ill unless your care team tells you to stop. This medication can cause serious allergic reactions. To reduce the risk, your care team may give you other medications to take before receiving this one. Be sure to follow the directions from your care team. This medication may increase your risk of getting an infection. Call your care team for advice if   you get a fever, chills, sore throat, or other symptoms of a cold or flu. Do not treat yourself. Try to avoid being around people who are sick. This medication may increase your risk to bruise or bleed. Call your care team if you notice any unusual bleeding. Be careful brushing or flossing your teeth or using a toothpick because you may get an infection or bleed more easily. If you have any dental work done, tell your dentist you are receiving this medication. Talk to  your care team if you may be pregnant. Serious birth defects can occur if you take this medication during pregnancy. Talk to your care team before breastfeeding. Changes to your treatment plan may be needed. What side effects may I notice from receiving this medication? Side effects that you should report to your care team as soon as possible: Allergic reactions--skin rash, itching, hives, swelling of the face, lips, tongue, or throat Heart rhythm changes--fast or irregular heartbeat, dizziness, feeling faint or lightheaded, chest pain, trouble breathing Increase in blood pressure Infection--fever, chills, cough, sore throat, wounds that don't heal, pain or trouble when passing urine, general feeling of discomfort or being unwell Low blood pressure--dizziness, feeling faint or lightheaded, blurry vision Low red blood cell level--unusual weakness or fatigue, dizziness, headache, trouble breathing Painful swelling, warmth, or redness of the skin, blisters or sores at the infusion site Pain, tingling, or numbness in the hands or feet Slow heartbeat--dizziness, feeling faint or lightheaded, confusion, trouble breathing, unusual weakness or fatigue Unusual bruising or bleeding Side effects that usually do not require medical attention (report to your care team if they continue or are bothersome): Diarrhea Hair loss Joint pain Loss of appetite Muscle pain Nausea Vomiting This list may not describe all possible side effects. Call your doctor for medical advice about side effects. You may report side effects to FDA at 1-800-FDA-1088. Where should I keep my medication? This medication is given in a hospital or clinic. It will not be stored at home. NOTE: This sheet is a summary. It may not cover all possible information. If you have questions about this medicine, talk to your doctor, pharmacist, or health care provider.  2023 Elsevier/Gold Standard (2021-07-22 00:00:00)  

## 2022-03-12 ENCOUNTER — Other Ambulatory Visit: Payer: Self-pay

## 2022-03-15 ENCOUNTER — Inpatient Hospital Stay: Payer: Medicaid Other

## 2022-03-15 DIAGNOSIS — C109 Malignant neoplasm of oropharynx, unspecified: Secondary | ICD-10-CM

## 2022-03-15 LAB — COMPREHENSIVE METABOLIC PANEL
Albumin: 3.7 (ref 3.5–5.0)
Calcium: 9.3 (ref 8.7–10.7)

## 2022-03-15 LAB — HEPATIC FUNCTION PANEL
ALT: 19 U/L (ref 10–40)
AST: 19 (ref 14–40)
Alkaline Phosphatase: 72 (ref 25–125)
Bilirubin, Total: 0.3

## 2022-03-15 LAB — BASIC METABOLIC PANEL
BUN: 23 — AB (ref 4–21)
CO2: 28 — AB (ref 13–22)
Chloride: 99 (ref 99–108)
Creatinine: 0.8 (ref 0.6–1.3)
Glucose: 87
Potassium: 4 mEq/L (ref 3.5–5.1)
Sodium: 134 — AB (ref 137–147)

## 2022-03-15 LAB — CBC: RBC: 3.71 — AB (ref 3.87–5.11)

## 2022-03-15 LAB — CBC AND DIFFERENTIAL
HCT: 36 — AB (ref 41–53)
Hemoglobin: 12.7 — AB (ref 13.5–17.5)
Neutrophils Absolute: 2.02
Platelets: 163 10*3/uL (ref 150–400)
WBC: 3.6

## 2022-03-15 MED FILL — Paclitaxel IV Conc 300 MG/50ML (6 MG/ML): INTRAVENOUS | Qty: 17 | Status: AC

## 2022-03-15 MED FILL — Carboplatin IV Soln 450 MG/45ML: INTRAVENOUS | Qty: 30 | Status: AC

## 2022-03-15 MED FILL — Dexamethasone Sodium Phosphate Inj 100 MG/10ML: INTRAMUSCULAR | Qty: 1 | Status: AC

## 2022-03-16 ENCOUNTER — Inpatient Hospital Stay: Payer: Medicaid Other

## 2022-03-16 VITALS — BP 159/78 | HR 76 | Temp 97.9°F | Resp 16 | Wt 241.1 lb

## 2022-03-16 DIAGNOSIS — Z5111 Encounter for antineoplastic chemotherapy: Secondary | ICD-10-CM | POA: Diagnosis not present

## 2022-03-16 DIAGNOSIS — C109 Malignant neoplasm of oropharynx, unspecified: Secondary | ICD-10-CM

## 2022-03-16 MED ORDER — HEPARIN SOD (PORK) LOCK FLUSH 100 UNIT/ML IV SOLN
500.0000 [IU] | Freq: Once | INTRAVENOUS | Status: AC | PRN
  Administered 2022-03-16: 500 [IU]

## 2022-03-16 MED ORDER — DIPHENHYDRAMINE HCL 50 MG/ML IJ SOLN
25.0000 mg | Freq: Once | INTRAMUSCULAR | Status: AC
  Administered 2022-03-16: 25 mg via INTRAVENOUS
  Filled 2022-03-16: qty 1

## 2022-03-16 MED ORDER — SODIUM CHLORIDE 0.9 % IV SOLN
10.0000 mg | Freq: Once | INTRAVENOUS | Status: AC
  Administered 2022-03-16: 10 mg via INTRAVENOUS
  Filled 2022-03-16: qty 10

## 2022-03-16 MED ORDER — SODIUM CHLORIDE 0.9% FLUSH
10.0000 mL | INTRAVENOUS | Status: DC | PRN
  Administered 2022-03-16: 10 mL

## 2022-03-16 MED ORDER — SODIUM CHLORIDE 0.9 % IV SOLN: Freq: Once | INTRAVENOUS | Status: AC

## 2022-03-16 MED ORDER — SODIUM CHLORIDE 0.9 % IV SOLN
45.0000 mg/m2 | Freq: Once | INTRAVENOUS | Status: AC
  Administered 2022-03-16: 102 mg via INTRAVENOUS
  Filled 2022-03-16: qty 17

## 2022-03-16 MED ORDER — PALONOSETRON HCL INJECTION 0.25 MG/5ML
0.2500 mg | Freq: Once | INTRAVENOUS | Status: AC
  Administered 2022-03-16: 0.25 mg via INTRAVENOUS
  Filled 2022-03-16: qty 5

## 2022-03-16 MED ORDER — FAMOTIDINE IN NACL 20-0.9 MG/50ML-% IV SOLN
20.0000 mg | Freq: Once | INTRAVENOUS | Status: AC
  Administered 2022-03-16: 20 mg via INTRAVENOUS
  Filled 2022-03-16: qty 50

## 2022-03-16 MED ORDER — SODIUM CHLORIDE 0.9 % IV SOLN
300.0000 mg | Freq: Once | INTRAVENOUS | Status: AC
  Administered 2022-03-16: 300 mg via INTRAVENOUS
  Filled 2022-03-16: qty 30

## 2022-03-19 NOTE — Progress Notes (Signed)
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 This is p16 negative. He has several concerning features on his pathology, including 1 positive node with extranodal extension and focal lymphovascular invasion. He was healing well, and then developed left facial pain and swelling, this occurred prior to initiating any chemoradiation. CT scan was nonspecific. MRI scan did not show any mass affect and was consistent with inflammation of the area. He has now completed his full course of radiation along with 7 cycles of chemotherapy with carboplatin and taxol as of December.   Painful left facial swelling  We can see soft tissue swelling on the CT scan and originally thought that this represented parotiditis. We had him initially on 1 antibiotic then changed to Clindamycin but neither helped. We asked his ENT surgeon for an opinion and he did not seem to know what this was either. He recommended we proceed with treatment and so we have. This does not seem to be spread of his throat cancer but did not respond to antibiotics and is very erythematous and extremely tender, and fluctuates up and down. I recommended a referral to a tertiary care center ENT and he was seen by Dr. Avon Gully at [facility].  He feels this represents postoperative changes that are just more pronounced than usual.  Chronic Back Pain Spinal stenosis with 4 back surgeries including vertebral fusion.  He is followed by [facility] with plans for L-spine MRI next week.   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 He had carotid angioplasty on the left and may eventually require it on the right. We do see some chronic infarctions in the pons  and basal ganglia.   Tinnitus/Hearing Loss We may want to avoid cisplatin as it may worsen this. I previously referred him to an audiologist.   Tobacco Abuse He is motivated to quit and is currently on Chantix and is down to one half pack per day.      Plan  He continues to have persistent left-sided facial swelling and pain but this has improved somewhat. I did give him a course of steroids. I have tried to be clear that these are two separate issues and the left facial pain and swelling started well before the start of chemoradiation. We had reviewed the indications for his chemoradiation with the patient and his mother at his last visit, and he completed the full 7 cycles of Carboplatin and Taxol. My conclusion is this represents post surgical effects, and that seems to be what Dr. Avon Gully has concluded as well. He has now completed his radiation as of December, 2023.  He is still suffering expected toxicities but notes some improvement. He will return in 6 weeks with repeat CBC and CMP, if all is well we will plan a CT scan of neck and chest 3 months later. The patient was provided an opportunity to ask questions and all were answered.  The patient and wife agreed with the plan and demonstrated an understanding of the instructions.  The patient was advised to call back if the symptoms worsen or if the condition fails to improve as anticipated.  I provided 30 minutes of face-to-face time during this encounter and > 50% was spent  counseling as documented under my assessment and plan.    [name], MD  [place] [address] [place] [address] Dept: [phone] Dept Fax: [phone]   No orders of the defined types were placed in this encounter.   CHIEF COMPLAINT:  CC: Stage IVB oropharynx cancer  Current Treatment: Adjuvant chemoradiation with weekly carboplatin/paclitaxel  HISTORY OF PRESENT ILLNESS:    Oncology History  Oropharyngeal carcinoma ([facility])  11/06/2021 Initial Diagnosis   Oropharyngeal carcinoma ([place])   11/06/2021 Cancer Staging   Staging form: Pharynx - P16 Negative Oropharynx, AJCC 8th Edition - Clinical stage from 11/06/2021: Stage IVB (cT1, cN3, cM0, p16-) - Signed by [name], MD on 12/28/2021 Histopathologic type: Squamous cell carcinoma, NOS Stage prefix: Initial diagnosis Histologic grade (G): G2 Histologic grading system: 4 grade system Tumor size (mm): 13 Lymph-vascular invasion (LVI): LVI present/identified, NOS Diagnostic confirmation: Positive histology Specimen type: Excision Staged by: Managing physician Presence of extranodal extension: Present Extent of extranodal extension: Microscopic ENE(+) Distance of extension from the native lymph node capsule to the farthest point of invasion in the extranodal tissue (mm): 6.5 ECOG performance status: Grade 1 Perineural invasion (PNI): Absent Prognostic indicators: 1.3 cm with 1/23 nodes positive -2.3 cm with ENE and focal LVI Stage used in treatment planning: Yes National guidelines used in treatment planning: Yes Type of national guideline used in treatment planning: NCCN   01/06/2022 - 03/16/2022 Chemotherapy   Patient is on Treatment Plan : HEAD/NECK Carboplatin + Paclitaxel + XRT q7d     01/19/2022 Imaging   CT neck:  IMPRESSION:  1. 26 x 11 x 11 mm soft tissue mass posterior to the left  submandibular gland and anterior to the SCM. Some residual soft  tissue is noted in the PET scan. While this may represent  postoperative scar tissue, it is concerning for residual or  recurrent tumor.  2. Asymmetric soft tissue at the left glossal tonsillar sulcus with  focal surgical clips. No definite mass lesion is present.  3. Left carotid stent is in place. Vessel appears patent.  4. Atherosclerotic calcifications at the right carotid bifurcation  with what appears to be a high-grade proximal right ICA  stenosis.  5. Right IJ Port-A-Cath in place.        INTERVAL HISTORY:  Ian Case is here today for repeat clinical assessment for Stage IVB oropharynx cancer.  He states that his jaw and mouth still have soreness and tenderness. He does want his port removed and he will be scheduled on 04/08/22 to see Dr. Coralie Keens to remove his port and feeding tube. Patient states he was extremely fatigued for the past 4 days but is feeling better now. I will renew his prescription for magic mouthwash and I suggested he have scans done every 6 months. He had a recent MRI scan so we can wait until the spring. He was evaluated by Dr. Avon Gully at [facility] and Neck clinic, who concurred with our conclusions and treatment plan. He denies signs of infection such as sinus drainage, cough, or urinary symptoms.  He denies fevers or recurrent chills. He denies pain. He denies nausea, vomiting, chest pain, dyspnea or cough. His weight has increased 5 pounds over last month .  REVIEW OF SYSTEMS:  Review of Systems  Constitutional:  Negative for appetite change, chills, fever and unexpected weight change.  HENT:   Positive for mouth sores and trouble swallowing. Negative for lump/mass and  sore throat.        + left ear pain intermittently + left jaw pain + left facial swelling  Eyes: Negative.   Respiratory: Negative.  Negative for chest tightness, cough, hemoptysis, shortness of breath and wheezing.   Cardiovascular: Negative.  Negative for chest pain, leg swelling and palpitations.  Gastrointestinal: Negative.  Negative for abdominal distention, abdominal pain, blood in stool, constipation, diarrhea, nausea and vomiting.  Endocrine: Negative.  Negative for hot flashes.  Genitourinary: Negative.  Negative for difficulty urinating, dysuria, frequency and hematuria.   Musculoskeletal:  Positive for arthralgias and myalgias. Negative for back pain, flank pain and gait problem.  Skin: Negative.   Neurological:  Negative.  Negative for dizziness, extremity weakness, gait problem, headaches, light-headedness, numbness, seizures and speech difficulty.  Hematological: Negative.  Negative for adenopathy. Does not bruise/bleed easily.  Psychiatric/Behavioral: Negative.  Negative for depression and sleep disturbance. The patient is not nervous/anxious.      VITALS:  Blood pressure 125/79, pulse 75, temperature 98.4 F (36.9 C), temperature source Oral, resp. rate 18, height '5\' 9"'$  (1.753 m), weight 250 lb (113.4 kg), SpO2 100 %.  Wt Readings from Last 3 Encounters:  03/31/22 250 lb (113.4 kg)  03/16/22 241 lb 1.9 oz (109.4 kg)  03/09/22 245 lb (111.1 kg)    Body mass index is 36.92 kg/m.  Performance status (ECOG): 1 - Symptomatic but completely ambulatory  PHYSICAL EXAM:  Physical Exam Vitals and nursing note reviewed.  Constitutional:      General: He is not in acute distress.    Appearance: Normal appearance. He is normal weight. He is not ill-appearing or toxic-appearing.  HENT:     Head: Normocephalic and atraumatic.     Comments: Large scar left neck. Swelling and erythema of left face which is mildly improved. Mild edema of bilateral neck areas but these are not tender or erythematous.    Nose: Nose normal.     Mouth/Throat:     Mouth: Mucous membranes are moist.     Pharynx: Oropharynx is clear. No oropharyngeal exudate or posterior oropharyngeal erythema.  Eyes:     General: No scleral icterus.    Extraocular Movements: Extraocular movements intact.     Conjunctiva/sclera: Conjunctivae normal.     Pupils: Pupils are equal, round, and reactive to light.  Cardiovascular:     Rate and Rhythm: Normal rate and regular rhythm.     Pulses: Normal pulses.     Heart sounds: Normal heart sounds. No murmur heard.    No friction rub. No gallop.  Pulmonary:     Effort: Pulmonary effort is normal. No respiratory distress.     Breath sounds: Wheezing (slight wheeze in the right upper lobe)  present. No rhonchi or rales.  Abdominal:     General: Bowel sounds are normal. There is no distension.     Palpations: Abdomen is soft. There is no hepatomegaly, splenomegaly or mass.     Tenderness: There is no abdominal tenderness.  Musculoskeletal:        General: No swelling or tenderness. Normal range of motion.     Cervical back: Normal range of motion and neck supple. No tenderness.     Right lower leg: Edema present.     Left lower leg: Edema present.     Comments: Trace edema   Lymphadenopathy:     Cervical: No cervical adenopathy.     Upper Body:     Right upper body: No supraclavicular or axillary adenopathy.  Left upper body: No supraclavicular or axillary adenopathy.     Lower Body: No right inguinal adenopathy. No left inguinal adenopathy.  Skin:    General: Skin is warm and dry.     Coloration: Skin is not jaundiced.     Findings: No rash.  Neurological:     Mental Status: He is alert and oriented to person, place, and time.     Cranial Nerves: No cranial nerve deficit.  Psychiatric:        Mood and Affect: Mood normal.        Behavior: Behavior normal.        Thought Content: Thought content normal.     LABS:      Latest Ref Rng & Units 03/31/2022    8:52 AM 03/15/2022   11:06 AM 03/08/2022   12:00 AM  CBC  WBC 4.0 - 10.5 K/uL 3.1  3.6     2.9      Hemoglobin 13.0 - 17.0 g/dL 11.6  12.7     12.5      Hematocrit 39.0 - 52.0 % 34.8  36     36      Platelets 150 - 400 K/uL 186  163     143         This result is from an external source.      Latest Ref Rng & Units 03/31/2022    8:52 AM 03/15/2022   11:06 AM 03/08/2022    2:52 PM  CMP  Glucose 70 - 99 mg/dL 82   130   BUN 6 - 20 mg/dL '16  23     18   '$ Creatinine 0.61 - 1.24 mg/dL 0.70  0.8     0.94   Sodium 135 - 145 mmol/L 138  134     139   Potassium 3.5 - 5.1 mmol/L 3.8  4.0     3.8   Chloride 98 - 111 mmol/L 103  99     101   CO2 22 - 32 mmol/L '27  28     28   '$ Calcium 8.9 - 10.3 mg/dL 9.2   9.3     9.3   Total Protein 6.5 - 8.1 g/dL 6.3   7.0   Total Bilirubin 0.3 - 1.2 mg/dL 0.4   0.2   Alkaline Phos 38 - 126 U/L 44  72     53   AST 15 - 41 U/L '15  19     20   '$ ALT 0 - 44 U/L '17  19     20      '$ This result is from an external source.     No results found for: "CEA1", "CEA" / No results found for: "CEA1", "CEA" No results found for: "PSA1" No results found for: "HLK562" No results found for: "CAN125"  No results found for: "TOTALPROTELP", "ALBUMINELP", "A1GS", "A2GS", "BETS", "BETA2SER", "GAMS", "MSPIKE", "SPEI" No results found for: "TIBC", "FERRITIN", "IRONPCTSAT" No results found for: "LDH"  STUDIES:  No results found.   CT soft tissue neck 02/03/2022    NM PET skull base to mid thigh 01/13/2022     Chest Xray 1 view 12/22/2021    HISTORY:   Past Medical History:  Diagnosis Date   Arthritis    Back pain    GERD (gastroesophageal reflux disease)    History of kidney stones    Hypertension    Sleep apnea    does not have cpap yet  Past Surgical History:  Procedure Laterality Date   APPENDECTOMY     HAND SURGERY     LUMBAR LAMINECTOMY/DECOMPRESSION MICRODISCECTOMY N/A 12/28/2017   Procedure: Laminectomy and Foraminotomy Lumbar two-three-Lumbar three-four - Lumbar four-five;  Surgeon: Kary Kos, MD;  Location: [facility];  Service: Neurosurgery;  Laterality: N/A;   OTHER SURGICAL HISTORY     removal on cancer from neck    Family History  Problem Relation Age of Onset   Thyroid disease Mother    Heart disease Father    Prostate cancer Brother    Healthy Daughter    Healthy Son     Social History:  reports that he has been smoking cigarettes. He has a 22.00 pack-year smoking history. He has never used smokeless tobacco. He reports that he does not currently use alcohol. He reports that he does not use drugs.The patient is accompanied by his wife today.  Allergies: No Known Allergies  Current Medications: Current Outpatient Medications   Medication Sig Dispense Refill   amitriptyline (ELAVIL) 75 MG tablet Take 1 tablet by mouth daily.     amLODipine (NORVASC) 10 MG tablet Take 1 tablet by mouth every morning.     aspirin EC 81 MG tablet Take by mouth.     atorvastatin (LIPITOR) 80 MG tablet Take 80 mg by mouth daily.     chlorhexidine (PERIDEX) 0.12 % solution SMARTSIG:1 Capful(s) By Mouth Twice Daily     cyanocobalamin (VITAMIN B12) 1000 MCG/ML injection Inject 1 mL every month by subcutaneous route for 1 day.     dexamethasone 0.5 MG/5ML elixir Take by mouth.     fenofibrate 54 MG tablet TAKE ONE TABLET BY MOUTH EVERY DAY FOR 30 DAYS     gabapentin (NEURONTIN) 800 MG tablet Take 800 mg by mouth 4 (four) times daily.     HYDROcodone-acetaminophen (NORCO) 10-325 MG tablet Take by mouth.     ibuprofen (ADVIL) 800 MG tablet Take by mouth.     ketorolac (TORADOL) 10 MG tablet Take 10 mg by mouth every 6 (six) hours as needed.     lidocaine (XYLOCAINE) 2 % solution SMARTSIG:15 Milliliter(s) By Mouth Every 3 Hours PRN     magic mouthwash (nystatin, lidocaine, diphenhydrAMINE, alum & mag hydroxide) suspension Swish and swallow 15 mLs 4 (four) times daily as needed for mouth pain.     magic mouthwash SOLN Take 15 mLs by mouth 4 (four) times daily as needed for mouth pain. 240 mL 5   nitroGLYCERIN (NITROSTAT) 0.4 MG SL tablet PLACE 1 TABLET UNDER THE TONGUE EVERY 5 MIN FOR CHEST PAIN AS NEEDED. NO MORE THAN 3 TABS IN 15 MIN.     oxyCODONE-acetaminophen (PERCOCET/ROXICET) 5-325 MG tablet Take 1-2 tablets by mouth every 4 (four) hours as needed for severe pain. (Patient not taking: Reported on 01/18/2022) 15 tablet 0   pantoprazole (PROTONIX) 40 MG tablet Take 1 tablet by mouth daily.     predniSONE (DELTASONE) 10 MG tablet Take 4 tablets (40 mg total) by mouth daily with breakfast. Take 4 tablets by mouth daily for 7 days, then 3 tablets by mouth daily for 7 days, then 2 tablets by mouth daily for 7 days and then 1 tablet by mouth daily  70 tablet 0   senna-docusate (SENOKOT-S) 8.6-50 MG tablet Take by mouth.     tiZANidine (ZANAFLEX) 4 MG tablet Take by mouth.     triamterene-hydrochlorothiazide (MAXZIDE-25) 37.5-25 MG tablet Take 1 tablet by mouth daily.     Current Facility-Administered  Medications  Medication Dose Route Frequency Provider Last Rate Last Admin   sodium chloride flush (NS) 0.9 % injection 10 mL  10 mL Intracatheter PRN [name], MD   10 mL at 03/31/22 0935     Wall as a scribe for [name], MD.,have documented all relevant documentation on the behalf of [name], MD,as directed by  [name], MD while in the presence of [name], MD.

## 2022-03-31 ENCOUNTER — Encounter: Payer: Self-pay | Admitting: Oncology

## 2022-03-31 ENCOUNTER — Inpatient Hospital Stay: Payer: Medicaid Other

## 2022-03-31 ENCOUNTER — Telehealth: Payer: Self-pay | Admitting: Oncology

## 2022-03-31 ENCOUNTER — Other Ambulatory Visit: Payer: Self-pay | Admitting: Hematology and Oncology

## 2022-03-31 ENCOUNTER — Other Ambulatory Visit: Payer: Self-pay | Admitting: Oncology

## 2022-03-31 ENCOUNTER — Inpatient Hospital Stay (INDEPENDENT_AMBULATORY_CARE_PROVIDER_SITE_OTHER): Payer: Medicaid Other | Admitting: Oncology

## 2022-03-31 VITALS — BP 125/79 | HR 75 | Temp 98.4°F | Resp 18 | Ht 69.0 in | Wt 250.0 lb

## 2022-03-31 DIAGNOSIS — C109 Malignant neoplasm of oropharynx, unspecified: Secondary | ICD-10-CM

## 2022-03-31 DIAGNOSIS — K1120 Sialoadenitis, unspecified: Secondary | ICD-10-CM | POA: Diagnosis not present

## 2022-03-31 DIAGNOSIS — Z5111 Encounter for antineoplastic chemotherapy: Secondary | ICD-10-CM | POA: Diagnosis not present

## 2022-03-31 LAB — CBC WITH DIFFERENTIAL (CANCER CENTER ONLY)
Abs Immature Granulocytes: 0.03 10*3/uL (ref 0.00–0.07)
Basophils Absolute: 0 10*3/uL (ref 0.0–0.1)
Basophils Relative: 0 %
Eosinophils Absolute: 0 10*3/uL (ref 0.0–0.5)
Eosinophils Relative: 1 %
HCT: 34.8 % — ABNORMAL LOW (ref 39.0–52.0)
Hemoglobin: 11.6 g/dL — ABNORMAL LOW (ref 13.0–17.0)
Immature Granulocytes: 1 %
Lymphocytes Relative: 39 %
Lymphs Abs: 1.2 10*3/uL (ref 0.7–4.0)
MCH: 33 pg (ref 26.0–34.0)
MCHC: 33.3 g/dL (ref 30.0–36.0)
MCV: 98.9 fL (ref 80.0–100.0)
Monocytes Absolute: 0.5 10*3/uL (ref 0.1–1.0)
Monocytes Relative: 16 %
Neutro Abs: 1.3 10*3/uL — ABNORMAL LOW (ref 1.7–7.7)
Neutrophils Relative %: 43 %
Platelet Count: 186 10*3/uL (ref 150–400)
RBC: 3.52 MIL/uL — ABNORMAL LOW (ref 4.22–5.81)
RDW: 16.7 % — ABNORMAL HIGH (ref 11.5–15.5)
WBC Count: 3.1 10*3/uL — ABNORMAL LOW (ref 4.0–10.5)
nRBC: 0 % (ref 0.0–0.2)

## 2022-03-31 LAB — COMPREHENSIVE METABOLIC PANEL
ALT: 17 U/L (ref 0–44)
AST: 15 U/L (ref 15–41)
Albumin: 3.4 g/dL — ABNORMAL LOW (ref 3.5–5.0)
Alkaline Phosphatase: 44 U/L (ref 38–126)
Anion gap: 8 (ref 5–15)
BUN: 16 mg/dL (ref 6–20)
CO2: 27 mmol/L (ref 22–32)
Calcium: 9.2 mg/dL (ref 8.9–10.3)
Chloride: 103 mmol/L (ref 98–111)
Creatinine, Ser: 0.7 mg/dL (ref 0.61–1.24)
GFR, Estimated: >60 mL/min (ref >60)
Glucose, Bld: 82 mg/dL (ref 70–99)
Potassium: 3.8 mmol/L (ref 3.5–5.1)
Sodium: 138 mmol/L (ref 135–145)
Total Bilirubin: 0.4 mg/dL (ref 0.3–1.2)
Total Protein: 6.3 g/dL — ABNORMAL LOW (ref 6.5–8.1)

## 2022-03-31 MED ORDER — MAGIC MOUTHWASH: 15.0000 mL | Freq: Four times a day (QID) | ORAL | 5 refills | Status: DC | PRN

## 2022-03-31 MED ORDER — HEPARIN SOD (PORK) LOCK FLUSH 100 UNIT/ML IV SOLN
500.0000 [IU] | Freq: Once | INTRAVENOUS | Status: AC | PRN
  Administered 2022-03-31: 500 [IU]

## 2022-03-31 MED ORDER — SODIUM CHLORIDE 0.9% FLUSH
10.0000 mL | INTRAVENOUS | Status: DC | PRN
  Administered 2022-03-31: 10 mL

## 2022-03-31 MED ORDER — PROCHLORPERAZINE MALEATE 10 MG PO TABS: 10.0000 mg | ORAL_TABLET | Freq: Four times a day (QID) | ORAL | 1 refills | Status: DC | PRN

## 2022-03-31 NOTE — Telephone Encounter (Signed)
Patient has been scheduled for follow-up visit per 03/31/22 los. Pt given an appt calendar with date and time.  

## 2022-04-01 ENCOUNTER — Other Ambulatory Visit: Payer: Self-pay

## 2022-04-03 ENCOUNTER — Encounter: Payer: Self-pay | Admitting: Oncology

## 2022-04-03 NOTE — Progress Notes (Signed)
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 This is p16 negative. He has several concerning features on his pathology, including 1 positive node with extranodal extension and focal lymphovascular invasion. He was healing well, and then developed left facial pain and swelling, this occurred prior to initiating any chemoradiation. CT scan was nonspecific. His ENT surgeon recommended that we proceed with his postoperative treatment of adjuvant concurrent chemoradiation. He is scheduled for Carboplatin + Paclitaxel cycle 6 day 1 tomorrow.   Painful left facial swelling  We can see soft tissue swelling on the CT scan and thought that this represented parotiditis. We had him initially on 1 antibiotic then changed to Clindamycin but neither helped. We asked his ENT surgeon for an opinion and he did not seem to know what this was either. He recommended we proceed with treatment and so we have. This does not seem to be spread of his throat cancer but did not respond to antibiotics and is very erythematous and tender and fluctuates up and down. I recommended a referral to a tertiary care center ENT and we did an MRI.   Chronic Back Pain Spinal stenosis with 4 back surgeries including vertebral fusion. Followed by [facility] with recent L-spine MRI.    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 He had carotid angioplasty on the left and may eventually require it on the right. We do see some chronic infarctions in the pons and basal ganglia.   Tinnitus/Hearing Loss We may want to avoid cisplatin as it may worsen this. I previously referred him to an audiologist.   Tobacco  Abuse He is motivated to quit and is currently on Chantix and is down to one half pack per day.      Plan: He continues to have persistent left-sided facial swelling and pain which has shown minimal improvement since he started the Prednisone taper a few days ago. He resumed his chemotherapy and will receive Carboplatin + Paclitaxel cycle 6 day 1 tomorrow after XRT He will have stat labs on 12/11 and will complete chemo the following day. He is scheduled to see Dr. Avon Gully at [facility] on 03/11/22. We will follow up in 3-4 weeks with labs. I discussed the assessment and treatment plan with the patient and his wife.  The patient was provided an opportunity to ask questions and all were answered.  The patient agreed with the plan and demonstrated an understanding of the instructions.  The patient was advised to call back if the symptoms worsen or if the condition fails to improve as anticipated.     I provided 30 minutes of face-to-face time during this encounter and > 50% was spent counseling as documented under my assessment and plan.      [name], MD  [facility] [address] [place] [address] Dept: [phone] Dept Fax: [phone]        Orders Placed This Encounter  Procedures   CBC and differential      This external order was created through the Results Console.   CBC      This external order was created  through the Results Console.    CHIEF COMPLAINT:  CC: Stage IVB oropharynx cancer   Current Treatment: Adjuvant chemoradiation with weekly carboplatin/paclitaxel   HISTORY OF PRESENT ILLNESS:       Oncology History  Oropharyngeal carcinoma ([facility])  11/06/2021 Initial Diagnosis    Oropharyngeal carcinoma ([place])    11/06/2021 Cancer Staging    Staging form: Pharynx - P16 Negative Oropharynx, AJCC 8th Edition - Clinical stage from 11/06/2021: Stage IVB (cT1, cN3, cM0, p16-) - Signed by [name], MD on 12/28/2021 Histopathologic type: Squamous cell carcinoma, NOS Stage prefix: Initial diagnosis Histologic grade (G): G2 Histologic grading system: 4 grade system Tumor size (mm): 13 Lymph-vascular invasion (LVI): LVI present/identified, NOS Diagnostic confirmation: Positive histology Specimen type: Excision Staged by: Managing physician Presence of extranodal extension: Present Extent of extranodal extension: Microscopic ENE(+) Distance of extension from the native lymph node capsule to the farthest point of invasion in the extranodal tissue (mm): 6.5 ECOG performance status: Grade 1 Perineural invasion (PNI): Absent Prognostic indicators: 1.3 cm with 1/23 nodes positive -2.3 cm with ENE and focal LVI Stage used in treatment planning: Yes National guidelines used in treatment planning: Yes Type of national guideline used in treatment planning: NCCN    01/06/2022 -  Chemotherapy    Patient is on Treatment Plan : HEAD/NECK Carboplatin + Paclitaxel + XRT q7d     01/19/2022 Imaging    CT neck:   IMPRESSION:  1. 26 x 11 x 11 mm soft tissue mass posterior to the left  submandibular gland and anterior to the SCM. Some residual soft  tissue is noted in the PET scan. While this may represent  postoperative scar tissue, it is concerning for residual or  recurrent tumor.  2. Asymmetric soft tissue at the left glossal tonsillar sulcus with  focal surgical clips. No definite mass lesion is present.  3. Left carotid stent is in place. Vessel appears patent.  4. Atherosclerotic calcifications at the right carotid bifurcation  with what appears to be a high-grade proximal right ICA stenosis.  5. Right IJ Port-A-Cath in place.           INTERVAL HISTORY:  Tanish is here today for repeat clinical assessment for Stage IVB oropharynx cancer. He is scheduled for Carboplatin + Paclitaxel cycle 6 day 1 tomorrow. He continues to have good days and bad days. He continues to have  swelling and redness of the left side of his neck. He has been taking the Prednisone at '40mg'$  daily for a few days with minimal improvement. I have him on a slow taper. His most recent MRI scan reveals swelling/inflammation and concluded this was post surgical changes, post treatment change. CT scan in October 2023 revealed soft tissue mass affect, but we really don't see any mass on the MRI. He has an appointment with Dr. Avon Gully at [facility] on 03/11/22. He denies fevers or chills. His appetite is good. His weight has increased 1 pound since his last visit.    REVIEW OF SYSTEMS:  Review of Systems  Constitutional:  Negative for appetite change, chills, fever and unexpected weight change.  HENT:   Negative for lump/mass, mouth sores and sore throat.        + left facial swelling  Eyes: Negative.   Respiratory: Negative.  Negative for chest tightness, cough, hemoptysis, shortness of breath and wheezing.   Cardiovascular: Negative.  Negative for chest pain, leg swelling and palpitations.  Gastrointestinal: Negative.  Negative for abdominal distention, abdominal  pain, blood in stool, constipation, diarrhea, nausea and vomiting.  Endocrine: Negative.  Negative for hot flashes.  Genitourinary: Negative.  Negative for difficulty urinating, dysuria, frequency and hematuria.   Musculoskeletal:  Negative for arthralgias, back pain, flank pain and gait problem.  Skin: Negative.   Neurological: Negative.  Negative for dizziness, extremity weakness, gait problem, headaches, light-headedness, numbness, seizures and speech difficulty.  Hematological: Negative.  Negative for adenopathy. Does not bruise/bleed easily.  Psychiatric/Behavioral: Negative.  Negative for depression and sleep disturbance. The patient is not nervous/anxious.       VITALS:  Blood pressure (!) 142/75, pulse 77, temperature 98.7 F (37.1 C), temperature source Oral, resp. rate 16, height '5\' 9"'$  (1.753 m), weight 247 lb 3.2 oz (112.1  kg), SpO2 100 %.     Wt Readings from Last 3 Encounters:  03/31/22 250 lb (113.4 kg)  03/16/22 241 lb 1.9 oz (109.4 kg)  03/09/22 245 lb (111.1 kg)    Body mass index is 36.51 kg/m.   Performance status (ECOG): 1 - Symptomatic but completely ambulatory   PHYSICAL EXAM:  Physical Exam Vitals and nursing note reviewed.  Constitutional:      General: He is not in acute distress.    Appearance: Normal appearance. He is normal weight. He is not ill-appearing or toxic-appearing.  HENT:     Head: Normocephalic and atraumatic.     Comments: Radiation changes of left neck with desquamation and hyperpigmentation. He still has erythematous swelling of left face which is tender even down into his clavicular area.    Mouth/Throat:     Mouth: Mucous membranes are moist.     Pharynx: Oropharynx is clear. No oropharyngeal exudate or posterior oropharyngeal erythema.  Eyes:     General: No scleral icterus.    Extraocular Movements: Extraocular movements intact.     Conjunctiva/sclera: Conjunctivae normal.     Pupils: Pupils are equal, round, and reactive to light.  Cardiovascular:     Rate and Rhythm: Normal rate and regular rhythm.     Pulses: Normal pulses.     Heart sounds: Normal heart sounds. No murmur heard.    No friction rub. No gallop.     Comments: Mild BLE edema. Pulmonary:     Effort: Pulmonary effort is normal. No respiratory distress.     Breath sounds: Wheezing present. No rhonchi or rales.     Comments: Scattered expiratory wheezes R>L. Abdominal:     General: Bowel sounds are normal. There is no distension.     Palpations: Abdomen is soft. There is no hepatomegaly, splenomegaly or mass.     Tenderness: There is no abdominal tenderness.  Musculoskeletal:        General: No swelling or tenderness. Normal range of motion.     Cervical back: Normal range of motion and neck supple. No tenderness.     Right lower leg: Edema present.     Left lower leg: Edema present.   Lymphadenopathy:     Cervical: No cervical adenopathy.     Upper Body:     Right upper body: No supraclavicular or axillary adenopathy.     Left upper body: No supraclavicular or axillary adenopathy.     Lower Body: No right inguinal adenopathy. No left inguinal adenopathy.  Skin:    General: Skin is warm and dry.     Coloration: Skin is not jaundiced.     Findings: No rash.  Neurological:     Mental Status: He is alert and oriented to  person, place, and time.     Cranial Nerves: No cranial nerve deficit.  Psychiatric:        Mood and Affect: Mood normal.        Behavior: Behavior normal.        Thought Content: Thought content normal.        LABS:        Latest Ref Rng & Units 03/31/2022    8:52 AM 03/15/2022   11:06 AM 03/08/2022   12:00 AM  CBC  WBC 4.0 - 10.5 K/uL 3.1  3.6     2.9      Hemoglobin 13.0 - 17.0 g/dL 11.6  12.7     12.5      Hematocrit 39.0 - 52.0 % 34.8  36     36      Platelets 150 - 400 K/uL 186  163     143         This result is from an external source.        Latest Ref Rng & Units 03/31/2022    8:52 AM 03/15/2022   11:06 AM 03/08/2022    2:52 PM  CMP  Glucose 70 - 99 mg/dL 82    130   BUN 6 - 20 mg/dL '16  23     18   '$ Creatinine 0.61 - 1.24 mg/dL 0.70  0.8     0.94   Sodium 135 - 145 mmol/L 138  134     139   Potassium 3.5 - 5.1 mmol/L 3.8  4.0     3.8   Chloride 98 - 111 mmol/L 103  99     101   CO2 22 - 32 mmol/L '27  28     28   '$ Calcium 8.9 - 10.3 mg/dL 9.2  9.3     9.3   Total Protein 6.5 - 8.1 g/dL 6.3    7.0   Total Bilirubin 0.3 - 1.2 mg/dL 0.4    0.2   Alkaline Phos 38 - 126 U/L 44  72     53   AST 15 - 41 U/L '15  19     20   '$ ALT 0 - 44 U/L '17  19     20      '$ This result is from an external source.        Recent Labs  No results found for: "CEA1", "CEA"   /  Last Labs  No results found for: "CEA1", "CEA"   Recent Labs  No results found for: "PSA1"   Recent Labs  No results found for: "OBS962"   Recent Labs  No  results found for: "CAN125"    Recent Labs  No results found for: "TOTALPROTELP", "ALBUMINELP", "A1GS", "A2GS", "BETS", "BETA2SER", "GAMS", "MSPIKE", "SPEI"   Recent Labs  No results found for: "TIBC", "FERRITIN", "IRONPCTSAT"   Recent Labs  No results found for: "LDH"     STUDIES:    MR ORBITS W WO CONTRAST Result Date: 03/02/2022 CLINICAL DATA:  Patient has a history of oropharyngeal carcinoma status post chemotherapy and surgical resection (hemiglossectomy and tonsillectomy). EXAM: MRI OF THE NECK WITH CONTRAST MRI OF THE NECK WITHOUT AND WITH CONTRAST TECHNIQUE: Multiplanar, multisequence MR imaging was performed following the administration of intravenous contrast. Multiplanar, multi-echo pulse sequences of the orbits and surrounding structures were acquired including fat saturation techniques, before and after intravenous contrast administration. CONTRAST:  63m GADAVIST GADOBUTROL 1 MMOL/ML IV SOLN COMPARISON:  CT  Neck 01/19/22 FINDINGS: ORBITS FINDINGS: Orbits:  Right-sided lens replacement. Visualized sinuses: Air-fluid level in the right sphenoid sinus. Mild mucosal thickening bilateral ethmoid sinuses. Limited intracranial: Chronic infarct in the central pons (neck series 7, image 1). Chronic infarcts in the bilateral basal ganglia. There is also a chronic infarct in the left parietal lobe. NECK FINDINGS: Pharynx and larynx: Patient is status post left hemiglossectomy and tonsillectomy. There is asymmetric contrast enhancement along the left aspect of the tongue (orbits series 14, image 1). There is asymmetric T2 hyperintense signal in the in the parapharyngeal space on the left (series 7, image 16) and in the soft tissues overlying the left sternocleidomastoid muscle body (series 7, image 23). T2 hyperintense signal is also seen in the soft tissues around the angle of the mandible on the left (series 7, image 18/series 6, image 28), particularly along the posterior and inferior aspect of  the mylohyoid and the medial pterygoid, respectively. Asymmetric T2 hyperintense signal is also present within the lateral pterygoid muscle body on the left (series 7, image 7). No discrete soft tissue mass is visualized in the region of the left submandibular gland the correlate for the finding seen on prior CT of the neck dated 01/19/2022, particularly along the anterior superior margin of the left SCM. No abnormal lymphadenopathy seen in this region. Mild asymmetric contrast enhancement and T2 hyperintense signal is also seen within the paraspinal musculature on the left (series 7, image 40). Salivary glands: There is mild asymmetric T2 hyperintense signal along the lateral and posterior margins of the left submandibular gland. The left parotid gland is normal in appearance. The intraparotid facial nerve side demonstrate asymmetric contrast enhancement to suggest perineural spread of tumor. Thyroid: Normal Lymph nodes: No suspicious lymphadenopathy. Vascular: Normal flow voids. Mastoids and visualized paranasal sinuses: Bilateral maxillary sinuses are clear. Trace left mastoid effusion. Skeleton: No evidence of high-grade spinal canal stenosis. No suspicious lesions visualized. Upper chest: Negative Other: None IMPRESSION: 1. Postsurgical changes of left hemiglossectomy, tonsillectomy, and likely left neck dissection. There is ill defined T2 hyperintense signal in the region of the left submandibular gland at the area of abnormality seen on recent prior CT neck, but without a discrete soft tissue mass. There is also asymmetric T2 hyperintense signal abnormality and contrast enhancement in the left parapharyngeal space, surrounding the medial pterygoid and masseter muscle bodies, and along the posterior margin of the mylohyoid on the left. These findings are favored to represent evolving post-surgical changes, but close follow up is recommended to ensure continued resolution of signal abnormality and soft tissue  mass seen on recent CT. 2. There is asymmetric contrast enhancement along the left aspect of the tongue. Findings are favored to represent post treatment change, with residual tumor not entirely excluded. Correlate with direct visualization. 3. Mild asymmetric T2 hyperintense signal and contrast enhancement within the paraspinal musculature on the left, nonspecific, possibly post-traumatic. 4. Chronic infarcts in the central pons, bilateral basal ganglia, and left parietal lobe. 5. Air-fluid level in the right sphenoid sinus. Correlate for symptoms of acute sinusitis. Electronically Signed   By: Marin Roberts M.D.   On: 03/03/2022 09:30    MR NECK SOFT TISSUE ONLY W WO CONTRAST Result Date: 03/02/2022 CLINICAL DATA:  Patient has a history of oropharyngeal carcinoma status post chemotherapy and surgical resection (hemiglossectomy and tonsillectomy). EXAM: MRI OF THE NECK WITH CONTRAST MRI OF THE NECK WITHOUT AND WITH CONTRAST TECHNIQUE: Multiplanar, multisequence MR imaging was performed following the administration of intravenous contrast.  Multiplanar, multi-echo pulse sequences of the orbits and surrounding structures were acquired including fat saturation techniques, before and after intravenous contrast administration. CONTRAST:  47m GADAVIST GADOBUTROL 1 MMOL/ML IV SOLN COMPARISON:  CT Neck 01/19/22 FINDINGS: ORBITS FINDINGS: Orbits:  Right-sided lens replacement. Visualized sinuses: Air-fluid level in the right sphenoid sinus. Mild mucosal thickening bilateral ethmoid sinuses. Limited intracranial: Chronic infarct in the central pons (neck series 7, image 1). Chronic infarcts in the bilateral basal ganglia. There is also a chronic infarct in the left parietal lobe. NECK FINDINGS: Pharynx and larynx: Patient is status post left hemiglossectomy and tonsillectomy. There is asymmetric contrast enhancement along the left aspect of the tongue (orbits series 14, image 1). There is asymmetric T2 hyperintense  signal in the in the parapharyngeal space on the left (series 7, image 16) and in the soft tissues overlying the left sternocleidomastoid muscle body (series 7, image 23). T2 hyperintense signal is also seen in the soft tissues around the angle of the mandible on the left (series 7, image 18/series 6, image 28), particularly along the posterior and inferior aspect of the mylohyoid and the medial pterygoid, respectively. Asymmetric T2 hyperintense signal is also present within the lateral pterygoid muscle body on the left (series 7, image 7). No discrete soft tissue mass is visualized in the region of the left submandibular gland the correlate for the finding seen on prior CT of the neck dated 01/19/2022, particularly along the anterior superior margin of the left SCM. No abnormal lymphadenopathy seen in this region. Mild asymmetric contrast enhancement and T2 hyperintense signal is also seen within the paraspinal musculature on the left (series 7, image 40). Salivary glands: There is mild asymmetric T2 hyperintense signal along the lateral and posterior margins of the left submandibular gland. The left parotid gland is normal in appearance. The intraparotid facial nerve side demonstrate asymmetric contrast enhancement to suggest perineural spread of tumor. Thyroid: Normal Lymph nodes: No suspicious lymphadenopathy. Vascular: Normal flow voids. Mastoids and visualized paranasal sinuses: Bilateral maxillary sinuses are clear. Trace left mastoid effusion. Skeleton: No evidence of high-grade spinal canal stenosis. No suspicious lesions visualized. Upper chest: Negative Other: None IMPRESSION: 1. Postsurgical changes of left hemiglossectomy, tonsillectomy, and likely left neck dissection. There is ill defined T2 hyperintense signal in the region of the left submandibular gland at the area of abnormality seen on recent prior CT neck, but without a discrete soft tissue mass. There is also asymmetric T2 hyperintense signal  abnormality and contrast enhancement in the left parapharyngeal space, surrounding the medial pterygoid and masseter muscle bodies, and along the posterior margin of the mylohyoid on the left. These findings are favored to represent evolving post-surgical changes, but close follow up is recommended to ensure continued resolution of signal abnormality and soft tissue mass seen on recent CT. 2. There is asymmetric contrast enhancement along the left aspect of the tongue. Findings are favored to represent post treatment change, with residual tumor not entirely excluded. Correlate with direct visualization. 3. Mild asymmetric T2 hyperintense signal and contrast enhancement within the paraspinal musculature on the left, nonspecific, possibly post-traumatic. 4. Chronic infarcts in the central pons, bilateral basal ganglia, and left parietal lobe. 5. Air-fluid level in the right sphenoid sinus. Correlate for symptoms of acute sinusitis. Electronically Signed   By: HMarin RobertsM.D.   On: 03/03/2022 09:30      CT soft tissue neck 02/03/2022      NM PET skull base to mid thigh 01/13/2022  Chest Xray 1 view 12/22/2021      HISTORY:        Past Medical History:  Diagnosis Date   Arthritis     Back pain     GERD (gastroesophageal reflux disease)     History of kidney stones     Hypertension     Sleep apnea      does not have cpap yet           Past Surgical History:  Procedure Laterality Date   APPENDECTOMY       HAND SURGERY       LUMBAR LAMINECTOMY/DECOMPRESSION MICRODISCECTOMY N/A 12/28/2017    Procedure: Laminectomy and Foraminotomy Lumbar two-three-Lumbar three-four - Lumbar four-five;  Surgeon: Kary Kos, MD;  Location: [place];  Service: Neurosurgery;  Laterality: N/A;   OTHER SURGICAL HISTORY        removal on cancer from neck           Family History  Problem Relation Age of Onset   Thyroid disease Mother     Heart disease Father     Prostate cancer Brother     Healthy  Daughter     Healthy Son        Social History:  reports that he has been smoking cigarettes. He has a 22.00 pack-year smoking history. He has never used smokeless tobacco. He reports that he does not currently use alcohol. He reports that he does not use drugs.The patient is accompanied by his wife today.   Allergies: No Known Allergies   Current Medications:       Current Outpatient Medications  Medication Sig Dispense Refill   amitriptyline (ELAVIL) 75 MG tablet Take 1 tablet by mouth daily.       amLODipine (NORVASC) 10 MG tablet Take 1 tablet by mouth every morning.       aspirin EC 81 MG tablet Take by mouth.       atorvastatin (LIPITOR) 80 MG tablet Take 80 mg by mouth daily.       chlorhexidine (PERIDEX) 0.12 % solution SMARTSIG:1 Capful(s) By Mouth Twice Daily       cyanocobalamin (VITAMIN B12) 1000 MCG/ML injection Inject 1 mL every month by subcutaneous route for 1 day.       dexamethasone 0.5 MG/5ML elixir Take by mouth.       fenofibrate 54 MG tablet TAKE ONE TABLET BY MOUTH EVERY DAY FOR 30 DAYS       gabapentin (NEURONTIN) 800 MG tablet Take 800 mg by mouth 4 (four) times daily.       HYDROcodone-acetaminophen (NORCO) 10-325 MG tablet Take by mouth.       ibuprofen (ADVIL) 800 MG tablet Take by mouth.       ketorolac (TORADOL) 10 MG tablet Take 10 mg by mouth every 6 (six) hours as needed.       lidocaine (XYLOCAINE) 2 % solution SMARTSIG:15 Milliliter(s) By Mouth Every 3 Hours PRN       magic mouthwash (nystatin, lidocaine, diphenhydrAMINE, alum & mag hydroxide) suspension Swish and swallow 15 mLs 4 (four) times daily as needed for mouth pain.       magic mouthwash SOLN Take 15 mLs by mouth 4 (four) times daily as needed for mouth pain. 240 mL 5   nitroGLYCERIN (NITROSTAT) 0.4 MG SL tablet PLACE 1 TABLET UNDER THE TONGUE EVERY 5 MIN FOR CHEST PAIN AS NEEDED. NO MORE THAN 3 TABS IN 15 MIN.       ondansetron (ZOFRAN)  8 MG tablet Take 1 tablet (8 mg total) by mouth every 8  (eight) hours as needed for nausea or vomiting. Start on the third day after chemotherapy. 30 tablet 1   oxyCODONE-acetaminophen (PERCOCET/ROXICET) 5-325 MG tablet Take 1-2 tablets by mouth every 4 (four) hours as needed for severe pain. (Patient not taking: Reported on 01/18/2022) 15 tablet 0   pantoprazole (PROTONIX) 40 MG tablet Take 1 tablet by mouth daily.       predniSONE (DELTASONE) 10 MG tablet Take 4 tablets (40 mg total) by mouth daily with breakfast. Take 4 tablets by mouth daily for 7 days, then 3 tablets by mouth daily for 7 days, then 2 tablets by mouth daily for 7 days and then 1 tablet by mouth daily 70 tablet 0   prochlorperazine (COMPAZINE) 10 MG tablet Take 1 tablet (10 mg total) by mouth every 6 (six) hours as needed for nausea or vomiting. 30 tablet 1   senna-docusate (SENOKOT-S) 8.6-50 MG tablet Take by mouth.       tiZANidine (ZANAFLEX) 4 MG tablet Take by mouth.       triamterene-hydrochlorothiazide (MAXZIDE-25) 37.5-25 MG tablet Take 1 tablet by mouth daily.        No current facility-administered medications for this visit.             Facility-Administered Medications Ordered in Other Visits  Medication Dose Route Frequency Provider Last Rate Last Admin   sodium chloride flush (NS) 0.9 % injection 10 mL  10 mL Intracatheter PRN [name], MD   10 mL at 03/31/22 0935      I,Alexis Herring,acting as a scribe for [name], MD.,have documented all relevant documentation on the behalf of [name], MD,as directed by  [name], MD while in the presence of [name], MD.   I have reviewed this report as typed by the medical scribe, and it is complete and accurate.   [name]   04/03/22 8:55 AM          My Note Deleted

## 2022-04-08 ENCOUNTER — Other Ambulatory Visit: Payer: Self-pay | Admitting: Hematology and Oncology

## 2022-04-08 DIAGNOSIS — C109 Malignant neoplasm of oropharynx, unspecified: Secondary | ICD-10-CM

## 2022-04-08 MED ORDER — ONDANSETRON HCL 8 MG PO TABS: 8.0000 mg | ORAL_TABLET | Freq: Three times a day (TID) | ORAL | 1 refills | Status: DC | PRN

## 2022-04-09 ENCOUNTER — Telehealth: Payer: Self-pay

## 2022-04-09 ENCOUNTER — Other Ambulatory Visit: Payer: Self-pay

## 2022-04-09 DIAGNOSIS — C109 Malignant neoplasm of oropharynx, unspecified: Secondary | ICD-10-CM

## 2022-04-09 NOTE — Addendum Note (Signed)
Addended by: Belva Chimes on: 04/09/2022 02:14 PM   Modules accepted: Orders

## 2022-04-09 NOTE — Telephone Encounter (Signed)
Referral sent to DR. Moorhead's office not Dr. Maxie Barb

## 2022-04-09 NOTE — Telephone Encounter (Signed)
-----   Message from [name], MD sent at 04/09/2022  2:39 PM EST ----- Regarding: RE: Port and PEG removal Yes, he is okay to have both removed.  Sorry, I thought we had already sent that, do I need to do anything? ----- Message ----- From: Belva Chimes, LPN Sent: 5/0/5397  67:34 AM EST To: [name], MD Subject: Port and PEG removal                           Dr. Maxie Barb office called wanting to know if  patient is ok to get his PORT and PEG removed. They have him already on the scheduled for next Fri 04/16/22 if you are ok with it. Referral just needs to be faxed to them.

## 2022-04-11 ENCOUNTER — Other Ambulatory Visit: Payer: Self-pay

## 2022-04-16 ENCOUNTER — Other Ambulatory Visit: Payer: Self-pay | Admitting: Oncology

## 2022-04-21 ENCOUNTER — Encounter: Payer: Self-pay | Admitting: Oncology

## 2022-04-22 ENCOUNTER — Telehealth: Payer: Self-pay

## 2022-04-22 ENCOUNTER — Other Ambulatory Visit: Payer: Self-pay | Admitting: Oncology

## 2022-04-22 DIAGNOSIS — C109 Malignant neoplasm of oropharynx, unspecified: Secondary | ICD-10-CM

## 2022-04-22 MED ORDER — OXYCODONE HCL 10 MG PO TABS: 10.0000 mg | ORAL_TABLET | ORAL | 0 refills | Status: DC | PRN

## 2022-04-22 NOTE — Telephone Encounter (Signed)
Pt's wife, Lynelle Smoke, called to report that Timmy's head pain is worse. He is "taking goody's out the ass, like every 2 hours". The hydrocodone doesn't touch the pain at all. Face is swollen, ear hurts too. Please advise.

## 2022-04-23 ENCOUNTER — Encounter: Payer: Self-pay | Admitting: Oncology

## 2022-04-23 ENCOUNTER — Telehealth: Payer: Self-pay

## 2022-04-23 NOTE — Telephone Encounter (Signed)
Pharmacist calling to make sure pt is aware that he will NOT be taking hydrocodone and oxycodone. Pt was given hydrocodone 10/325 to take 5 times/day qty 150 on 02/01/2023.

## 2022-04-27 ENCOUNTER — Telehealth: Payer: Self-pay

## 2022-04-27 NOTE — Telephone Encounter (Signed)
Pt's wife called and req an appt to see Dr Hinton Rao today or tomorrow. She states "he only wants to see Dr Hinton Rao". I told her Dr Hinton Rao didn't have any openings until end of month. She states "the swelling is worse in his face and jaw. He is eating goody's out the ying yang. He is miserable. The pain medication she gave him hasn't helped at all. He tells me he has the worse headache of his life. Someone should be able to figure out where all this pain and swelling is coming from". I told her that Dr Hinton Rao has said in the past, that this is coming from his surgery. I encouraged her to take him to the ER @ [facility] for evaluation.

## 2022-05-03 ENCOUNTER — Encounter: Payer: Self-pay | Admitting: Oncology

## 2022-05-04 ENCOUNTER — Telehealth: Payer: Self-pay

## 2022-05-04 NOTE — Telephone Encounter (Signed)
Levada Dy, pharmacist @ [facility] called to make [place] aware that pt's PCP has sent in hydrocodone 10/325 qty # 150 today. He told us the hydrocodone didn't touch the pain, thus you sent in oxycodone '10mg'$  30 tabs on 04/22/2022. She just wants to know what she should do.  Do we want PCP to handle the ordering of pain medications?  Above message sent to Dr Hinton Rao @ [id number].

## 2022-05-10 NOTE — Progress Notes (Signed)
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 This is p16 negative. He has several concerning features on his pathology, including 1 positive node with extranodal extension and focal lymphovascular invasion. He was healing well, and then developed left facial pain and swelling, this occurred prior to initiating any chemoradiation. CT scan was nonspecific. MRI scan did not show any mass affect and was consistent with inflammation of the area. He has now completed his full course of radiation along with 7 cycles of chemotherapy with carboplatin and taxol as of  December, 2023. I find no evidence of disease.   Painful left facial swelling  We can see soft tissue swelling on the CT scan and originally thought that this represented parotiditis. We had him initially on 1 antibiotic then changed to Clindamycin but neither helped. We asked his ENT surgeon for an opinion and he did not seem to know what this was either. He recommended we proceed with treatment and so we have. This does not seem to be spread of his throat cancer but did not respond to antibiotics and is very erythematous and extremely tender, and fluctuates up and down. I also tried a course of steroids which had very little benefit. I recommended a referral to a tertiary care center ENT and he was seen by Dr. Avon Case at [facility].  He feels this represents postoperative changes that are just more pronounced than usual. He is already on multiple medications for pain control including Gabapentin at high doses, Tizanidine, Amitriptyline, Ibuprofen, ketorolac, and PRN hydrocodone.  Chronic Back Pain Spinal stenosis with 4 back surgeries including vertebral fusion. He has  chronic pain and neuropathy, and is followed by [facility].   CVA, 2023 He still has some residual right upper extremity paresthesias. He had carotid angioplasty on the left and we do see some chronic infarctions in the pons and basal ganglia.   Tobacco Abuse    Plan  He continues to have persistent left-sided facial swelling and pain but this has improved somewhat. He continues to take Goodie powders as this helps a little, but hopefully he can go to less if the antiinflammatory is effective. He will stop taking ibuprofen and I will prescribe Meloxicam to help. He will return in 2 months with CBC, CMP and CT of the soft tissue neck. The patient was provided an opportunity to ask questions and all were answered.  The patient and wife agreed with the plan and demonstrated an understanding of the instructions.  The patient was advised to call back if the symptoms worsen or if the condition fails to improve as anticipated.  I provided 20 minutes of face-to-face time during this encounter and > 50% was spent counseling as documented under my assessment and plan.    Ian Kaplan, MD  [facility] [address] [place] [address] Dept: [phone] Dept Fax: [phone]   No orders of the defined types were placed in this encounter.   CHIEF COMPLAINT:  CC: Stage IVB oropharynx cancer  Current Treatment: Adjuvant chemoradiation with weekly carboplatin/paclitaxel  HISTORY OF PRESENT ILLNESS:   Oncology History  Oropharyngeal carcinoma ([place])  11/06/2021 Initial Diagnosis   Oropharyngeal carcinoma ([facility])  11/06/2021 Cancer Staging   Staging form: Pharynx - P16 Negative Oropharynx, AJCC 8th Edition - Clinical stage from 11/06/2021: Stage IVB (cT1, cN3, cM0, p16-) - Signed by Ian Kaplan, MD on 12/28/2021 Histopathologic type: Squamous cell carcinoma, NOS Stage prefix: Initial diagnosis Histologic grade (G):  G2 Histologic grading system: 4 grade system Tumor size (mm): 13 Lymph-vascular invasion (LVI): LVI present/identified, NOS Diagnostic confirmation: Positive histology Specimen type: Excision Staged by: Managing physician Presence of extranodal extension: Present Extent of extranodal extension: Microscopic ENE(+) Distance of extension from the native lymph node capsule to the farthest point of invasion in the extranodal tissue (mm): 6.5 ECOG performance status: Grade 1 Perineural invasion (PNI): Absent Prognostic indicators: 1.3 cm with 1/23 nodes positive -2.3 cm with ENE and focal LVI Stage used in treatment planning: Yes National guidelines used in treatment planning: Yes Type of national guideline used in treatment planning: NCCN   01/06/2022 - 03/16/2022 Chemotherapy   Patient is on Treatment Plan : HEAD/NECK Carboplatin + Paclitaxel + XRT q7d     01/19/2022 Imaging   CT neck:  IMPRESSION:  1. 26 x 11 x 11 mm soft tissue mass posterior to the left  submandibular gland and anterior to the SCM. Some residual soft  tissue is noted in the PET scan. While this may represent  postoperative scar tissue, it is concerning for residual or  recurrent tumor.  2. Asymmetric soft tissue at the left glossal tonsillar sulcus with  focal surgical clips. No definite mass lesion is present.  3. Left carotid stent is in place. Vessel appears patent.  4. Atherosclerotic calcifications at the right carotid bifurcation  with what appears to be a high-grade proximal right ICA stenosis.  5. Right IJ Port-A-Cath in place.        INTERVAL HISTORY:  Ian Case is here today for repeat clinical assessment for Stage IVB oropharynx cancer. Patient states that he feels poorly and complaints of severe swelling and soreness of his left face and jaw. I informed him that the swelling of the anterior neck can be a result from radiation in that area. While he has always had some swelling of the left face even  before chemoradiation. I answered his questions about the swelling and soreness. CT scan revealed soft tissue mass affect in October, 2023 but an MRI in November was read as inflammation and no mass, or evidence of cancer.  He takes about 5-6 Goodie powders a day and takes 2 pills of 817m ibuprofen a day. I suggested we try a different antiinflammatory medicine. He will stop taking ibuprofen and I will prescribe meloxicam to take once a day to help. He will still likely require occasional Goodie powders, but I cautioned him about the potential for ulcers or bleeding. He will return in 2 months with CBC, CMP and CT of the soft tissue neck. He denies signs of infection such as sore throat, sinus drainage, cough, or urinary symptoms.  He denies fevers or recurrent chills. He denies pain. He denies nausea, vomiting, chest pain, dyspnea or cough. His appetite is good and his weight has increased 9 pounds over last month . He is accompanied by his wife for today's visit.   REVIEW OF SYSTEMS:  Review of Systems  Constitutional: Negative.  Negative for appetite change, chills, diaphoresis, fatigue, fever and unexpected weight change.  HENT:   Positive for mouth sores and trouble swallowing. Negative for hearing loss, lump/mass, nosebleeds, sore throat, tinnitus and voice change.        +  left ear pain intermittently + left jaw pain + left facial swelling + sore in the left posterior throat  Eyes: Negative.  Negative for eye problems and icterus.  Respiratory: Negative.  Negative for chest tightness, cough, hemoptysis, shortness of breath and wheezing.   Cardiovascular: Negative.  Negative for chest pain, leg swelling and palpitations.  Gastrointestinal: Negative.  Negative for abdominal distention, abdominal pain, blood in stool, constipation, diarrhea, nausea, rectal pain and vomiting.  Endocrine: Negative.  Negative for hot flashes.  Genitourinary: Negative.  Negative for bladder incontinence, difficulty  urinating, dyspareunia, dysuria, frequency, hematuria, nocturia, pelvic pain and penile discharge.   Musculoskeletal:  Positive for arthralgias, back pain and myalgias. Negative for flank pain, gait problem, neck pain and neck stiffness.  Skin: Negative.  Negative for itching, rash and wound.  Neurological: Negative.  Negative for dizziness, extremity weakness, gait problem, headaches, light-headedness, numbness, seizures and speech difficulty.  Hematological: Negative.  Negative for adenopathy. Does not bruise/bleed easily.  Psychiatric/Behavioral: Negative.  Negative for confusion, decreased concentration, depression, sleep disturbance and suicidal ideas. The patient is not nervous/anxious.      VITALS:  Blood pressure (!) 141/80, pulse 74, temperature 98.4 F (36.9 C), temperature source Oral, resp. rate 20, height 5' 9"$  (1.753 m), weight 259 lb 14.4 oz (117.9 kg), SpO2 100 %.  Wt Readings from Last 3 Encounters:  05/12/22 259 lb 14.4 oz (117.9 kg)  03/31/22 250 lb (113.4 kg)  03/16/22 241 lb 1.9 oz (109.4 kg)    Body mass index is 38.38 kg/m.  Performance status (ECOG): 1 - Symptomatic but completely ambulatory  PHYSICAL EXAM:  Physical Exam Vitals and nursing note reviewed. Exam conducted with a chaperone present.  Constitutional:      General: He is not in acute distress.    Appearance: Normal appearance. He is normal weight. He is not ill-appearing, toxic-appearing or diaphoretic.  HENT:     Head: Normocephalic and atraumatic.     Comments: Large scar left neck. Swelling and erythema of left face which is mildly improved. Mild edema of anterior bilateral neck areas but these are not tender or erythematous and are consistent with lymphedema.     Right Ear: Tympanic membrane, ear canal and external ear normal. There is no impacted cerumen.     Left Ear: Tympanic membrane, ear canal and external ear normal. There is no impacted cerumen.     Nose: Nose normal. No congestion or  rhinorrhea.     Mouth/Throat:     Mouth: Mucous membranes are moist.     Pharynx: Oropharynx is clear. No oropharyngeal exudate or posterior oropharyngeal erythema.  Eyes:     General: No scleral icterus.       Right eye: No discharge.        Left eye: No discharge.     Extraocular Movements: Extraocular movements intact.     Conjunctiva/sclera: Conjunctivae normal.     Pupils: Pupils are equal, round, and reactive to light.  Neck:     Vascular: No carotid bruit.  Cardiovascular:     Rate and Rhythm: Normal rate and regular rhythm.     Pulses: Normal pulses.     Heart sounds: Normal heart sounds. No murmur heard.    No friction rub. No gallop.  Pulmonary:     Effort: Pulmonary effort is normal. No respiratory distress.     Breath sounds: Normal breath sounds. No stridor. No wheezing, rhonchi or rales.  Chest:     Chest wall: No tenderness.  Abdominal:     General: Bowel sounds are normal. There is no distension.     Palpations: Abdomen is soft. There is no hepatomegaly, splenomegaly or mass.     Tenderness: There is no abdominal tenderness. There is no right CVA tenderness, left CVA tenderness, guarding or rebound.     Hernia: No hernia is present.  Musculoskeletal:        General: No swelling, tenderness, deformity or signs of injury. Normal range of motion.     Cervical back: Normal range of motion and neck supple. No rigidity or tenderness.     Right lower leg: Edema (trace) present.     Left lower leg: Edema (trace) present.     Comments:    Lymphadenopathy:     Cervical: No cervical adenopathy.     Upper Body:     Right upper body: No supraclavicular or axillary adenopathy.     Left upper body: No supraclavicular or axillary adenopathy.     Lower Body: No right inguinal adenopathy. No left inguinal adenopathy.  Skin:    General: Skin is warm and dry.     Coloration: Skin is not jaundiced or pale.     Findings: No bruising, erythema, lesion or rash.  Neurological:      General: No focal deficit present.     Mental Status: He is alert and oriented to person, place, and time. Mental status is at baseline.     Cranial Nerves: No cranial nerve deficit.     Sensory: No sensory deficit.     Motor: No weakness.     Coordination: Coordination normal.     Gait: Gait normal.     Deep Tendon Reflexes: Reflexes normal.  Psychiatric:        Mood and Affect: Mood normal.        Behavior: Behavior normal.        Thought Content: Thought content normal.        Judgment: Judgment normal.     LABS:      Latest Ref Rng & Units 05/12/2022    9:43 AM 03/31/2022    8:52 AM 03/15/2022   11:06 AM  CBC  WBC 4.0 - 10.5 K/uL 3.5  3.1  3.6      Hemoglobin 13.0 - 17.0 g/dL 12.4  11.6  12.7      Hematocrit 39.0 - 52.0 % 37.5  34.8  36      Platelets 150 - 400 K/uL 182  186  163         This result is from an external source.      Latest Ref Rng & Units 05/12/2022    9:43 AM 03/31/2022    8:52 AM 03/15/2022   11:06 AM  CMP  Glucose 70 - 99 mg/dL 81  82    BUN 6 - 20 mg/dL 14  16  23      $ Creatinine 0.61 - 1.24 mg/dL 0.71  0.70  0.8      Sodium 135 - 145 mmol/L 139  138  134      Potassium 3.5 - 5.1 mmol/L 3.9  3.8  4.0      Chloride 98 - 111 mmol/L 103  103  99      CO2 22 - 32 mmol/L 26  27  28      $ Calcium 8.9 - 10.3 mg/dL 8.7  9.2  9.3      Total Protein 6.5 - 8.1 g/dL 6.5  6.3  Total Bilirubin 0.3 - 1.2 mg/dL 0.3  0.4    Alkaline Phos 38 - 126 U/L 50  44  72      AST 15 - 41 U/L 26  15  19      $ ALT 0 - 44 U/L 20  17  19         $ This result is from an external source.     No results found for: "CEA1", "CEA" / No results found for: "CEA1", "CEA" No results found for: "PSA1" No results found for: "[id number]" No results found for: "CAN125"  No results found for: "TOTALPROTELP", "ALBUMINELP", "A1GS", "A2GS", "BETS", "BETA2SER", "GAMS", "MSPIKE", "SPEI" Lab Results  Component Value Date   TIBC 354 05/18/2022   IRONPCTSAT 31 05/18/2022   No results found  for: "LDH"  STUDIES:  No results found.  EXAM: 03/02/2022 MRI OF THE NECK WITH CONTRAST IMPRESSION: 1. Postsurgical changes of left hemiglossectomy, tonsillectomy, and likely left neck dissection. There is ill defined T2 hyperintense signal in the region of the left submandibular gland at the area of abnormality seen on recent prior CT neck, but without a discrete soft tissue mass. There is also asymmetric T2 hyperintense signal abnormality and contrast enhancement in the left parapharyngeal space, surrounding the medial pterygoid and masseter muscle bodies, and along the posterior margin of the mylohyoid on the left. These findings are favored to represent evolving post-surgical changes, but close follow up is recommended to ensure continued resolution of signal abnormality and soft tissue mass seen on recent CT. 2. There is asymmetric contrast enhancement along the left aspect of the tongue. Findings are favored to represent post treatment change, with residual tumor not entirely excluded. Correlate with direct visualization. 3. Mild asymmetric T2 hyperintense signal and contrast enhancement within the paraspinal musculature on the left, nonspecific, possibly post-traumatic. 4. Chronic infarcts in the central pons, bilateral basal ganglia, and left parietal lobe. 5. Air-fluid level in the right sphenoid sinus. Correlate for symptoms of acute sinusitis.   CT soft tissue neck 02/03/2022   NM PET skull base to mid thigh 01/13/2022     Chest Xray 1 view 12/22/2021    HISTORY:   Past Medical History:  Diagnosis Date   Arthritis    Back pain    GERD (gastroesophageal reflux disease)    History of kidney stones    Hypertension    Sleep apnea    does not have cpap yet    Past Surgical History:  Procedure Laterality Date   APPENDECTOMY     HAND SURGERY     LUMBAR LAMINECTOMY/DECOMPRESSION MICRODISCECTOMY N/A 12/28/2017   Procedure: Laminectomy and Foraminotomy  Lumbar two-three-Lumbar three-four - Lumbar four-five;  Surgeon: Kary Kos, MD;  Location: [facility];  Service: Neurosurgery;  Laterality: N/A;   OTHER SURGICAL HISTORY     removal on cancer from neck    Family History  Problem Relation Age of Onset   Thyroid disease Mother    Heart disease Father    Prostate cancer Brother    Healthy Daughter    Healthy Son     Social History:  reports that he has been smoking cigarettes. He has a 22.00 pack-year smoking history. He has never used smokeless tobacco. He reports that he does not currently use alcohol. He reports that he does not use drugs.The patient is accompanied by his wife today.  Allergies: No Known Allergies  Current Medications: Current Outpatient Medications  Medication Sig Dispense Refill   HYDROcodone-acetaminophen (NORCO) 10-325 MG  tablet Take 1 tablet by mouth as directed.     amitriptyline (ELAVIL) 75 MG tablet Take 1 tablet by mouth daily.     amLODipine (NORVASC) 10 MG tablet Take 1 tablet by mouth every morning.     aspirin EC 81 MG tablet Take by mouth.     atorvastatin (LIPITOR) 80 MG tablet Take 80 mg by mouth daily.     chlorhexidine (PERIDEX) 0.12 % solution SMARTSIG:1 Capful(s) By Mouth Twice Daily     cyanocobalamin (VITAMIN B12) 1000 MCG/ML injection Inject 1 mL every month by subcutaneous route for 1 day.     dexamethasone 0.5 MG/5ML elixir Take by mouth.     fenofibrate 54 MG tablet TAKE ONE TABLET BY MOUTH EVERY DAY FOR 30 DAYS     folic acid (FOLVITE) 1 MG tablet Take 1 tablet (1 mg total) by mouth daily. 30 tablet 5   gabapentin (NEURONTIN) 800 MG tablet Take 800 mg by mouth 4 (four) times daily.     ketorolac (TORADOL) 10 MG tablet Take 10 mg by mouth every 6 (six) hours as needed.     lidocaine (XYLOCAINE) 2 % solution SMARTSIG:15 Milliliter(s) By Mouth Every 3 Hours PRN     magic mouthwash (nystatin, lidocaine, diphenhydrAMINE, alum & mag hydroxide) suspension Swish and swallow 15 mLs 4 (four) times daily  as needed for mouth pain.     magic mouthwash SOLN Take 15 mLs by mouth 4 (four) times daily as needed for mouth pain. 240 mL 5   meloxicam (MOBIC) 15 MG tablet Take 1 tablet (15 mg total) by mouth daily. 30 tablet 5   nitroGLYCERIN (NITROSTAT) 0.4 MG SL tablet PLACE 1 TABLET UNDER THE TONGUE EVERY 5 MIN FOR CHEST PAIN AS NEEDED. NO MORE THAN 3 TABS IN 15 MIN.     pantoprazole (PROTONIX) 40 MG tablet Take 1 tablet by mouth daily.     senna-docusate (SENOKOT-S) 8.6-50 MG tablet Take by mouth.     tiZANidine (ZANAFLEX) 4 MG tablet Take by mouth.     triamterene-hydrochlorothiazide (MAXZIDE-25) 37.5-25 MG tablet Take 1 tablet by mouth daily.     No current facility-administered medications for this visit.     I,Jasmine M Lassiter,acting as a scribe for Ian Kaplan, MD.,have documented all relevant documentation on the behalf of Ian Kaplan, MD,as directed by  Ian Kaplan, MD while in the presence of Ian Kaplan, MD.

## 2022-05-12 ENCOUNTER — Encounter: Payer: Self-pay | Admitting: Oncology

## 2022-05-12 ENCOUNTER — Telehealth: Payer: Self-pay

## 2022-05-12 ENCOUNTER — Inpatient Hospital Stay: Payer: Medicaid Other

## 2022-05-12 ENCOUNTER — Telehealth: Payer: Self-pay | Admitting: Oncology

## 2022-05-12 ENCOUNTER — Inpatient Hospital Stay: Payer: Medicaid Other | Attending: Hematology and Oncology | Admitting: Oncology

## 2022-05-12 ENCOUNTER — Other Ambulatory Visit: Payer: Self-pay | Admitting: Oncology

## 2022-05-12 VITALS — BP 141/80 | HR 74 | Temp 98.4°F | Resp 20 | Ht 69.0 in | Wt 259.9 lb

## 2022-05-12 DIAGNOSIS — K1120 Sialoadenitis, unspecified: Secondary | ICD-10-CM

## 2022-05-12 DIAGNOSIS — Z79899 Other long term (current) drug therapy: Secondary | ICD-10-CM | POA: Insufficient documentation

## 2022-05-12 DIAGNOSIS — C109 Malignant neoplasm of oropharynx, unspecified: Secondary | ICD-10-CM

## 2022-05-12 DIAGNOSIS — D6481 Anemia due to antineoplastic chemotherapy: Secondary | ICD-10-CM

## 2022-05-12 LAB — CBC WITH DIFFERENTIAL (CANCER CENTER ONLY)
Abs Immature Granulocytes: 0.09 10*3/uL — ABNORMAL HIGH (ref 0.00–0.07)
Basophils Absolute: 0 10*3/uL (ref 0.0–0.1)
Basophils Relative: 1 %
Eosinophils Absolute: 0.1 10*3/uL (ref 0.0–0.5)
Eosinophils Relative: 2 %
HCT: 37.5 % — ABNORMAL LOW (ref 39.0–52.0)
Hemoglobin: 12.4 g/dL — ABNORMAL LOW (ref 13.0–17.0)
Immature Granulocytes: 3 %
Lymphocytes Relative: 39 %
Lymphs Abs: 1.4 10*3/uL (ref 0.7–4.0)
MCH: 33.8 pg (ref 26.0–34.0)
MCHC: 33.1 g/dL (ref 30.0–36.0)
MCV: 102.2 fL — ABNORMAL HIGH (ref 80.0–100.0)
Monocytes Absolute: 0.5 10*3/uL (ref 0.1–1.0)
Monocytes Relative: 14 %
Neutro Abs: 1.5 10*3/uL — ABNORMAL LOW (ref 1.7–7.7)
Neutrophils Relative %: 41 %
Platelet Count: 182 10*3/uL (ref 150–400)
RBC: 3.67 MIL/uL — ABNORMAL LOW (ref 4.22–5.81)
RDW: 15.7 % — ABNORMAL HIGH (ref 11.5–15.5)
WBC Count: 3.5 10*3/uL — ABNORMAL LOW (ref 4.0–10.5)
nRBC: 0 % (ref 0.0–0.2)

## 2022-05-12 LAB — CMP (CANCER CENTER ONLY)
ALT: 20 U/L (ref 0–44)
AST: 26 U/L (ref 15–41)
Albumin: 3.6 g/dL (ref 3.5–5.0)
Alkaline Phosphatase: 50 U/L (ref 38–126)
Anion gap: 10 (ref 5–15)
BUN: 14 mg/dL (ref 6–20)
CO2: 26 mmol/L (ref 22–32)
Calcium: 8.7 mg/dL — ABNORMAL LOW (ref 8.9–10.3)
Chloride: 103 mmol/L (ref 98–111)
Creatinine: 0.71 mg/dL (ref 0.61–1.24)
GFR, Estimated: >60 mL/min (ref >60)
Glucose, Bld: 81 mg/dL (ref 70–99)
Potassium: 3.9 mmol/L (ref 3.5–5.1)
Sodium: 139 mmol/L (ref 135–145)
Total Bilirubin: 0.3 mg/dL (ref 0.3–1.2)
Total Protein: 6.5 g/dL (ref 6.5–8.1)

## 2022-05-12 MED ORDER — MELOXICAM 15 MG PO TABS: 15.0000 mg | ORAL_TABLET | Freq: Every day | ORAL | 5 refills | Status: AC

## 2022-05-12 NOTE — Telephone Encounter (Signed)
Medications updated

## 2022-05-12 NOTE — Telephone Encounter (Signed)
Patient has been scheduled for follow-up visit per 05/12/22 LOS.  Pt given an appt calendar with date and time.

## 2022-05-12 NOTE — Telephone Encounter (Signed)
-----   Message from [name], MD sent at 05/12/2022  1:37 PM EST ----- Regarding: meds He says he is not on oxycodone, he uses hydrocodone, I guess from Dr. Nance Pew (could look up previous dose) I am stopping the ibuprofen and trying meloxicam

## 2022-05-18 ENCOUNTER — Other Ambulatory Visit: Payer: Self-pay | Admitting: Oncology

## 2022-05-18 DIAGNOSIS — C109 Malignant neoplasm of oropharynx, unspecified: Secondary | ICD-10-CM | POA: Diagnosis not present

## 2022-05-18 DIAGNOSIS — D702 Other drug-induced agranulocytosis: Secondary | ICD-10-CM | POA: Insufficient documentation

## 2022-05-18 DIAGNOSIS — D6481 Anemia due to antineoplastic chemotherapy: Secondary | ICD-10-CM

## 2022-05-18 LAB — IRON AND TIBC
Iron: 108 ug/dL (ref 45–182)
Saturation Ratios: 31 % (ref 17.9–39.5)
TIBC: 354 ug/dL (ref 250–450)
UIBC: 246 ug/dL

## 2022-05-18 LAB — FOLATE: Folate: 5.1 ng/mL — ABNORMAL LOW

## 2022-05-18 LAB — VITAMIN B12: Vitamin B-12: 327 pg/mL (ref 180–914)

## 2022-05-19 ENCOUNTER — Other Ambulatory Visit: Payer: Self-pay | Admitting: Oncology

## 2022-05-19 ENCOUNTER — Telehealth: Payer: Self-pay

## 2022-05-19 DIAGNOSIS — D529 Folate deficiency anemia, unspecified: Secondary | ICD-10-CM | POA: Insufficient documentation

## 2022-05-19 MED ORDER — FOLIC ACID 1 MG PO TABS: 1.0000 mg | ORAL_TABLET | Freq: Every day | ORAL | 5 refills | Status: DC

## 2022-05-19 NOTE — Telephone Encounter (Signed)
-----   Message from [name], MD sent at 05/19/2022  9:09 AM EST ----- Regarding: call Tell him labs last week looked good. His anemia is improved. B12 and iron are good but folate is low. This is a vitamin that helps make red cells.  I will send in Rx to take 1 daily.

## 2022-05-19 NOTE — Telephone Encounter (Signed)
Patient notified of results.

## 2022-05-24 ENCOUNTER — Encounter: Payer: Self-pay | Admitting: Oncology

## 2022-06-09 ENCOUNTER — Telehealth: Payer: Self-pay

## 2022-06-09 NOTE — Telephone Encounter (Signed)
Pt's wife called to ask, "can he have his scan done sooner than scheduled (April)? He still has swelling on left side of face and jaw. He has rawness in the back of his tongue. He still has headache". I asked what he was taking for pain. She replied, "hydrocodone, mobic and muscle relaxant. None of it really helps".

## 2022-06-14 ENCOUNTER — Telehealth: Payer: Self-pay | Admitting: Oncology

## 2022-06-14 NOTE — Telephone Encounter (Signed)
CT Neck has been scheduled for 06/23/22 @ 11:30; Check in @ 10:30   Notified pt of date,time and instructions.

## 2022-06-15 ENCOUNTER — Encounter: Payer: Self-pay | Admitting: Oncology

## 2022-06-21 LAB — HM COLONOSCOPY

## 2022-06-23 LAB — HEPATIC FUNCTION PANEL
ALT: 25 U/L (ref 10–40)
AST: 33 (ref 14–40)
Alkaline Phosphatase: 78 (ref 25–125)
Bilirubin, Total: 0.4

## 2022-06-23 LAB — BASIC METABOLIC PANEL
BUN: 14 (ref 4–21)
CO2: 26 — AB (ref 13–22)
Chloride: 103 (ref 99–108)
Creatinine: 0.6 (ref 0.6–1.3)
Glucose: 100
Potassium: 3.8 mEq/L (ref 3.5–5.1)
Sodium: 136 — AB (ref 137–147)

## 2022-06-23 LAB — CBC AND DIFFERENTIAL
HCT: 40 — AB (ref 41–53)
Hemoglobin: 13.6 (ref 13.5–17.5)
Neutrophils Absolute: 1.6
Platelets: 215 10*3/uL (ref 150–400)
WBC: 3.4

## 2022-06-23 LAB — CBC: RBC: 4 (ref 3.87–5.11)

## 2022-06-23 LAB — COMPREHENSIVE METABOLIC PANEL
Albumin: 4.3 (ref 3.5–5.0)
Calcium: 9.5 (ref 8.7–10.7)

## 2022-06-24 ENCOUNTER — Inpatient Hospital Stay: Payer: Medicaid Other | Attending: Hematology and Oncology | Admitting: Oncology

## 2022-06-24 ENCOUNTER — Encounter: Payer: Self-pay | Admitting: Oncology

## 2022-06-24 VITALS — BP 151/86 | HR 85 | Temp 98.5°F | Resp 20 | Ht 69.0 in | Wt 255.7 lb

## 2022-06-24 DIAGNOSIS — C109 Malignant neoplasm of oropharynx, unspecified: Secondary | ICD-10-CM

## 2022-06-24 NOTE — Progress Notes (Signed)
[facility]  [address] 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 This is p16 negative. He has several concerning features on his pathology, including 1 positive node with extranodal extension and focal lymphovascular invasion. He was healing well, and then developed left facial pain and swelling, this occurred prior to initiating any chemoradiation. CT scan was nonspecific. MRI scan did not show any mass affect and was consistent with inflammation of the area. He has now completed his full course of radiation along with 7 cycles of chemotherapy with carboplatin and taxol as of  December, 2023. His latest scans are considered NIRADS 2a but do show some thickening along the left tongue. This is likely post op but I think he needs to be scoped to be sure.   Painful left facial swelling  We can see soft tissue swelling on the CT scan and originally thought that this represented parotiditis. We had him initially on 1 antibiotic then changed to Clindamycin but neither helped. We asked his ENT surgeon in [employer] for an opinion and he did not seem to know what this was either. He recommended we proceed with treatment and so we have. This does not seem to be spread of his throat cancer but did not respond to antibiotics and is very erythematous and extremely tender, and fluctuates up and down. I also tried a course of steroids which had very little benefit. I recommended a referral to a tertiary care center ENT and he was seen by Dr. Avon Gully at [facility].  He feels this represents postoperative changes that are just more pronounced than usual. He is already on multiple medications for pain control including Gabapentin at high doses, Tizanidine,  Amitriptyline, Ibuprofen, ketorolac, and PRN hydrocodone. The latest scan still looks consistent with post treatment changes.   Chronic Back Pain Spinal stenosis with 4 back surgeries including vertebral fusion. He has chronic pain and neuropathy, and is followed by [facility].   CVA, 2023 He still has some residual right upper extremity paresthesias. He had carotid angioplasty on the left and we do see some chronic infarctions in the pons and basal ganglia.   Tobacco Abuse    Plan: He continues to have persistent severe left-sided facial swelling and pain but this has improved somewhat.  He is still miserable and has a poor quality of life due to this problem.  He continues to take Maud powders as this helps partially. I will schedule him with Dr.Carrie Wynetta Emery to further evaluate his pain and swelling. I will also request Dr.Lancaster see him again for follow up and nasopharyngoscopy or any other recommendations.  His teeth are in bad shape and he needs to see a dentist if we can find 1 that will take his insurance.  He may need total extractions and dentures.  He will return in 2-3 months with CBC, CMP. The patient was provided an opportunity to ask questions and all were answered.  The patient and wife agreed with the plan and demonstrated an understanding of the instructions.  The patient was advised to call back if the symptoms worsen or if the condition fails to improve as anticipated.  I provided 30 minutes of face-to-face time during this encounter and > 50% was spent counseling as documented  under my assessment and plan.    [name], MD  [place] [address] [place] [address] Dept: [phone] Dept Fax: [phone]   No orders of the defined types were placed in this encounter.   CHIEF COMPLAINT:  CC: Stage IVB oropharynx cancer  Current Treatment: Adjuvant chemoradiation with weekly  carboplatin/paclitaxel  HISTORY OF PRESENT ILLNESS:   Oncology History  Oropharyngeal carcinoma ([place])  11/06/2021 Initial Diagnosis   Oropharyngeal carcinoma ([place])   11/06/2021 Cancer Staging   Staging form: Pharynx - P16 Negative Oropharynx, AJCC 8th Edition - Clinical stage from 11/06/2021: Stage IVB (cT1, cN3, cM0, p16-) - Signed by [name], MD on 12/28/2021 Histopathologic type: Squamous cell carcinoma, NOS Stage prefix: Initial diagnosis Histologic grade (G): G2 Histologic grading system: 4 grade system Tumor size (mm): 13 Lymph-vascular invasion (LVI): LVI present/identified, NOS Diagnostic confirmation: Positive histology Specimen type: Excision Staged by: Managing physician Presence of extranodal extension: Present Extent of extranodal extension: Microscopic ENE(+) Distance of extension from the native lymph node capsule to the farthest point of invasion in the extranodal tissue (mm): 6.5 ECOG performance status: Grade 1 Perineural invasion (PNI): Absent Prognostic indicators: 1.3 cm with 1/23 nodes positive -2.3 cm with ENE and focal LVI Stage used in treatment planning: Yes National guidelines used in treatment planning: Yes Type of national guideline used in treatment planning: NCCN   01/06/2022 - 03/16/2022 Chemotherapy   Patient is on Treatment Plan : HEAD/NECK Carboplatin + Paclitaxel + XRT q7d     01/19/2022 Imaging   CT neck:  IMPRESSION:  1. 26 x 11 x 11 mm soft tissue mass posterior to the left  submandibular gland and anterior to the SCM. Some residual soft  tissue is noted in the PET scan. While this may represent  postoperative scar tissue, it is concerning for residual or  recurrent tumor.  2. Asymmetric soft tissue at the left glossal tonsillar sulcus with  focal surgical clips. No definite mass lesion is present.  3. Left carotid stent is in place. Vessel appears patent.  4. Atherosclerotic calcifications at the right carotid bifurcation   with what appears to be a high-grade proximal right ICA stenosis.  5. Right IJ Port-A-Cath in place.        INTERVAL HISTORY:  Ian Case is here today for repeat clinical assessment for Stage IVB oropharynx cancer. CT scan of the neck reveals more thickening around the tongue. Vocal cords are normal, overall it generally shows post treatment changes. NIRADS 2a. He notes he hasn't been too well due to the pain. He notes he has headaches, constant tinnitus, and he notices swelling after laying down all night. He has constant pain of the left face, and periauricular area. This is very tender to palpation and he has extensive edema of the left neck. He is so miserable, that it is affecting his quality of life and he is unable to do the things he would like. He states the Meloxicam does not help. He is already on numerous pain medications and has been for years and the only thing that partially helps is Goody powders. I have suggested he should see Johnny Bridge, who can possibly treat his pain. We will try and schedule him to see her soon. He saw an ear specialist Dr.Ma,  who suggested hearing aids and his right ear is at 30%. He had a colonoscopy done by Dr.M[place]head, which was clear.  He denies signs of  infection such as sore throat, sinus drainage, cough, or urinary symptoms.  He denies fevers or recurrent chills. He denies pain. He denies nausea, vomiting, chest pain, dyspnea or cough. His appetite is good and his weight decreased 4 pounds since his last visit. He is accompanied by his wife for today's visit.   REVIEW OF SYSTEMS:  Review of Systems  Constitutional: Negative.  Negative for appetite change, chills, diaphoresis, fatigue, fever and unexpected weight change.  HENT:   Positive for mouth sores and trouble swallowing. Negative for hearing loss, lump/mass, nosebleeds, sore throat, tinnitus and voice change.        + left ear pain intermittently + left jaw pain + left facial swelling + sore in  the left posterior throat  Eyes: Negative.  Negative for eye problems and icterus.  Respiratory: Negative.  Negative for chest tightness, cough, hemoptysis, shortness of breath and wheezing.   Cardiovascular: Negative.  Negative for chest pain, leg swelling and palpitations.  Gastrointestinal: Negative.  Negative for abdominal distention, abdominal pain, blood in stool, constipation, diarrhea, nausea, rectal pain and vomiting.  Endocrine: Negative.  Negative for hot flashes.  Genitourinary: Negative.  Negative for bladder incontinence, difficulty urinating, dyspareunia, dysuria, frequency, hematuria, nocturia, pelvic pain and penile discharge.   Musculoskeletal:  Positive for arthralgias, back pain and myalgias. Negative for flank pain, gait problem, neck pain and neck stiffness.  Skin: Negative.  Negative for itching, rash and wound.  Neurological: Negative.  Negative for dizziness, extremity weakness, gait problem, headaches, light-headedness, numbness, seizures and speech difficulty.  Hematological: Negative.  Negative for adenopathy. Does not bruise/bleed easily.  Psychiatric/Behavioral: Negative.  Negative for confusion, decreased concentration, depression, sleep disturbance and suicidal ideas. The patient is not nervous/anxious.      VITALS:  Blood pressure (!) 151/86, pulse 85, temperature 98.5 F (36.9 C), temperature source Oral, resp. rate 20, height 5\' 9"  (1.753 m), weight 255 lb 11.2 oz (116 kg), SpO2 99 %.  Wt Readings from Last 3 Encounters:  06/24/22 255 lb 11.2 oz (116 kg)  05/12/22 259 lb 14.4 oz (117.9 kg)  03/31/22 250 lb (113.4 kg)    Body mass index is 37.76 kg/m.  Performance status (ECOG): 1 - Symptomatic but completely ambulatory  PHYSICAL EXAM:  Physical Exam Vitals and nursing note reviewed. Exam conducted with a chaperone present.  Constitutional:      General: He is not in acute distress.    Appearance: Normal appearance. He is normal weight. He is not  ill-appearing, toxic-appearing or diaphoretic.  HENT:     Head: Normocephalic and atraumatic.     Comments: Large scar left neck. Swelling and erythema of left face which is mildly improved. Mild edema of anterior bilateral neck areas but these are not tender or erythematous and are consistent with lymphedema.     Right Ear: Tympanic membrane, ear canal and external ear normal. There is no impacted cerumen.     Left Ear: Tympanic membrane, ear canal and external ear normal. There is no impacted cerumen.     Nose: Nose normal. No congestion or rhinorrhea.     Mouth/Throat:     Mouth: Mucous membranes are moist.     Pharynx: Oropharynx is clear. No oropharyngeal exudate or posterior oropharyngeal erythema.  Eyes:     General: No scleral icterus.       Right eye: No discharge.        Left eye: No discharge.     Extraocular Movements: Extraocular movements intact.  Conjunctiva/sclera: Conjunctivae normal.     Pupils: Pupils are equal, round, and reactive to light.  Neck:     Vascular: No carotid bruit.     Comments: Lots of erythema swelling of the left neck, large scars, swelling of the face  He has lymphedema of the left neck..  Cardiovascular:     Rate and Rhythm: Normal rate and regular rhythm.     Pulses: Normal pulses.     Heart sounds: Normal heart sounds. No murmur heard.    No friction rub. No gallop.  Pulmonary:     Effort: Pulmonary effort is normal. No respiratory distress.     Breath sounds: Normal breath sounds. No stridor. No wheezing, rhonchi or rales.  Chest:     Chest wall: No tenderness.  Abdominal:     General: Bowel sounds are normal. There is no distension.     Palpations: Abdomen is soft. There is no hepatomegaly, splenomegaly or mass.     Tenderness: There is no abdominal tenderness. There is no right CVA tenderness, left CVA tenderness, guarding or rebound.     Hernia: No hernia is present.  Musculoskeletal:        General: No swelling, tenderness,  deformity or signs of injury. Normal range of motion.     Cervical back: Normal range of motion and neck supple. No rigidity or tenderness.     Right lower leg: Edema (trace) present.     Left lower leg: Edema (trace) present.     Comments:    Lymphadenopathy:     Cervical: No cervical adenopathy.     Upper Body:     Right upper body: No supraclavicular or axillary adenopathy.     Left upper body: No supraclavicular or axillary adenopathy.     Lower Body: No right inguinal adenopathy. No left inguinal adenopathy.  Skin:    General: Skin is warm and dry.     Coloration: Skin is not jaundiced or pale.     Findings: No bruising, erythema, lesion or rash.  Neurological:     General: No focal deficit present.     Mental Status: He is alert and oriented to person, place, and time. Mental status is at baseline.     Cranial Nerves: No cranial nerve deficit.     Sensory: No sensory deficit.     Motor: No weakness.     Coordination: Coordination normal.     Gait: Gait normal.     Deep Tendon Reflexes: Reflexes normal.  Psychiatric:        Mood and Affect: Mood normal.        Behavior: Behavior normal.        Thought Content: Thought content normal.        Judgment: Judgment normal.     LABS:      Latest Ref Rng & Units 06/23/2022   12:00 AM 05/12/2022    9:43 AM 03/31/2022    8:52 AM  CBC  WBC  3.4     3.5  3.1   Hemoglobin 13.5 - 17.5 13.6     12.4  11.6   Hematocrit 41 - 53 40     37.5  34.8   Platelets 150 - 400 K/uL 215     182  186      This result is from an external source.      Latest Ref Rng & Units 06/23/2022   12:00 AM 05/12/2022    9:43 AM 03/31/2022  8:52 AM  CMP  Glucose 70 - 99 mg/dL  81  82   BUN 4 - 21 14     14  16    Creatinine 0.6 - 1.3 0.6     0.71  0.70   Sodium 137 - 147 136     139  138   Potassium 3.5 - 5.1 mEq/L 3.8     3.9  3.8   Chloride 99 - 108 103     103  103   CO2 13 - 22 26     26  27    Calcium 8.7 - 10.7 9.5     8.7  9.2   Total  Protein 6.5 - 8.1 g/dL  6.5  6.3   Total Bilirubin 0.3 - 1.2 mg/dL  0.3  0.4   Alkaline Phos 25 - 125 78     50  44   AST 14 - 40 33     26  15   ALT 10 - 40 U/L 25     20  17       This result is from an external source.     No results found for: "CEA1", "CEA" / No results found for: "CEA1", "CEA" No results found for: "PSA1" No results found for: "[id number]" No results found for: "CAN125"  No results found for: "TOTALPROTELP", "ALBUMINELP", "A1GS", "A2GS", "BETS", "BETA2SER", "GAMS", "MSPIKE", "SPEI" Lab Results  Component Value Date   TIBC 354 05/18/2022   IRONPCTSAT 31 05/18/2022   No results found for: "LDH"  STUDIES:  No results found.  EXAM: 03/02/2022 MRI OF THE NECK WITH CONTRAST IMPRESSION: 1. Postsurgical changes of left hemiglossectomy, tonsillectomy, and likely left neck dissection. There is ill defined T2 hyperintense signal in the region of the left submandibular gland at the area of abnormality seen on recent prior CT neck, but without a discrete soft tissue mass. There is also asymmetric T2 hyperintense signal abnormality and contrast enhancement in the left parapharyngeal space, surrounding the medial pterygoid and masseter muscle bodies, and along the posterior margin of the mylohyoid on the left. These findings are favored to represent evolving post-surgical changes, but close follow up is recommended to ensure continued resolution of signal abnormality and soft tissue mass seen on recent CT. 2. There is asymmetric contrast enhancement along the left aspect of the tongue. Findings are favored to represent post treatment change, with residual tumor not entirely excluded. Correlate with direct visualization. 3. Mild asymmetric T2 hyperintense signal and contrast enhancement within the paraspinal musculature on the left, nonspecific, possibly post-traumatic. 4. Chronic infarcts in the central pons, bilateral basal ganglia, and left parietal lobe. 5.  Air-fluid level in the right sphenoid sinus. Correlate for symptoms of acute sinusitis.   CT soft tissue neck 02/03/2022   NM PET skull base to mid thigh 01/13/2022     Chest Xray 1 view 12/22/2021    HISTORY:   Past Medical History:  Diagnosis Date   Arthritis    Back pain    GERD (gastroesophageal reflux disease)    History of kidney stones    Hypertension    Sleep apnea    does not have cpap yet    Past Surgical History:  Procedure Laterality Date   APPENDECTOMY     HAND SURGERY     LUMBAR LAMINECTOMY/DECOMPRESSION MICRODISCECTOMY N/A 12/28/2017   Procedure: Laminectomy and Foraminotomy Lumbar two-three-Lumbar three-four - Lumbar four-five;  Surgeon: Kary Kos, MD;  Location: [facility];  Service: Neurosurgery;  Laterality: N/A;  OTHER SURGICAL HISTORY     removal on cancer from neck    Family History  Problem Relation Age of Onset   Thyroid disease Mother    Heart disease Father    Prostate cancer Brother    Healthy Daughter    Healthy Son     Social History:  reports that he has been smoking cigarettes. He has a 22.00 pack-year smoking history. He has never used smokeless tobacco. He reports that he does not currently use alcohol. He reports that he does not use drugs.The patient is accompanied by his wife today.  Allergies: No Known Allergies  Current Medications: Current Outpatient Medications  Medication Sig Dispense Refill   amitriptyline (ELAVIL) 75 MG tablet Take 1 tablet by mouth daily.     amLODipine (NORVASC) 10 MG tablet Take 1 tablet by mouth every morning.     aspirin EC 81 MG tablet Take by mouth.     atorvastatin (LIPITOR) 80 MG tablet Take 80 mg by mouth daily.     chlorhexidine (PERIDEX) 0.12 % solution SMARTSIG:1 Capful(s) By Mouth Twice Daily     cyanocobalamin (VITAMIN B12) 1000 MCG/ML injection Inject 1 mL every month by subcutaneous route for 1 day.     dexamethasone 0.5 MG/5ML elixir Take by mouth.     fenofibrate 54 MG tablet TAKE  ONE TABLET BY MOUTH EVERY DAY FOR 30 DAYS     folic acid (FOLVITE) 1 MG tablet Take 1 tablet (1 mg total) by mouth daily. 30 tablet 5   gabapentin (NEURONTIN) 800 MG tablet Take 800 mg by mouth 4 (four) times daily.     HYDROcodone-acetaminophen (NORCO) 10-325 MG tablet Take 1 tablet by mouth as directed.     ketorolac (TORADOL) 10 MG tablet Take 10 mg by mouth every 6 (six) hours as needed.     lidocaine (XYLOCAINE) 2 % solution SMARTSIG:15 Milliliter(s) By Mouth Every 3 Hours PRN     magic mouthwash (nystatin, lidocaine, diphenhydrAMINE, alum & mag hydroxide) suspension Swish and swallow 15 mLs 4 (four) times daily as needed for mouth pain.     magic mouthwash SOLN Take 15 mLs by mouth 4 (four) times daily as needed for mouth pain. 240 mL 5   meloxicam (MOBIC) 15 MG tablet Take 1 tablet (15 mg total) by mouth daily. 30 tablet 5   nitroGLYCERIN (NITROSTAT) 0.4 MG SL tablet PLACE 1 TABLET UNDER THE TONGUE EVERY 5 MIN FOR CHEST PAIN AS NEEDED. NO MORE THAN 3 TABS IN 15 MIN.     pantoprazole (PROTONIX) 40 MG tablet Take 1 tablet by mouth daily.     senna-docusate (SENOKOT-S) 8.6-50 MG tablet Take by mouth.     tiZANidine (ZANAFLEX) 4 MG tablet Take by mouth.     triamterene-hydrochlorothiazide (MAXZIDE-25) 37.5-25 MG tablet Take 1 tablet by mouth daily.     No current facility-administered medications for this visit.      I,Gabriella Ballesteros,acting as a scribe for [name], MD.,have documented all relevant documentation on the behalf of [name], MD,as directed by  [name], MD while in the presence of [name], MD.

## 2022-06-25 ENCOUNTER — Telehealth: Payer: Self-pay | Admitting: Oncology

## 2022-06-25 ENCOUNTER — Telehealth: Payer: Self-pay

## 2022-06-25 NOTE — Telephone Encounter (Signed)
Referral sent electronically to [facility] and Denture in [place].

## 2022-06-25 NOTE — Telephone Encounter (Signed)
-----   Message from [name], MD sent at 06/24/2022  7:39 PM EDT ----- Regarding: Dentist He is having severe problems with his teeth since radiation and may need extractions and dentures. Is there local dentist that could see him with Medicare and Medicaid?

## 2022-06-25 NOTE — Telephone Encounter (Signed)
-----   Message from [name], MD sent at 06/24/2022  7:37 PM EDT ----- Regarding: refer Pls refer back to Dr. Avon Gully in [place], he saw in consult last year and I think pt needs to be scoped to f/u on the tongue thickening seen on recent CT, or if he recommends other imaging.

## 2022-06-25 NOTE — Telephone Encounter (Signed)
-----   Message from [name], MD sent at 06/24/2022  7:34 PM EDT ----- Regarding: refer Pls refer to Johnny Bridge, Pain specialist.  I don't have her contact info but Adonis Huguenin may have, Marlynn Perking used to refer to her a lot.  I think [place] He has constant unrelieved pain of left face despite many medications and the only thing that helps some is Goody powders. We have recent scan and he has had several, but if she recommends other imaging, I am glad to order.

## 2022-06-25 NOTE — Telephone Encounter (Signed)
06/25/22 Next appt scheduled and confirmed with patient

## 2022-06-25 NOTE — Telephone Encounter (Addendum)
Referral sent to [facility] for Dr. Johnny Bridge electronically

## 2022-06-25 NOTE — Telephone Encounter (Signed)
Referral sent. Also was referred back to Dr. Avon Gully in [facility]

## 2022-07-02 ENCOUNTER — Encounter: Payer: Self-pay | Admitting: Oncology

## 2022-07-14 ENCOUNTER — Ambulatory Visit: Payer: Medicaid Other | Admitting: Oncology

## 2022-08-25 ENCOUNTER — Other Ambulatory Visit: Payer: Self-pay | Admitting: Oncology

## 2022-08-25 ENCOUNTER — Telehealth: Payer: Self-pay

## 2022-08-25 NOTE — Telephone Encounter (Signed)
Pt saw Dr [name] yesterday. He told them Neko needs another swallowing test. She wants to know if you could set that up?

## 2022-08-26 ENCOUNTER — Encounter: Payer: Self-pay | Admitting: Oncology

## 2022-08-26 ENCOUNTER — Telehealth: Payer: Self-pay

## 2022-08-26 NOTE — Telephone Encounter (Signed)
-----   Message from Dellia Beckwith, MD sent at 08/25/2022  5:35 PM EDT ----- Regarding: test He needs a swallowing eval, not sure who schedules, but I think goes through Speech therapy

## 2022-08-26 NOTE — Telephone Encounter (Signed)
Referral waiting on Dr. Gilman Buttner signature. Sending to [facility] for Swallowing evaluation/treat

## 2022-09-08 ENCOUNTER — Encounter: Payer: Self-pay | Admitting: Oncology

## 2022-09-10 NOTE — Progress Notes (Signed)
[name]  [address] 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 This is p16 negative. He has several concerning features on his pathology, including 1 positive node with extranodal extension and focal lymphovascular invasion. He was healing well, and then developed left facial pain and swelling, this occurred prior to initiating any chemoradiation. CT scan was nonspecific. MRI scan did not show any mass affect and was consistent with inflammation of the area. He has now completed his full course of radiation along with 7 cycles of chemotherapy with carboplatin and taxol as of  December, 2023. His latest scans are considered NIRADS 2a but do show some thickening along the left tongue. This is likely post op but I think he needs to be scoped to be sure.   Painful left facial swelling  We can see soft tissue swelling on the CT scan and originally thought that this represented parotiditis. We had him initially on 1 antibiotic then changed to Clindamycin but neither helped. We asked his ENT surgeon in [name] for an opinion and he did not seem to know what this was either. He recommended we proceed with treatment and so we have. This does not seem to be spread of his throat cancer but did not respond to antibiotics and is very erythematous and extremely tender, and fluctuates up and down. I also tried a course of steroids which had very little benefit. I recommended a referral to a tertiary care center ENT and he was seen by Dr. [name] at [facility].  He feels this represents postoperative changes that are just more pronounced than usual. He is already on multiple medications for pain control including Gabapentin at high doses, Tizanidine,  Amitriptyline, Ibuprofen, ketorolac, and PRN hydrocodone. The latest scan still looks consistent with post treatment changes.   Chronic Back Pain Spinal stenosis with 4 back surgeries including vertebral fusion. He has chronic pain and neuropathy, and is followed by [facility].   CVA, 2023 He still has some residual right upper extremity paresthesias. He had carotid angioplasty on the left and we do see some chronic infarctions in the pons and basal ganglia.   Tobacco Abuse    Plan: He saw an ear specialist Dr.Ma,  who suggested hearing aids and his right ear is at 30%. He had a colonoscopy done by Dr.Moorhead, which was clear. He had a MBSS on 09/07/2022 that was fairly normal but he did have some aspiration episodes. They did not recommend diet restrictions but to use thin liquids and reviewed swallowing strategies with him. He has mild to moderate oropharyngeal dysphagia which has slightly worsened since his cancer therapy.  I sent a referral to [name] for pain management on 06/25/2022 but he never heard from them, so I will have my nurse [name] follow-up on that. He will see Dr. [name] on 09/22/2022 for a nasopharyngoscopy. His last CT neck was in March of 2024 and he will be due for a repeat CT scan by his next appointment, unless Dr. [name] feels we should do it sooner or recommends an MRI instead. His labs today are pending. I will see him back in 3 months with CBC, CMP, and CT of neck and head 2 days prior to his appointment with me. The patient was provided  an opportunity to ask questions and all were answered.  The patient and wife agreed with the plan and demonstrated an understanding of the instructions.  The patient was advised to call back if the symptoms worsen or if the condition fails to improve as anticipated.   I provided 24 minutes of face-to-face time during this encounter and > 50% was spent counseling as documented under my assessment and plan.    Dellia Beckwith,  MD  [name] [address] [place] [address] Dept: [phone] Dept Fax: [phone]   No orders of the defined types were placed in this encounter.   CHIEF COMPLAINT:  CC: Stage IVB oropharynx cancer  Current Treatment: Surveillance  HISTORY OF PRESENT ILLNESS:   Oncology History  Oropharyngeal carcinoma (HCC)  11/06/2021 Initial Diagnosis   Oropharyngeal carcinoma (HCC)   11/06/2021 Cancer Staging   Staging form: Pharynx - P16 Negative Oropharynx, AJCC 8th Edition - Clinical stage from 11/06/2021: Stage IVB (cT1, cN3, cM0, p16-) - Signed by Dellia Beckwith, MD on 12/28/2021 Histopathologic type: Squamous cell carcinoma, NOS Stage prefix: Initial diagnosis Histologic grade (G): G2 Histologic grading system: 4 grade system Tumor size (mm): 13 Lymph-vascular invasion (LVI): LVI present/identified, NOS Diagnostic confirmation: Positive histology Specimen type: Excision Staged by: Managing physician Presence of extranodal extension: Present Extent of extranodal extension: Microscopic ENE(+) Distance of extension from the native lymph node capsule to the farthest point of invasion in the extranodal tissue (mm): 6.5 ECOG performance status: Grade 1 Perineural invasion (PNI): Absent Prognostic indicators: 1.3 cm with 1/23 nodes positive -2.3 cm with ENE and focal LVI Stage used in treatment planning: Yes National guidelines used in treatment planning: Yes Type of national guideline used in treatment planning: NCCN   01/06/2022 - 03/16/2022 Chemotherapy   Patient is on Treatment Plan : HEAD/NECK Carboplatin + Paclitaxel + XRT q7d     01/19/2022 Imaging   CT neck:  IMPRESSION:  1. 26 x 11 x 11 mm soft tissue mass posterior to the left  submandibular gland and anterior to the SCM. Some residual soft  tissue is noted in the PET scan. While this may represent  postoperative scar tissue, it is concerning  for residual or  recurrent tumor.  2. Asymmetric soft tissue at the left glossal tonsillar sulcus with  focal surgical clips. No definite mass lesion is present.  3. Left carotid stent is in place. Vessel appears patent.  4. Atherosclerotic calcifications at the right carotid bifurcation  with what appears to be a high-grade proximal right ICA stenosis.  5. Right IJ Port-A-Cath in place.        INTERVAL HISTORY:  Ian Case is here today for repeat clinical assessment for Stage IVB oropharynx cancer. Patient states that he is ok and complains of back pain throughout his whole back rating a 5/10. His swallowing isn't as bad as usual but still persistent.  He saw an ear specialist Dr.Ma,  who suggested hearing aids and his right ear is at 30%. He had a colonoscopy done by Dr.Moorhead, which was clear. He had a MBSS on 09/07/2022 that was fairly normal but he did have some aspiration episodes. They did not recommend diet restrictions but to use thin liquids and reviewed swallowing strategies with him. He has mild to moderate oropharyngeal dysphagia which has slightly worsened since his cancer therapy.  I sent a referral to [name] for pain management on 06/25/2022 but he never heard from  them, so I will have my nurse [name] follow-up on that. He will see Dr. [name] on 09/22/2022 for a nasopharyngoscopy. His last CT neck was in March of 2024 and he will be due for a repeat CT scan by his next appointment, unless Dr. [name] feels we should do it sooner or recommends an MRI instead. His labs today are pending. I will see him back in 3 months with CBC, CMP, and CT of neck and head 2 days prior to his appointment with me. He denies signs of infection such as sore throat, sinus drainage, cough, or urinary symptoms.  He denies fevers or recurrent chills. He denies pain. He denies nausea, vomiting, chest pain, dyspnea or cough. His appetite is good and his weight has decreased 8 pounds over last 2.5  months .  REVIEW OF SYSTEMS:  Review of Systems  Constitutional: Negative.  Negative for appetite change, chills, diaphoresis, fatigue, fever and unexpected weight change.       Intentional weight loss  HENT:   Positive for trouble swallowing. Negative for hearing loss, lump/mass, mouth sores, nosebleeds, sore throat, tinnitus and voice change.        + left ear pain intermittently + left jaw pain + left facial swelling  Eyes: Negative.  Negative for eye problems and icterus.  Respiratory: Negative.  Negative for chest tightness, cough, hemoptysis, shortness of breath and wheezing.   Cardiovascular: Negative.  Negative for chest pain, leg swelling and palpitations.  Gastrointestinal: Negative.  Negative for abdominal distention, abdominal pain, blood in stool, constipation, diarrhea, nausea, rectal pain and vomiting.  Endocrine: Negative.  Negative for hot flashes.  Genitourinary: Negative.  Negative for bladder incontinence, difficulty urinating, dyspareunia, dysuria, frequency, hematuria, nocturia, pelvic pain and penile discharge.   Musculoskeletal:  Positive for arthralgias, back pain (5/10) and myalgias. Negative for flank pain, gait problem, neck pain and neck stiffness.  Skin: Negative.  Negative for itching, rash and wound.  Neurological: Negative.  Negative for dizziness, extremity weakness, gait problem, headaches, light-headedness, numbness, seizures and speech difficulty.  Hematological: Negative.  Negative for adenopathy. Does not bruise/bleed easily.  Psychiatric/Behavioral: Negative.  Negative for confusion, decreased concentration, depression, sleep disturbance and suicidal ideas. The patient is not nervous/anxious.      VITALS:  Blood pressure (!) 141/80, pulse 87, temperature 98.6 F (37 C), temperature source Oral, resp. rate 18, height 5\' 9"  (1.753 m), weight 247 lb 8 oz (112.3 kg), SpO2 99 %.  Wt Readings from Last 3 Encounters:  09/15/22 247 lb 8 oz (112.3 kg)   06/24/22 255 lb 11.2 oz (116 kg)  05/12/22 259 lb 14.4 oz (117.9 kg)    Body mass index is 36.55 kg/m.  Performance status (ECOG): 1 - Symptomatic but completely ambulatory  PHYSICAL EXAM:  Physical Exam Vitals and nursing note reviewed. Exam conducted with a chaperone present.  Constitutional:      General: He is not in acute distress.    Appearance: Normal appearance. He is normal weight. He is not ill-appearing, toxic-appearing or diaphoretic.  HENT:     Head: Normocephalic and atraumatic.     Comments: Swelling and erythema of left face which is mildly improved. Mild edema of anterior bilateral neck areas but these are not tender or erythematous and are consistent with lymphedema.     Right Ear: Tympanic membrane, ear canal and external ear normal. There is no impacted cerumen.     Left Ear: Tympanic membrane, ear canal and external ear normal. There is no  impacted cerumen.     Nose: Nose normal. No congestion or rhinorrhea.     Mouth/Throat:     Mouth: Mucous membranes are moist.     Pharynx: Oropharynx is clear. No oropharyngeal exudate or posterior oropharyngeal erythema.  Eyes:     General: No scleral icterus.       Right eye: No discharge.        Left eye: No discharge.     Extraocular Movements: Extraocular movements intact.     Conjunctiva/sclera: Conjunctivae normal.     Pupils: Pupils are equal, round, and reactive to light.  Neck:     Vascular: No carotid bruit.     Comments: Swelling and induration of the left neck/face and some edema of the interior neck.  Scar of the left neck is well healed. He still has erythema and swelling of the left face but less warmth and tenderness.  Cardiovascular:     Rate and Rhythm: Normal rate and regular rhythm.     Pulses: Normal pulses.     Heart sounds: Normal heart sounds. No murmur heard.    No friction rub. No gallop.  Pulmonary:     Effort: Pulmonary effort is normal. No respiratory distress.     Breath sounds:  Normal breath sounds. No stridor. No wheezing, rhonchi or rales.  Chest:     Chest wall: No tenderness.  Abdominal:     General: Bowel sounds are normal. There is no distension.     Palpations: Abdomen is soft. There is no hepatomegaly, splenomegaly or mass.     Tenderness: There is no abdominal tenderness. There is no right CVA tenderness, left CVA tenderness, guarding or rebound.     Hernia: No hernia is present.  Musculoskeletal:        General: No swelling, tenderness, deformity or signs of injury. Normal range of motion.     Cervical back: Normal range of motion and neck supple. No rigidity or tenderness.     Right lower leg: Edema (trace) present.     Left lower leg: Edema (trace) present.     Comments:    Lymphadenopathy:     Cervical: No cervical adenopathy.     Upper Body:     Right upper body: No supraclavicular or axillary adenopathy.     Left upper body: No supraclavicular or axillary adenopathy.     Lower Body: No right inguinal adenopathy. No left inguinal adenopathy.  Skin:    General: Skin is warm and dry.     Coloration: Skin is not jaundiced or pale.     Findings: No bruising, erythema, lesion or rash.  Neurological:     General: No focal deficit present.     Mental Status: He is alert and oriented to person, place, and time. Mental status is at baseline.     Cranial Nerves: No cranial nerve deficit.     Sensory: No sensory deficit.     Motor: No weakness.     Coordination: Coordination normal.     Gait: Gait normal.     Deep Tendon Reflexes: Reflexes normal.  Psychiatric:        Mood and Affect: Mood normal.        Behavior: Behavior normal.        Thought Content: Thought content normal.        Judgment: Judgment normal.     LABS:      Latest Ref Rng & Units 09/15/2022    9:58 AM 06/23/2022  12:00 AM 05/12/2022    9:43 AM  CBC  WBC 4.0 - 10.5 K/uL 2.5  3.4     3.5   Hemoglobin 13.0 - 17.0 g/dL 16.1  09.6     04.5   Hematocrit 39.0 - 52.0 % 38.9  40      37.5   Platelets 150 - 400 K/uL 176  215     182      This result is from an external source.      Latest Ref Rng & Units 09/15/2022    9:58 AM 06/23/2022   12:00 AM 05/12/2022    9:43 AM  CMP  Glucose 70 - 99 mg/dL 83   81   BUN 6 - 20 mg/dL 11  14     14    Creatinine 0.61 - 1.24 mg/dL 4.09  0.6     8.11   Sodium 135 - 145 mmol/L 138  136     139   Potassium 3.5 - 5.1 mmol/L 3.5  3.8     3.9   Chloride 98 - 111 mmol/L 104  103     103   CO2 22 - 32 mmol/L 25  26     26    Calcium 8.9 - 10.3 mg/dL 8.9  9.5     8.7   Total Protein 6.5 - 8.1 g/dL 7.4   6.5   Total Bilirubin 0.3 - 1.2 mg/dL 0.6   0.3   Alkaline Phos 38 - 126 U/L 50  78     50   AST 15 - 41 U/L 25  33     26   ALT 0 - 44 U/L 25  25     20       This result is from an external source.     No results found for: "CEA1", "CEA" / No results found for: "CEA1", "CEA" No results found for: "PSA1" No results found for: "BJY782" No results found for: "CAN125"  No results found for: "TOTALPROTELP", "ALBUMINELP", "A1GS", "A2GS", "BETS", "BETA2SER", "GAMS", "MSPIKE", "SPEI" Lab Results  Component Value Date   TIBC 354 05/18/2022   IRONPCTSAT 31 05/18/2022   No results found for: "LDH"  STUDIES:     EXAM: 03/02/2022 MRI OF THE NECK WITH CONTRAST IMPRESSION: 1. Postsurgical changes of left hemiglossectomy, tonsillectomy, and likely left neck dissection. There is ill defined T2 hyperintense signal in the region of the left submandibular gland at the area of abnormality seen on recent prior CT neck, but without a discrete soft tissue mass. There is also asymmetric T2 hyperintense signal abnormality and contrast enhancement in the left parapharyngeal space, surrounding the medial pterygoid and masseter muscle bodies, and along the posterior margin of the mylohyoid on the left. These findings are favored to represent evolving post-surgical changes, but close follow up is recommended to ensure continued resolution  of signal abnormality and soft tissue mass seen on recent CT. 2. There is asymmetric contrast enhancement along the left aspect of the tongue. Findings are favored to represent post treatment change, with residual tumor not entirely excluded. Correlate with direct visualization. 3. Mild asymmetric T2 hyperintense signal and contrast enhancement within the paraspinal musculature on the left, nonspecific, possibly post-traumatic. 4. Chronic infarcts in the central pons, bilateral basal ganglia, and left parietal lobe. 5. Air-fluid level in the right sphenoid sinus. Correlate for symptoms of acute sinusitis.    HISTORY:   Past Medical History:  Diagnosis Date   Arthritis    Back  pain    GERD (gastroesophageal reflux disease)    History of kidney stones    Hypertension    Sleep apnea    does not have cpap yet    Past Surgical History:  Procedure Laterality Date   APPENDECTOMY     HAND SURGERY     LUMBAR LAMINECTOMY/DECOMPRESSION MICRODISCECTOMY N/A 12/28/2017   Procedure: Laminectomy and Foraminotomy Lumbar two-three-Lumbar three-four - Lumbar four-five;  Surgeon: Donalee Citrin, MD;  Location: [facility] OR;  Service: Neurosurgery;  Laterality: N/A;   OTHER SURGICAL HISTORY     removal on cancer from neck    Family History  Problem Relation Age of Onset   Thyroid disease Mother    Heart disease Father    Prostate cancer Brother    Healthy Daughter    Healthy Son     Social History:  reports that he has been smoking cigarettes. He has a 22.00 pack-year smoking history. He has never used smokeless tobacco. He reports that he does not currently use alcohol. He reports that he does not use drugs.The patient is accompanied by his wife today.  Allergies: No Known Allergies  Current Medications: Current Outpatient Medications  Medication Sig Dispense Refill   amitriptyline (ELAVIL) 75 MG tablet Take 1 tablet by mouth daily.     amLODipine (NORVASC) 10 MG tablet Take 1 tablet by mouth  every morning.     aspirin EC 81 MG tablet Take by mouth.     atorvastatin (LIPITOR) 80 MG tablet Take 80 mg by mouth daily.     chlorhexidine (PERIDEX) 0.12 % solution SMARTSIG:1 Capful(s) By Mouth Twice Daily     cyanocobalamin (VITAMIN B12) 1000 MCG/ML injection Inject 1 mL every month by subcutaneous route for 1 day.     dexamethasone 0.5 MG/5ML elixir Take by mouth.     fenofibrate 54 MG tablet TAKE ONE TABLET BY MOUTH EVERY DAY FOR 30 DAYS     folic acid (FOLVITE) 1 MG tablet Take 1 tablet (1 mg total) by mouth daily. 30 tablet 5   gabapentin (NEURONTIN) 800 MG tablet Take 800 mg by mouth 4 (four) times daily.     HYDROcodone-acetaminophen (NORCO) 10-325 MG tablet Take 1 tablet by mouth as directed.     ketorolac (TORADOL) 10 MG tablet Take 10 mg by mouth every 6 (six) hours as needed.     lidocaine (XYLOCAINE) 2 % solution SMARTSIG:15 Milliliter(s) By Mouth Every 3 Hours PRN     magic mouthwash (nystatin, lidocaine, diphenhydrAMINE, alum & mag hydroxide) suspension Swish and swallow 15 mLs 4 (four) times daily as needed for mouth pain.     magic mouthwash SOLN Take 15 mLs by mouth 4 (four) times daily as needed for mouth pain. 240 mL 5   meloxicam (MOBIC) 15 MG tablet Take 1 tablet (15 mg total) by mouth daily. 30 tablet 5   nitroGLYCERIN (NITROSTAT) 0.4 MG SL tablet PLACE 1 TABLET UNDER THE TONGUE EVERY 5 MIN FOR CHEST PAIN AS NEEDED. NO MORE THAN 3 TABS IN 15 MIN.     pantoprazole (PROTONIX) 40 MG tablet Take 1 tablet by mouth daily.     senna-docusate (SENOKOT-S) 8.6-50 MG tablet Take by mouth.     tiZANidine (ZANAFLEX) 4 MG tablet Take by mouth.     triamterene-hydrochlorothiazide (MAXZIDE-25) 37.5-25 MG tablet Take 1 tablet by mouth daily.     No current facility-administered medications for this visit.       I,Jasmine M Lassiter,acting as a Neurosurgeon for [employer]  Gilman Buttner, MD.,have documented all relevant documentation on the behalf of Dellia Beckwith, MD,as directed by   Dellia Beckwith, MD while in the presence of Dellia Beckwith, MD.

## 2022-09-15 ENCOUNTER — Other Ambulatory Visit: Payer: Self-pay | Admitting: Oncology

## 2022-09-15 ENCOUNTER — Encounter: Payer: Self-pay | Admitting: Oncology

## 2022-09-15 ENCOUNTER — Inpatient Hospital Stay: Payer: Medicare Other

## 2022-09-15 ENCOUNTER — Inpatient Hospital Stay: Payer: Medicare Other | Attending: Hematology and Oncology | Admitting: Oncology

## 2022-09-15 VITALS — BP 141/80 | HR 87 | Temp 98.6°F | Resp 18 | Ht 69.0 in | Wt 247.5 lb

## 2022-09-15 DIAGNOSIS — C760 Malignant neoplasm of head, face and neck: Secondary | ICD-10-CM

## 2022-09-15 DIAGNOSIS — C109 Malignant neoplasm of oropharynx, unspecified: Secondary | ICD-10-CM | POA: Diagnosis present

## 2022-09-15 DIAGNOSIS — F17210 Nicotine dependence, cigarettes, uncomplicated: Secondary | ICD-10-CM | POA: Insufficient documentation

## 2022-09-15 DIAGNOSIS — Z9221 Personal history of antineoplastic chemotherapy: Secondary | ICD-10-CM | POA: Insufficient documentation

## 2022-09-15 DIAGNOSIS — Z923 Personal history of irradiation: Secondary | ICD-10-CM | POA: Insufficient documentation

## 2022-09-15 LAB — CBC WITH DIFFERENTIAL (CANCER CENTER ONLY)
Abs Immature Granulocytes: 0 10*3/uL (ref 0.00–0.07)
Basophils Absolute: 0 10*3/uL (ref 0.0–0.1)
Basophils Relative: 0 %
Eosinophils Absolute: 0 10*3/uL (ref 0.0–0.5)
Eosinophils Relative: 2 %
HCT: 38.9 % — ABNORMAL LOW (ref 39.0–52.0)
Hemoglobin: 13.6 g/dL (ref 13.0–17.0)
Immature Granulocytes: 0 %
Lymphocytes Relative: 41 %
Lymphs Abs: 1 10*3/uL (ref 0.7–4.0)
MCH: 33.9 pg (ref 26.0–34.0)
MCHC: 35 g/dL (ref 30.0–36.0)
MCV: 97 fL (ref 80.0–100.0)
Monocytes Absolute: 0.4 10*3/uL (ref 0.1–1.0)
Monocytes Relative: 16 %
Neutro Abs: 1 10*3/uL — ABNORMAL LOW (ref 1.7–7.7)
Neutrophils Relative %: 41 %
Platelet Count: 176 10*3/uL (ref 150–400)
RBC: 4.01 MIL/uL — ABNORMAL LOW (ref 4.22–5.81)
RDW: 13.9 % (ref 11.5–15.5)
WBC Count: 2.5 10*3/uL — ABNORMAL LOW (ref 4.0–10.5)
nRBC: 0 % (ref 0.0–0.2)

## 2022-09-15 LAB — COMPREHENSIVE METABOLIC PANEL
ALT: 25 U/L (ref 0–44)
AST: 25 U/L (ref 15–41)
Albumin: 3.9 g/dL (ref 3.5–5.0)
Alkaline Phosphatase: 50 U/L (ref 38–126)
Anion gap: 9 (ref 5–15)
BUN: 11 mg/dL (ref 6–20)
CO2: 25 mmol/L (ref 22–32)
Calcium: 8.9 mg/dL (ref 8.9–10.3)
Chloride: 104 mmol/L (ref 98–111)
Creatinine, Ser: 0.53 mg/dL — ABNORMAL LOW (ref 0.61–1.24)
GFR, Estimated: >60 mL/min (ref >60)
Glucose, Bld: 83 mg/dL (ref 70–99)
Potassium: 3.5 mmol/L (ref 3.5–5.1)
Sodium: 138 mmol/L (ref 135–145)
Total Bilirubin: 0.6 mg/dL (ref 0.3–1.2)
Total Protein: 7.4 g/dL (ref 6.5–8.1)

## 2022-09-20 ENCOUNTER — Telehealth: Payer: Self-pay

## 2022-09-20 NOTE — Telephone Encounter (Signed)
Called patient and notified him of lab results. 

## 2022-09-20 NOTE — Telephone Encounter (Signed)
-----   Message from Dellia Beckwith, MD sent at 09/17/2022  6:56 PM EDT ----- Regarding: call Tell him labs look good but white cells still low, after effect of the treatments, so his body hasn't recovered yet, will take more time, his immune system still low

## 2022-09-24 ENCOUNTER — Encounter: Payer: Self-pay | Admitting: Oncology

## 2022-11-03 ENCOUNTER — Encounter: Payer: Self-pay | Admitting: Oncology

## 2022-11-03 NOTE — Telephone Encounter (Signed)
Created in error

## 2022-11-16 ENCOUNTER — Encounter: Payer: Self-pay | Admitting: Oncology

## 2022-11-26 ENCOUNTER — Encounter: Payer: Self-pay | Admitting: Oncology

## 2022-11-26 ENCOUNTER — Telehealth: Payer: Self-pay | Admitting: Oncology

## 2022-11-26 NOTE — Telephone Encounter (Signed)
11/26/22 Spoke with patient and confirmed next appts

## 2022-12-09 ENCOUNTER — Encounter: Payer: Self-pay | Admitting: Oncology

## 2022-12-14 LAB — COMPREHENSIVE METABOLIC PANEL
Albumin: 4.1 (ref 3.5–5.0)
Calcium: 9.9 (ref 8.7–10.7)

## 2022-12-14 LAB — BASIC METABOLIC PANEL WITH GFR
BUN: 19 (ref 4–21)
CO2: 25 — AB (ref 13–22)
Chloride: 105 (ref 99–108)
Creatinine: 0.7 (ref 0.6–1.3)
EGFR: 60
Glucose: 112
Potassium: 4 meq/L (ref 3.5–5.1)
Sodium: 138 (ref 137–147)

## 2022-12-14 LAB — CBC AND DIFFERENTIAL
HCT: 39 — AB (ref 41–53)
Hemoglobin: 13.9 (ref 13.5–17.5)
Neutrophils Absolute: 1.73
Platelets: 159 10*3/uL (ref 150–400)
WBC: 3.4

## 2022-12-14 LAB — HEPATIC FUNCTION PANEL
ALT: 30 U/L (ref 10–40)
AST: 40 (ref 14–40)
Alkaline Phosphatase: 93 (ref 25–125)
Bilirubin, Total: 0.4

## 2022-12-14 LAB — CBC: RBC: 3.96 (ref 3.87–5.11)

## 2022-12-16 ENCOUNTER — Encounter: Payer: Self-pay | Admitting: Oncology

## 2022-12-16 ENCOUNTER — Inpatient Hospital Stay: Payer: Medicare HMO | Attending: Hematology and Oncology | Admitting: Oncology

## 2022-12-16 VITALS — BP 155/85 | HR 90 | Temp 98.8°F | Resp 18 | Ht 69.0 in | Wt 258.9 lb

## 2022-12-16 DIAGNOSIS — C109 Malignant neoplasm of oropharynx, unspecified: Secondary | ICD-10-CM

## 2022-12-16 NOTE — Progress Notes (Signed)
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 This is p16 negative. He has several concerning features on his pathology, including 1 positive node with extranodal extension and focal lymphovascular invasion. He was healing well, and then developed left facial pain and swelling, this occurred prior to initiating any chemoradiation. CT scan was nonspecific. MRI scan did not show any mass affect and was consistent with inflammation of the area. He has now completed his full course of radiation along with 7 cycles of chemotherapy with carboplatin and taxol as of  December, 2023. CT neck done on 12/14/2022 that revealed stable post-treatment appearance of the neck since 06/23/2022, with no new or suspicious finding and no pathologic lymphadenopathy.  Painful left facial swelling  We can see soft tissue swelling on the CT scan and originally thought that this represented parotiditis. We had him initially on 1 antibiotic then changed to Clindamycin but neither helped. We asked his ENT surgeon in [name] for an opinion and he did not seem to know what this was either. He recommended we proceed with treatment and so we have. This does not seem to be spread of his throat cancer but did not respond to antibiotics and is very erythematous and extremely tender, and fluctuates up and down. I also tried a course of steroids which had very little benefit. I recommended a referral to a tertiary care [facility] and he was seen by Dr. Natale [facility] at [facility].  He feels this represents postoperative changes that are just more pronounced than usual. He is already on multiple medications for pain control including Gabapentin at high doses,  Tizanidine, Amitriptyline, Ibuprofen, ketorolac, and PRN hydrocodone. The latest scan still looks consistent with post treatment changes.   Chronic Back Pain Spinal stenosis with 4 back surgeries including vertebral fusion. He has chronic pain and neuropathy, and is followed by [facility].   CVA, 2023 He still has some residual right upper extremity paresthesias. He had carotid angioplasty on the left and we do see some chronic infarctions in the pons and basal ganglia.  He had a CT head done on 90/01/2023 that revealed no acute intracranial pathology or evidence of intracranial metastatic disease but multiple remote infarctions, the largest in the left parietal lobe in the MCA distribution.    Tobacco Abuse    Plan: He had a CT neck done on 12/14/2022 that revealed stable post-treatment appearance of the neck since 06/23/2022, with no new or suspicious finding and no pathologic lymphadenopathy. He also had a CT head done on the same day that revealed no acute intracranial pathology or evidence of intracranial metastatic disease but multiple remote infarctions, the largest in the left parietal lobe in the MCA distribution. He and his wife inquired about having some of his teeth pulled on the left side to see if that can help reduce swelling, and I agree with this. They cannot see a orthodontist until January, 2025. His WBC is 3.4, hemoglobin is 13.9, and platelet count is 159,000. His CMP is normal with an elevated of 1112. He had a colonoscopy done by Dr. Vinson [facility] that was clear. I will see him back in 3 months with CBC and CMP and repeat scans in 6 months. The patient was provided an opportunity  to ask questions and all were answered.  The patient and his wife agreed with the plan and demonstrated an understanding of the instructions.  The patient was advised to call back if the symptoms worsen or if the condition fails to improve as anticipated.   I provided 24 minutes of face-to-face time during this  encounter and > 50% was spent counseling as documented under my assessment and plan.    Ian Beckwith, MD  [facility] [address] [place] [address] Dept: [phone] Dept Fax: [phone]   Orders Placed This Encounter  Procedures   CBC with Differential (Cancer Case Only)    Standing Status:   Future    Standing Expiration Date:   12/16/2023   CMP (Cancer Case only)    Standing Status:   Future    Standing Expiration Date:   12/16/2023    CHIEF COMPLAINT:  CC: Stage IVB oropharynx cancer  Current Treatment: Surveillance  HISTORY OF PRESENT ILLNESS:   Oncology History  Oropharyngeal carcinoma (HCC)  11/06/2021 Initial Diagnosis   Oropharyngeal carcinoma (HCC)   11/06/2021 Cancer Staging   Staging form: Pharynx - P16 Negative Oropharynx, AJCC 8th Edition - Clinical stage from 11/06/2021: Stage IVB (cT1, cN3, cM0, p16-) - Signed by Ian Beckwith, MD on 12/28/2021 Histopathologic type: Squamous cell carcinoma, NOS Stage prefix: Initial diagnosis Histologic grade (G): G2 Histologic grading Case: 4 grade Case Tumor size (mm): 13 Lymph-vascular invasion (LVI): LVI present/identified, NOS Diagnostic confirmation: Positive histology Specimen type: Excision Staged by: Managing physician Presence of extranodal extension: Present Extent of extranodal extension: Microscopic ENE(+) Distance of extension from the native lymph node capsule to the farthest point of invasion in the extranodal tissue (mm): 6.5 ECOG performance status: Grade 1 Perineural invasion (PNI): Absent Prognostic indicators: 1.3 cm with 1/23 nodes positive -2.3 cm with ENE and focal LVI Stage used in treatment planning: Yes National guidelines used in treatment planning: Yes Type of national guideline used in treatment planning: NCCN   01/06/2022 - 03/16/2022 Chemotherapy   Patient is on Treatment Plan : HEAD/NECK  Carboplatin + Paclitaxel + XRT q7d     01/19/2022 Imaging   CT neck:  IMPRESSION:  1. 26 x 11 x 11 mm soft tissue mass posterior to the left  submandibular gland and anterior to the SCM. Some residual soft  tissue is noted in the PET scan. While this may represent  postoperative scar tissue, it is concerning for residual or  recurrent tumor.  2. Asymmetric soft tissue at the left glossal tonsillar sulcus with  focal surgical clips. No definite mass lesion is present.  3. Left carotid stent is in place. Vessel appears patent.  4. Atherosclerotic calcifications at the right carotid bifurcation  with what appears to be a high-grade proximal right ICA stenosis.  5. Right IJ Port-A-Cath in place.        INTERVAL HISTORY:  [facility] is here today for repeat clinical assessment for Stage IVB oropharyngeal cancer, treated with surgery, chemotherapy and radiation. Patient states that he feels good but complains of back pain rating a 5/10. He will return to [facility] on 09/19 for further evaluation of his increasing back pain. He had a CT neck done on 12/14/2022 that revealed stable post-treatment appearance of the neck since 06/23/2022, with no new or suspicious finding and no pathologic lymphadenopathy. He also had a CT head done on the same day that revealed no acute intracranial pathology or  evidence of intracranial metastatic disease but multiple remote infarctions, the largest in the left parietal lobe in the MCA distribution. He and his wife inquired about having some of his teeth pulled on the left side to see if that can help reduce swelling, and I agree. They cannot see a orthodontist until January, 2025. His WBC is 3.4, hemoglobin is 13.9, and platelet count is 159,000. His CMP is normal with an elevated of 1112. He had a colonoscopy done by Dr. Vinson [facility] that was clear. I will see him back in 3 months with CBC and CMP and repeat scans in 6 months. He denies signs of infection such as sore throat,  sinus drainage, cough, or urinary symptoms.  He denies fevers or recurrent chills. He denies pain. He denies nausea, vomiting, chest pain, dyspnea or cough. His appetite is good and his weight has increased 11 pounds over last 3 months . He is accompanied with his wife at today's visit.   REVIEW OF SYSTEMS:  Review of Systems  Constitutional: Negative.  Negative for appetite change, chills, diaphoresis, fatigue, fever and unexpected weight change.  HENT:   Positive for hearing loss and trouble swallowing. Negative for lump/mass, mouth sores, nosebleeds, sore throat, tinnitus and voice change.        + left ear pain intermittently + left jaw pain + left facial swelling Nasty taste in his mouth  Eyes: Negative.  Negative for eye problems and icterus.  Respiratory: Negative.  Negative for chest tightness, cough, hemoptysis, shortness of breath and wheezing.   Cardiovascular: Negative.  Negative for chest pain, leg swelling and palpitations.  Gastrointestinal: Negative.  Negative for abdominal distention, abdominal pain, blood in stool, constipation, diarrhea, nausea, rectal pain and vomiting.  Endocrine: Negative.  Negative for hot flashes.  Genitourinary: Negative.  Negative for bladder incontinence, difficulty urinating, dyspareunia, dysuria, frequency, hematuria, nocturia, pelvic pain and penile discharge.   Musculoskeletal:  Positive for arthralgias, back pain (5/10) and myalgias. Negative for flank pain, gait problem, neck pain and neck stiffness.  Skin: Negative.  Negative for itching, rash and wound.  Neurological: Negative.  Negative for dizziness, extremity weakness, gait problem, headaches, light-headedness, numbness, seizures and speech difficulty.  Hematological: Negative.  Negative for adenopathy. Does not bruise/bleed easily.  Psychiatric/Behavioral: Negative.  Negative for confusion, decreased concentration, depression, sleep disturbance and suicidal ideas. The patient is not  nervous/anxious.      VITALS:  Blood pressure (!) 155/85, pulse 90, temperature 98.8 F (37.1 C), temperature source Oral, resp. rate 18, height 5\' 9"  (1.753 m), weight 258 lb 14.4 oz (117.4 kg), SpO2 98%.  Wt Readings from Last 3 Encounters:  12/16/22 258 lb 14.4 oz (117.4 kg)  09/15/22 247 lb 8 oz (112.3 kg)  06/24/22 255 lb 11.2 oz (116 kg)    Body mass index is 38.23 kg/m.  Performance status (ECOG): 1 - Symptomatic but completely ambulatory  PHYSICAL EXAM:  Physical Exam Vitals and nursing note reviewed. Exam conducted with a chaperone present.  Constitutional:      General: He is not in acute distress.    Appearance: Normal appearance. He is normal weight. He is not ill-appearing, toxic-appearing or diaphoretic.  HENT:     Head: Normocephalic and atraumatic.     Comments: Swelling in the left face and jaw with some erythema, much less tender. He has induration of the left neck.  Mild edema of the neck.     Right Ear: Tympanic membrane, ear canal and external ear normal. There is  no impacted cerumen.     Left Ear: Tympanic membrane, ear canal and external ear normal. There is no impacted cerumen.     Nose: Nose normal. No congestion or rhinorrhea.     Mouth/Throat:     Mouth: Mucous membranes are moist.     Pharynx: Oropharynx is clear. No oropharyngeal exudate or posterior oropharyngeal erythema.  Eyes:     General: No scleral icterus.       Right eye: No discharge.        Left eye: No discharge.     Extraocular Movements: Extraocular movements intact.     Conjunctiva/sclera: Conjunctivae normal.     Pupils: Pupils are equal, round, and reactive to light.  Neck:     Vascular: No carotid bruit.  Cardiovascular:     Rate and Rhythm: Normal rate and regular rhythm.     Pulses: Normal pulses.     Heart sounds: Normal heart sounds. No murmur heard.    No friction rub. No gallop.  Pulmonary:     Effort: Pulmonary effort is normal. No respiratory distress.     Breath  sounds: Normal breath sounds. No stridor. No wheezing, rhonchi or rales.  Chest:     Chest wall: No tenderness.  Abdominal:     General: Bowel sounds are normal. There is no distension.     Palpations: Abdomen is soft. There is no hepatomegaly, splenomegaly or mass.     Tenderness: There is no abdominal tenderness. There is no right CVA tenderness, left CVA tenderness, guarding or rebound.     Hernia: No hernia is present.  Musculoskeletal:        General: No swelling, tenderness, deformity or signs of injury. Normal range of motion.     Cervical back: Normal range of motion and neck supple. No rigidity or tenderness.     Right lower leg: No edema (trace).     Left lower leg: No edema (trace).     Comments:    Lymphadenopathy:     Cervical: No cervical adenopathy.     Upper Body:     Right upper body: No supraclavicular or axillary adenopathy.     Left upper body: No supraclavicular or axillary adenopathy.     Lower Body: No right inguinal adenopathy. No left inguinal adenopathy.  Skin:    General: Skin is warm and dry.     Coloration: Skin is not jaundiced or pale.     Findings: No bruising, erythema, lesion or rash.  Neurological:     General: No focal deficit present.     Mental Status: He is alert and oriented to person, place, and time. Mental status is at baseline.     Cranial Nerves: No cranial nerve deficit.     Sensory: No sensory deficit.     Motor: No weakness.     Coordination: Coordination normal.     Gait: Gait normal.     Deep Tendon Reflexes: Reflexes normal.  Psychiatric:        Mood and Affect: Mood normal.        Behavior: Behavior normal.        Thought Content: Thought content normal.        Judgment: Judgment normal.     LABS:      Latest Ref Rng & Units 12/14/2022   12:16 PM 09/15/2022    9:58 AM 06/23/2022   12:00 AM  CBC  WBC  3.4     2.5  3.4  Hemoglobin 13.5 - 17.5 13.9     13.6  13.6      Hematocrit 41 - 53 39     38.9  40       Platelets 150 - 400 K/uL 159     176  215         This result is from an external source.      Latest Ref Rng & Units 12/14/2022   12:16 PM 09/15/2022    9:58 AM 06/23/2022   12:00 AM  CMP  Glucose 70 - 99 mg/dL  83    BUN 4 - 21 19     11  14       Creatinine 0.6 - 1.3 0.7     0.53  0.6      Sodium 137 - 147 138     138  136      Potassium 3.5 - 5.1 mEq/L 4.0     3.5  3.8      Chloride 99 - 108 105     104  103      CO2 13 - 22 25     25  26       Calcium 8.7 - 10.7 9.9     8.9  9.5      Total Protein 6.5 - 8.1 g/dL  7.4    Total Bilirubin 0.3 - 1.2 mg/dL  0.6    Alkaline Phos 25 - 125 93     50  78      AST 14 - 40 40     25  33      ALT 10 - 40 U/L 30     25  25          This result is from an external source.     No results found for: "CEA1", "CEA" / No results found for: "CEA1", "CEA" No results found for: "PSA1" No results found for: "ZOX096" No results found for: "CAN125"  No results found for: "TOTALPROTELP", "ALBUMINELP", "A1GS", "A2GS", "BETS", "BETA2SER", "GAMS", "MSPIKE", "SPEI" Lab Results  Component Value Date   TIBC 354 05/18/2022   IRONPCTSAT 31 05/18/2022   No results found for: "LDH"  STUDIES:  Exam: 12/14/2022 CT Neck with Contrast Impression: Stable post-treatment appearance of the neck since 06/23/2022, with no new or suspicious finding. No pathologic lymphadenopathy.   Exam: 12/14/2022 CT Head without and with Contrast Impression: No acute intracranial pathology or evidence of intracranial metastatic disease Multiple remote infarct, the largest in the left parietal lobe in the MCA distribution.        HISTORY:   Past Medical History:  Diagnosis Date   Arthritis    Back pain    GERD (gastroesophageal reflux disease)    History of kidney stones    Hypertension    Sleep apnea    does not have cpap yet    Past Surgical History:  Procedure Laterality Date   APPENDECTOMY     HAND SURGERY     LUMBAR LAMINECTOMY/DECOMPRESSION  MICRODISCECTOMY N/A 12/28/2017   Procedure: Laminectomy and Foraminotomy Lumbar two-three-Lumbar three-four - Lumbar four-five;  Surgeon: Donalee Citrin, MD;  Location: [facility] OR;  Service: Neurosurgery;  Laterality: N/A;   OTHER SURGICAL HISTORY     removal on cancer from neck    Family History  Problem Relation Age of Onset   Thyroid disease Mother    Heart disease Father    Prostate cancer Brother    Healthy Daughter    Healthy  Son     Social History:  reports that he has been smoking cigarettes. He has a 22 pack-year smoking history. He has never used smokeless tobacco. He reports that he does not currently use alcohol. He reports that he does not use drugs.The patient is accompanied by his wife today.  Allergies: No Known Allergies  Current Medications: Current Outpatient Medications  Medication Sig Dispense Refill   amitriptyline (ELAVIL) 75 MG tablet Take 1 tablet by mouth daily.     amLODipine (NORVASC) 10 MG tablet Take 1 tablet by mouth every morning.     aspirin EC 81 MG tablet Take by mouth.     atorvastatin (LIPITOR) 80 MG tablet Take 80 mg by mouth daily.     chlorhexidine (PERIDEX) 0.12 % solution SMARTSIG:1 Capful(s) By Mouth Twice Daily     cyanocobalamin (VITAMIN B12) 1000 MCG/ML injection Inject 1 mL every month by subcutaneous route for 1 day.     fenofibrate 54 MG tablet TAKE ONE TABLET BY MOUTH EVERY DAY FOR 30 DAYS     folic acid (FOLVITE) 1 MG tablet Take 1 tablet (1 mg total) by mouth daily. 30 tablet 5   gabapentin (NEURONTIN) 800 MG tablet Take 800 mg by mouth 4 (four) times daily.     HYDROcodone-acetaminophen (NORCO) 10-325 MG tablet Take 1 tablet by mouth as directed.     ketorolac (TORADOL) 10 MG tablet Take 10 mg by mouth every 6 (six) hours as needed.     lidocaine (XYLOCAINE) 2 % solution SMARTSIG:15 Milliliter(s) By Mouth Every 3 Hours PRN     magic mouthwash (nystatin, lidocaine, diphenhydrAMINE, alum & mag hydroxide) suspension Swish and swallow 15 mLs 4  (four) times daily as needed for mouth pain.     magic mouthwash SOLN Take 15 mLs by mouth 4 (four) times daily as needed for mouth pain. 240 mL 5   meloxicam (MOBIC) 15 MG tablet Take 1 tablet (15 mg total) by mouth daily. 30 tablet 5   nitroGLYCERIN (NITROSTAT) 0.4 MG SL tablet PLACE 1 TABLET UNDER THE TONGUE EVERY 5 MIN FOR CHEST PAIN AS NEEDED. NO MORE THAN 3 TABS IN 15 MIN.     pantoprazole (PROTONIX) 40 MG tablet Take 1 tablet by mouth daily.     senna-docusate (SENOKOT-S) 8.6-50 MG tablet Take by mouth.     tiZANidine (ZANAFLEX) 4 MG tablet Take by mouth.     triamterene-hydrochlorothiazide (MAXZIDE-25) 37.5-25 MG tablet Take 1 tablet by mouth daily.     No current facility-administered medications for this visit.     I,Jasmine M Lassiter,acting as a scribe for Ian Beckwith, MD.,have documented all relevant documentation on the behalf of Ian Beckwith, MD,as directed by  Ian Beckwith, MD while in the presence of Ian Beckwith, MD.

## 2022-12-17 ENCOUNTER — Telehealth: Payer: Self-pay | Admitting: Oncology

## 2022-12-17 NOTE — Telephone Encounter (Signed)
Patient has been scheduled. Aware of appt date and time.   Scheduling Message Entered by Gery Pray H on 12/16/2022 at  2:10 PM Priority: Routine <No visit type provided>  Department: [facility]  Provider:  Scheduling Notes:  RT 3 months with labs - okay to see [facility]

## 2023-01-07 ENCOUNTER — Encounter: Payer: Self-pay | Admitting: Oncology

## 2023-01-19 ENCOUNTER — Encounter: Payer: Self-pay | Admitting: Oncology

## 2023-01-24 ENCOUNTER — Encounter: Payer: Self-pay | Admitting: Oncology

## 2023-03-18 NOTE — Progress Notes (Unsigned)
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 He was treated with radical surgical resection. P16 was negative negative. He has several concerning features on his pathology, including 1 positive node with extranodal extension and focal lymphovascular invasion. He was healing well, and then developed left facial pain and swelling, this occurred prior to initiating any chemoradiation. CT scan was nonspecific. MRI scan did not show any mass affect and was consistent with inflammation of the area. He completed the full course of radiation along with 7 cycles of chemotherapy with carboplatin and paclitaxel in  December 2023. He has had persistent left facial pain and swelling. This is felt to be postsurgical, just more pronounce than usually seen. He continues gabapentin, meloxicam and hydrocodone/APAP as needed, but is no longer taking tizanidine. Most recent CT imaging in September revealed stable post-treatment appearance with no new or suspicious finding or pathologic lymphadenopathy.  He remains without evidence of disease. We will plan to see him back in later February with a CBC, CMP and CT neck to reassess his diease status prior to his back surgery.   The patient understands the plans discussed today and is in agreement with them.  He knows to contact our office if he develops concerns prior to his next appointment.   I provided 20 minutes of face-to-face time during this encounter and > 50% was spent counseling as documented under my assessment and plan.    Adah Perl, PA-C  [facility] [place] [facility] [address] 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  Current Treatment:  Surveillance  HISTORY OF PRESENT ILLNESS:   Oncology History  Oropharyngeal carcinoma (HCC)  11/06/2021 Initial Diagnosis   Oropharyngeal carcinoma (HCC)   11/06/2021 Cancer Staging   Staging form: Pharynx - P16 Negative Oropharynx, AJCC 8th Edition - Clinical stage from 11/06/2021: Stage IVB (cT1, cN3, cM0, p16-) - Signed by Dellia Beckwith, MD on 12/28/2021 Histopathologic type: Squamous cell carcinoma, NOS Stage prefix: Initial diagnosis Histologic grade (G): G2 Histologic grading system: 4 grade system Tumor size (mm): 13 Lymph-vascular invasion (LVI): LVI present/identified, NOS Diagnostic confirmation: Positive histology Specimen type: Excision Staged by: Managing physician Presence of extranodal extension: Present Extent of extranodal extension: Microscopic ENE(+) Distance of extension from the native lymph node capsule to the farthest point of invasion in the extranodal tissue (mm): 6.5 ECOG performance status: Grade 1 Perineural invasion (PNI): Absent Prognostic indicators: 1.3 cm with 1/23 nodes positive -2.3 cm with ENE and focal LVI Stage used in treatment planning: Yes National guidelines used in treatment planning: Yes Type of  national guideline used in treatment planning: NCCN   01/06/2022 - 03/16/2022 Chemotherapy   Patient is on Treatment Plan : HEAD/NECK Carboplatin + Paclitaxel + XRT q7d     01/19/2022 Imaging   CT  neck:  IMPRESSION:  1. 26 x 11 x 11 mm soft tissue mass posterior to the left  submandibular gland and anterior to the SCM. Some residual soft  tissue is noted in the PET scan. While this may represent  postoperative scar tissue, it is concerning for residual or  recurrent tumor.  2. Asymmetric soft tissue at the left glossal tonsillar sulcus with  focal surgical clips. No definite mass lesion is present.  3. Left carotid stent is in place. Vessel appears patent.  4. Atherosclerotic calcifications at the right carotid bifurcation  with what appears to be a high-grade proximal right ICA stenosis.  5. Right IJ Port-A-Cath in place.        INTERVAL HISTORY:  Davontre is here today for repeat clinical assessment. He states he has persistent left facial swelling and pain. He still has increased pain with eating, as well as difficulty chewing solids. He denies fevers or chills. He also reports lower back  pain. His appetite is good and he is eating better. His weight has increased 16 pounds over last 3 months . He states he is schedule to have revision of hardware in the lumbar spine on March 5.  REVIEW OF SYSTEMS:  Review of Systems  Constitutional:  Negative for appetite change, chills, diaphoresis, fatigue, fever and unexpected weight change.  HENT:   Negative for mouth sores, nosebleeds, sore throat and trouble swallowing.        Persistent left facial swelling and pain, trouble chewing solids  Respiratory:  Negative for cough and shortness of breath.   Cardiovascular:  Negative for chest pain and leg swelling.  Gastrointestinal:  Negative for abdominal pain, constipation, diarrhea, nausea and vomiting.  Genitourinary:  Negative for difficulty urinating, dysuria, frequency and hematuria.   Musculoskeletal:  Positive for back pain. Negative for arthralgias, gait problem and myalgias.  Skin:  Negative for itching, rash and wound.  Neurological:  Negative for dizziness, extremity weakness,  gait problem, headaches, light-headedness, numbness and speech difficulty.  Hematological:  Negative for adenopathy.  Psychiatric/Behavioral:  Negative for depression and sleep disturbance. The patient is not nervous/anxious.      VITALS:  Blood pressure (!) 176/84, pulse 88, temperature 98.8 F (37.1 C), temperature source Oral, resp. rate 20, height 5\' 9"  (1.753 m), weight 274 lb 9.6 oz (124.6 kg), SpO2 97%.  Wt Readings from Last 3 Encounters:  03/22/23 274 lb 9.6 oz (124.6 kg)  12/16/22 258 lb 14.4 oz (117.4 kg)  09/15/22 247 lb 8 oz (112.3 kg)    Body mass index is 40.55 kg/m.  Performance status (ECOG): 1 - Symptomatic but completely ambulatory  PHYSICAL EXAM:  Physical Exam Vitals and nursing note reviewed.  Constitutional:      General: He is not in acute distress.    Appearance: Normal appearance. He is normal weight.  HENT:     Head: Normocephalic and atraumatic.     Comments: Stable left facial swelling and tenderness    Mouth/Throat:     Mouth: Mucous membranes are moist.     Pharynx: Oropharynx is clear. No oropharyngeal exudate or posterior oropharyngeal erythema.     Comments: Stable, persistent swelling left oropharyngeal wall Eyes:     General: No scleral icterus.    Extraocular Movements: Extraocular movements  intact.     Conjunctiva/sclera: Conjunctivae normal.     Pupils: Pupils are equal, round, and reactive to light.  Cardiovascular:     Rate and Rhythm: Normal rate and regular rhythm.     Heart sounds: Normal heart sounds. No murmur heard.    No friction rub. No gallop.  Pulmonary:     Effort: Pulmonary effort is normal.     Breath sounds: Normal breath sounds. No wheezing, rhonchi or rales.  Abdominal:     General: Bowel sounds are normal. There is no distension.     Palpations: Abdomen is soft. There is no mass.     Tenderness: There is no abdominal tenderness.  Musculoskeletal:        General: Normal range of motion.     Cervical back: Normal  range of motion and neck supple. No tenderness.     Right lower leg: No edema.     Left lower leg: No edema.  Lymphadenopathy:     Cervical: No cervical adenopathy.  Skin:    General: Skin is warm and dry.     Coloration: Skin is not jaundiced.     Findings: No rash.  Neurological:     Mental Status: He is alert and oriented to person, place, and time.     Cranial Nerves: No cranial nerve deficit.  Psychiatric:        Mood and Affect: Mood normal.        Behavior: Behavior normal.        Thought Content: Thought content normal.     LABS:      Latest Ref Rng & Units 03/22/2023   10:28 AM 12/14/2022   12:00 AM 09/15/2022    9:58 AM  CBC  WBC 4.0 - 10.5 K/uL 4.8  3.4     2.5   Hemoglobin 13.0 - 17.0 g/dL 44.0  10.2     72.5   Hematocrit 39.0 - 52.0 % 37.0  39     38.9   Platelets 150 - 400 K/uL 200  159     176      This result is from an external source.      Latest Ref Rng & Units 03/22/2023   10:28 AM 12/14/2022   12:16 PM 12/14/2022   12:00 AM  CMP  Glucose 70 - 99 mg/dL 366     BUN 6 - 20 mg/dL 13   19      Creatinine 0.61 - 1.24 mg/dL 4.40   0.7      Sodium 135 - 145 mmol/L 139   138      Potassium 3.5 - 5.1 mmol/L 3.6   4.0      Chloride 98 - 111 mmol/L 101   105      CO2 22 - 32 mmol/L 24   25      Calcium 8.9 - 10.3 mg/dL 9.4  9.9       Total Protein 6.5 - 8.1 g/dL 7.0     Total Bilirubin <1.2 mg/dL 0.4     Alkaline Phos 38 - 126 U/L 69  93       AST 15 - 41 U/L 35  40       ALT 0 - 44 U/L 32  30          This result is from an external source.    Lab Results  Component Value Date   TIBC 354 05/18/2022   IRONPCTSAT 31 05/18/2022  STUDIES:  No results found.    HISTORY:   Past Medical History:  Diagnosis Date   Arthritis    Back pain    GERD (gastroesophageal reflux disease)    History of kidney stones    Hypertension    Sleep apnea    does not have cpap yet    Past Surgical History:  Procedure Laterality Date   APPENDECTOMY      HAND SURGERY     LUMBAR LAMINECTOMY/DECOMPRESSION MICRODISCECTOMY N/A 12/28/2017   Procedure: Laminectomy and Foraminotomy Lumbar two-three-Lumbar three-four - Lumbar four-five;  Surgeon: Donalee Citrin, MD;  Location: [facility] OR;  Service: Neurosurgery;  Laterality: N/A;   OTHER SURGICAL HISTORY     removal on cancer from neck    Family History  Problem Relation Age of Onset   Thyroid disease Mother    Heart disease Father    Prostate cancer Brother    Healthy Daughter    Healthy Son     Social History:  reports that he quit smoking about 2 months ago. His smoking use included cigarettes. He has a 22 pack-year smoking history. He has never used smokeless tobacco. He reports that he does not currently use alcohol. He reports that he does not use drugs.The patient is alone today.  Allergies: No Known Allergies  Current Medications: Current Outpatient Medications  Medication Sig Dispense Refill   pentoxifylline (TRENTAL) 400 MG CR tablet take one tablet BY MOUTH IN THE MORNING, AND take one tablet AT NOON, AND take one tablet IN THE EVENING. take with meals.     senna-docusate (SENOKOT-S) 8.6-50 MG tablet Take by mouth.     amitriptyline (ELAVIL) 75 MG tablet Take 1 tablet by mouth daily.     amLODipine (NORVASC) 10 MG tablet Take 1 tablet by mouth every morning.     aspirin EC 81 MG tablet Take by mouth.     atorvastatin (LIPITOR) 80 MG tablet Take 80 mg by mouth daily.     chlorhexidine (PERIDEX) 0.12 % solution SMARTSIG:1 Capful(s) By Mouth Twice Daily (Patient not taking: Reported on 03/22/2023)     cyanocobalamin (VITAMIN B12) 1000 MCG/ML injection Inject 1 mL every month by subcutaneous route for 1 day.     docusate sodium (COLACE) 50 MG capsule Take by mouth.     fenofibrate 54 MG tablet TAKE ONE TABLET BY MOUTH EVERY DAY FOR 30 DAYS     folic acid (FOLVITE) 1 MG tablet Take 1 tablet (1 mg total) by mouth daily. 30 tablet 5   gabapentin (NEURONTIN) 800 MG tablet Take 800 mg by mouth 4  (four) times daily.     HYDROcodone-acetaminophen (NORCO) 10-325 MG tablet Take 1 tablet by mouth as directed.     meloxicam (MOBIC) 15 MG tablet Take 1 tablet (15 mg total) by mouth daily. 30 tablet 5   nitroGLYCERIN (NITROSTAT) 0.4 MG SL tablet PLACE 1 TABLET UNDER THE TONGUE EVERY 5 MIN FOR CHEST PAIN AS NEEDED. NO MORE THAN 3 TABS IN 15 MIN.     pantoprazole (PROTONIX) 40 MG tablet Take 1 tablet by mouth daily.     phentermine (ADIPEX-P) 37.5 MG tablet Take 37.5 mg by mouth daily. (Patient not taking: Reported on 03/22/2023)     prochlorperazine (COMPAZINE) 10 MG tablet Take 10 mg by mouth every 6 (six) hours as needed.     tiZANidine (ZANAFLEX) 4 MG tablet Take by mouth. (Patient not taking: Reported on 03/22/2023)     triamterene-hydrochlorothiazide (MAXZIDE-25) 37.5-25 MG tablet Take  1 tablet by mouth daily.     No current facility-administered medications for this visit.

## 2023-03-22 ENCOUNTER — Inpatient Hospital Stay: Payer: Medicare HMO | Attending: Hematology and Oncology | Admitting: Hematology and Oncology

## 2023-03-22 ENCOUNTER — Encounter: Payer: Self-pay | Admitting: Hematology and Oncology

## 2023-03-22 ENCOUNTER — Telehealth: Payer: Self-pay | Admitting: Hematology and Oncology

## 2023-03-22 ENCOUNTER — Inpatient Hospital Stay: Payer: Medicare HMO

## 2023-03-22 VITALS — BP 176/84 | HR 88 | Temp 98.8°F | Resp 20 | Ht 69.0 in | Wt 274.6 lb

## 2023-03-22 DIAGNOSIS — Z79899 Other long term (current) drug therapy: Secondary | ICD-10-CM | POA: Insufficient documentation

## 2023-03-22 DIAGNOSIS — C109 Malignant neoplasm of oropharynx, unspecified: Secondary | ICD-10-CM | POA: Insufficient documentation

## 2023-03-22 DIAGNOSIS — Z923 Personal history of irradiation: Secondary | ICD-10-CM | POA: Insufficient documentation

## 2023-03-22 DIAGNOSIS — Z8042 Family history of malignant neoplasm of prostate: Secondary | ICD-10-CM | POA: Diagnosis not present

## 2023-03-22 DIAGNOSIS — Z9221 Personal history of antineoplastic chemotherapy: Secondary | ICD-10-CM | POA: Diagnosis not present

## 2023-03-22 DIAGNOSIS — Z87891 Personal history of nicotine dependence: Secondary | ICD-10-CM | POA: Diagnosis not present

## 2023-03-22 LAB — CMP (CANCER CENTER ONLY)
ALT: 32 U/L (ref 0–44)
AST: 35 U/L (ref 15–41)
Albumin: 4.2 g/dL (ref 3.5–5.0)
Alkaline Phosphatase: 69 U/L (ref 38–126)
Anion gap: 13 (ref 5–15)
BUN: 13 mg/dL (ref 6–20)
CO2: 24 mmol/L (ref 22–32)
Calcium: 9.4 mg/dL (ref 8.9–10.3)
Chloride: 101 mmol/L (ref 98–111)
Creatinine: 0.86 mg/dL (ref 0.61–1.24)
GFR, Estimated: >60 mL/min (ref >60)
Glucose, Bld: 111 mg/dL — ABNORMAL HIGH (ref 70–99)
Potassium: 3.6 mmol/L (ref 3.5–5.1)
Sodium: 139 mmol/L (ref 135–145)
Total Bilirubin: 0.4 mg/dL (ref <1.2)
Total Protein: 7 g/dL (ref 6.5–8.1)

## 2023-03-22 LAB — CBC WITH DIFFERENTIAL (CANCER CENTER ONLY)
Abs Immature Granulocytes: 0.03 10*3/uL (ref 0.00–0.07)
Basophils Absolute: 0 10*3/uL (ref 0.0–0.1)
Basophils Relative: 0 %
Eosinophils Absolute: 0 10*3/uL (ref 0.0–0.5)
Eosinophils Relative: 1 %
HCT: 37 % — ABNORMAL LOW (ref 39.0–52.0)
Hemoglobin: 13.4 g/dL (ref 13.0–17.0)
Immature Granulocytes: 1 %
Lymphocytes Relative: 24 %
Lymphs Abs: 1.1 10*3/uL (ref 0.7–4.0)
MCH: 34 pg (ref 26.0–34.0)
MCHC: 36.2 g/dL — ABNORMAL HIGH (ref 30.0–36.0)
MCV: 93.9 fL (ref 80.0–100.0)
Monocytes Absolute: 0.5 10*3/uL (ref 0.1–1.0)
Monocytes Relative: 10 %
Neutro Abs: 3.1 10*3/uL (ref 1.7–7.7)
Neutrophils Relative %: 64 %
Platelet Count: 200 10*3/uL (ref 150–400)
RBC: 3.94 MIL/uL — ABNORMAL LOW (ref 4.22–5.81)
RDW: 13.5 % (ref 11.5–15.5)
WBC Count: 4.8 10*3/uL (ref 4.0–10.5)
nRBC: 0 % (ref 0.0–0.2)
nRBC: 0 /100{WBCs}

## 2023-03-22 NOTE — Telephone Encounter (Signed)
03/22/23 Next appt scheduled and confirmed with patient.

## 2023-03-22 NOTE — Assessment & Plan Note (Addendum)
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 He was treated with radical surgical resection. P16 was negative negative. He has several concerning features on his pathology, including 1 positive node with extranodal extension and focal lymphovascular invasion. He was healing well, and then developed left facial pain and swelling, this occurred prior to initiating any chemoradiation. CT scan was nonspecific. MRI scan did not show any mass affect and was consistent with inflammation of the area. He completed the full course of radiation along with 7 cycles of chemotherapy with carboplatin and paclitaxel in  December 2023. He has had persistent left facial pain and swelling. This is felt to be postsurgical, just more pronounce than usually seen. He continues gabapentin, meloxicam and hydrocodone/APAP as needed, but is no longer taking tizanidine. Most recent CT imaging in September revealed stable post-treatment appearance with no new or suspicious finding or pathologic lymphadenopathy.  He remains without evidence of disease. We will plan to see him back in later February with a CBC, CMP and CT neck to reassess his diease status prior to his back surgery.

## 2023-05-02 ENCOUNTER — Other Ambulatory Visit: Payer: Self-pay

## 2023-05-02 ENCOUNTER — Encounter: Payer: Self-pay | Admitting: Oncology

## 2023-05-02 DIAGNOSIS — C109 Malignant neoplasm of oropharynx, unspecified: Secondary | ICD-10-CM

## 2023-05-04 ENCOUNTER — Other Ambulatory Visit: Payer: Self-pay | Admitting: Oncology

## 2023-05-04 ENCOUNTER — Other Ambulatory Visit: Payer: Self-pay

## 2023-05-04 DIAGNOSIS — C109 Malignant neoplasm of oropharynx, unspecified: Secondary | ICD-10-CM

## 2023-05-04 DIAGNOSIS — D529 Folate deficiency anemia, unspecified: Secondary | ICD-10-CM

## 2023-05-04 MED ORDER — FOLIC ACID 1 MG PO TABS: 1.0000 mg | ORAL_TABLET | Freq: Every day | ORAL | 5 refills | Status: DC

## 2023-05-05 ENCOUNTER — Ambulatory Visit (HOSPITAL_BASED_OUTPATIENT_CLINIC_OR_DEPARTMENT_OTHER)
Admission: RE | Admit: 2023-05-05 | Discharge: 2023-05-05 | Disposition: A | Discharge status: Home / Self Care | Payer: Medicare HMO | Source: Ambulatory Visit | Attending: Hematology and Oncology | Admitting: Hematology and Oncology

## 2023-05-05 ENCOUNTER — Inpatient Hospital Stay: Payer: Medicare HMO

## 2023-05-05 DIAGNOSIS — C109 Malignant neoplasm of oropharynx, unspecified: Secondary | ICD-10-CM | POA: Diagnosis not present

## 2023-05-05 MED ORDER — IOHEXOL 300 MG/ML  SOLN
100.0000 mL | Freq: Once | INTRAMUSCULAR | Status: AC | PRN
  Administered 2023-05-05: 75 mL via INTRAVENOUS

## 2023-05-05 NOTE — Telephone Encounter (Signed)
05/05/23 Attempted to call patient to add lab appt and move appt up.

## 2023-05-09 ENCOUNTER — Encounter: Payer: Self-pay | Admitting: Oncology

## 2023-05-24 ENCOUNTER — Inpatient Hospital Stay: Payer: Medicare HMO

## 2023-05-24 ENCOUNTER — Other Ambulatory Visit: Payer: Self-pay | Admitting: Oncology

## 2023-05-24 ENCOUNTER — Telehealth: Payer: Self-pay | Admitting: Oncology

## 2023-05-24 ENCOUNTER — Encounter: Payer: Self-pay | Admitting: Oncology

## 2023-05-24 ENCOUNTER — Inpatient Hospital Stay: Payer: Medicare HMO | Attending: Hematology and Oncology | Admitting: Oncology

## 2023-05-24 VITALS — BP 166/96 | HR 104 | Temp 98.4°F | Resp 18 | Ht 69.0 in | Wt 272.3 lb

## 2023-05-24 DIAGNOSIS — E538 Deficiency of other specified B group vitamins: Secondary | ICD-10-CM | POA: Insufficient documentation

## 2023-05-24 DIAGNOSIS — C109 Malignant neoplasm of oropharynx, unspecified: Secondary | ICD-10-CM | POA: Insufficient documentation

## 2023-05-24 DIAGNOSIS — M549 Dorsalgia, unspecified: Secondary | ICD-10-CM | POA: Diagnosis not present

## 2023-05-24 DIAGNOSIS — G8929 Other chronic pain: Secondary | ICD-10-CM | POA: Insufficient documentation

## 2023-05-24 DIAGNOSIS — D6481 Anemia due to antineoplastic chemotherapy: Secondary | ICD-10-CM

## 2023-05-24 DIAGNOSIS — Z9221 Personal history of antineoplastic chemotherapy: Secondary | ICD-10-CM | POA: Insufficient documentation

## 2023-05-24 DIAGNOSIS — Z87891 Personal history of nicotine dependence: Secondary | ICD-10-CM | POA: Insufficient documentation

## 2023-05-24 DIAGNOSIS — Z79899 Other long term (current) drug therapy: Secondary | ICD-10-CM | POA: Insufficient documentation

## 2023-05-24 DIAGNOSIS — G629 Polyneuropathy, unspecified: Secondary | ICD-10-CM | POA: Insufficient documentation

## 2023-05-24 DIAGNOSIS — Z923 Personal history of irradiation: Secondary | ICD-10-CM | POA: Diagnosis not present

## 2023-05-24 LAB — CMP (CANCER CENTER ONLY)
ALT: 29 U/L (ref 0–44)
AST: 41 U/L (ref 15–41)
Albumin: 4.6 g/dL (ref 3.5–5.0)
Alkaline Phosphatase: 74 U/L (ref 38–126)
Anion gap: 16 — ABNORMAL HIGH (ref 5–15)
BUN: 19 mg/dL (ref 6–20)
CO2: 23 mmol/L (ref 22–32)
Calcium: 9.9 mg/dL (ref 8.9–10.3)
Chloride: 99 mmol/L (ref 98–111)
Creatinine: 0.86 mg/dL (ref 0.61–1.24)
GFR, Estimated: >60 mL/min (ref >60)
Glucose, Bld: 92 mg/dL (ref 70–99)
Potassium: 3.8 mmol/L (ref 3.5–5.1)
Sodium: 137 mmol/L (ref 135–145)
Total Bilirubin: 0.5 mg/dL (ref 0.0–1.2)
Total Protein: 7.7 g/dL (ref 6.5–8.1)

## 2023-05-24 LAB — CBC WITH DIFFERENTIAL (CANCER CENTER ONLY)
Abs Immature Granulocytes: 0.04 10*3/uL (ref 0.00–0.07)
Basophils Absolute: 0 10*3/uL (ref 0.0–0.1)
Basophils Relative: 0 %
Eosinophils Absolute: 0 10*3/uL (ref 0.0–0.5)
Eosinophils Relative: 1 %
HCT: 39.9 % (ref 39.0–52.0)
Hemoglobin: 14.5 g/dL (ref 13.0–17.0)
Immature Granulocytes: 1 %
Lymphocytes Relative: 27 %
Lymphs Abs: 1 10*3/uL (ref 0.7–4.0)
MCH: 34.5 pg — ABNORMAL HIGH (ref 26.0–34.0)
MCHC: 36.3 g/dL — ABNORMAL HIGH (ref 30.0–36.0)
MCV: 95 fL (ref 80.0–100.0)
Monocytes Absolute: 0.4 10*3/uL (ref 0.1–1.0)
Monocytes Relative: 10 %
Neutro Abs: 2.2 10*3/uL (ref 1.7–7.7)
Neutrophils Relative %: 61 %
Platelet Count: 191 10*3/uL (ref 150–400)
RBC: 4.2 MIL/uL — ABNORMAL LOW (ref 4.22–5.81)
RDW: 13.5 % (ref 11.5–15.5)
WBC Count: 3.7 10*3/uL — ABNORMAL LOW (ref 4.0–10.5)
nRBC: 0 % (ref 0.0–0.2)
nRBC: 0 /100{WBCs}

## 2023-05-24 LAB — FOLATE: Folate: >40 ng/mL (ref >5.9)

## 2023-05-24 LAB — VITAMIN B12: Vitamin B-12: 406 pg/mL (ref 180–914)

## 2023-05-24 NOTE — Telephone Encounter (Signed)
 05/24/23 Next appt scheduled and confirmed with patient

## 2023-05-24 NOTE — Progress Notes (Signed)
 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 He was treated with radical surgical resection. P16 was negative negative. He has several concerning features on his pathology, including 1 positive node with extranodal extension and focal lymphovascular invasion. He was healing well, and then developed left facial pain and swelling, this occurred prior to initiating any chemoradiation. CT scan was nonspecific. MRI scan did not show any mass affect and was consistent with inflammation of the area. He completed the full course of radiation along with 7 cycles of chemotherapy with carboplatin and paclitaxel in  December 2023. He has had persistent left facial pain and swelling. This is felt to be postsurgical, just more pronounced than usually seen. He continues gabapentin, meloxicam and hydrocodone/APAP as needed, but is no longer taking tizanidine. Most recent CT imaging in February, 2025 revealed stable post-treatment appearance with no new or suspicious findings or pathologic lymphadenopathy.  Painful left facial swelling  This still persists but the pain has slowly improved over the last year.    Chronic Back Pain Spinal stenosis with 4 back surgeries including vertebral fusion. He has chronic pain and neuropathy, and is followed by [facility]. Another surgery is planned.  Folate Deficiency  He has been on supplements since last year so I will recheck a level to see if it can be stopped now.    Plan: His back surgery was postponed as he needed a new stent placement in the right neck/carotid. He has already had this done on the left side previously after a stroke. The patient informed me that he had quit smoking in October, 2024. He had a CT neck done  on 05/16/2023 that showed stable post treatment appearance of the neck and no evidence of recurrent disease. Patient also had labs done on 05/12/2023 and had a low WBC of 2.5, a low hemoglobin of 12.6, and platelet count of 162,000. His CMP, calcium, phosphorus, magnesium, and lipid panel were all normal. Labs today reveal an improved low WBC of 3.7, hemoglobin up to 14.5, and platelet count of 191,000. His CMP is completely normal. I will add folate and B-12 levels to his labs today. He is on folate supplement due to deficiency last year and we will see if he needs to stay on this. I will see him back in 3 months with CBC and CMP. He will have repeat CT of the neck in 6 months. The patient understands the plans discussed today and is in agreement with them.  He knows to contact our office if he develops concerns prior to his next appointment.  I provided 13 minutes of face-to-face time during this encounter and > 50% was spent counseling as documented under my assessment and plan.   Ian Beckwith, MD  [facility] - A DEPT OF [facility] [address] [place] [address] Dept: [phone] Dept Fax: [phone]   No orders of the defined types were placed in this encounter.   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  Current Treatment:  Surveillance  HISTORY OF PRESENT ILLNESS:   Oncology History  Oropharyngeal carcinoma (HCC)  11/06/2021 Initial Diagnosis   Oropharyngeal carcinoma (HCC)   11/06/2021 Cancer Staging   Staging form:  Pharynx - P16 Negative Oropharynx, AJCC 8th Edition - Clinical stage from 11/06/2021: Stage IVB (cT1, cN3, cM0, p16-) - Signed by Ian Beckwith, MD on 12/28/2021 Histopathologic type: Squamous cell carcinoma, NOS Stage prefix: Initial diagnosis Histologic grade (G): G2 Histologic grading system: 4 grade system Tumor size (mm): 13 Lymph-vascular invasion (LVI): LVI  present/identified, NOS Diagnostic confirmation: Positive histology Specimen type: Excision Staged by: Managing physician Presence of extranodal extension: Present Extent of extranodal extension: Microscopic ENE(+) Distance of extension from the native lymph node capsule to the farthest point of invasion in the extranodal tissue (mm): 6.5 ECOG performance status: Grade 1 Perineural invasion (PNI): Absent Prognostic indicators: 1.3 cm with 1/23 nodes positive -2.3 cm with ENE and focal LVI Stage used in treatment planning: Yes National guidelines used in treatment planning: Yes Type of national guideline used in treatment planning: NCCN   01/06/2022 - 03/16/2022 Chemotherapy   Patient is on Treatment Plan : HEAD/NECK Carboplatin + Paclitaxel + XRT q7d     01/19/2022 Imaging   CT neck:  IMPRESSION:  1. 26 x 11 x 11 mm soft tissue mass posterior to the left  submandibular gland and anterior to the SCM. Some residual soft  tissue is noted in the PET scan. While this may represent  postoperative scar tissue, it is concerning for residual or  recurrent tumor.  2. Asymmetric soft tissue at the left glossal tonsillar sulcus with  focal surgical clips. No definite mass lesion is present.  3. Left carotid stent is in place. Vessel appears patent.  4. Atherosclerotic calcifications at the right carotid bifurcation  with what appears to be a high-grade proximal right ICA stenosis.  5. Right IJ Port-A-Cath in place.        INTERVAL HISTORY:  Ian Case is here today for repeat clinical assessment for hiistory of stage IVB oropharyngeal cancer diagnosed in August, 2023. Patient states that he does not feel the best. He complains of persistent swelling and erythema of his left face/neck, but the pain is less. He has severe back pain and his back surgery was postponed as he needed a new stent placement in the right neck/carotid artery. He has already had this done on the left side previously after a  stroke. The patient informed me that he had quit smoking in October, 2024. He had a CT neck done on 05/16/2023 that showed stable post treatment appearance of the neck and no evidence of recurrent disease. Patient also had labs done on 05/12/2023 and had a low WBC of 2.5, a low hemoglobin of 12.6, and platelet count of 162,000. His CMP, calcium, phosphorus, magnesium, and lipid panel were all normal. Labs today reveal a improved low WBC of 3.7, hemoglobin up to 14.5, and platelet count of 191,000. His CMP is completely normal. I will add folate and B-12 levels to his labs today. He is on folate due to deficiency last year and we will see if he needs to stay on it. I will see him back in 3 months with CBC and CMP. He will have repeat CT of the neck in 6 months.   He denies signs of infection such as sore throat, sinus drainage, cough, or urinary symptoms.  He denies fevers or recurrent chills. He denies nausea, vomiting, chest pain, dyspnea or cough. His appetite is very good and his weight has decreased 2 pounds over last 2 months .   REVIEW OF SYSTEMS:  Review of Systems  Constitutional:  Negative for appetite change, chills, diaphoresis,  fatigue, fever and unexpected weight change.  HENT:   Negative for hearing loss, lump/mass, mouth sores, nosebleeds, sore throat, tinnitus and voice change.        Persistent left facial swelling and pain, trouble chewing solids  Eyes: Negative.   Respiratory: Negative.  Negative for chest tightness, cough, hemoptysis, shortness of breath and wheezing.   Cardiovascular: Negative.  Negative for chest pain, leg swelling and palpitations.  Gastrointestinal: Negative.  Negative for abdominal distention, abdominal pain, blood in stool, constipation, diarrhea, nausea and vomiting.  Endocrine: Negative.  Negative for hot flashes.  Genitourinary: Negative.  Negative for difficulty urinating, dysuria, frequency and hematuria.   Musculoskeletal:  Positive for back pain.  Negative for arthralgias, flank pain, gait problem and myalgias.  Skin: Negative.  Negative for itching, rash and wound.  Neurological: Negative.  Negative for dizziness, extremity weakness, gait problem, headaches, light-headedness, numbness, seizures and speech difficulty.  Hematological: Negative.  Negative for adenopathy. Does not bruise/bleed easily.  Psychiatric/Behavioral: Negative.  Negative for depression and sleep disturbance. The patient is not nervous/anxious.      VITALS:  Blood pressure (!) 166/96, pulse (!) 104, temperature 98.4 F (36.9 C), temperature source Oral, resp. rate 18, height 5\' 9"  (1.753 m), weight 272 lb 4.8 oz (123.5 kg), SpO2 98%.  Wt Readings from Last 3 Encounters:  05/24/23 272 lb 4.8 oz (123.5 kg)  03/22/23 274 lb 9.6 oz (124.6 kg)  12/16/22 258 lb 14.4 oz (117.4 kg)    Body mass index is 40.21 kg/m.  Performance status (ECOG): 1 - Symptomatic but completely ambulatory  PHYSICAL EXAM:  Physical Exam Vitals and nursing note reviewed.  Constitutional:      General: He is not in acute distress.    Appearance: Normal appearance. He is normal weight. He is not ill-appearing, toxic-appearing or diaphoretic.  HENT:     Head: Normocephalic and atraumatic.     Right Ear: Tympanic membrane, ear canal and external ear normal. There is no impacted cerumen.     Left Ear: Tympanic membrane, ear canal and external ear normal. There is no impacted cerumen.     Nose: Nose normal. No congestion or rhinorrhea.     Mouth/Throat:     Mouth: Mucous membranes are moist.     Pharynx: Oropharynx is clear. No oropharyngeal exudate or posterior oropharyngeal erythema.     Comments: Stable, persistent mild swelling of the left oropharyngeal wall Eyes:     General: No scleral icterus.    Extraocular Movements: Extraocular movements intact.     Conjunctiva/sclera: Conjunctivae normal.     Pupils: Pupils are equal, round, and reactive to light.  Neck:     Comments: Mild  left facial swelling and erythema Swelling of the left neck no palpable adenopathy Cardiovascular:     Rate and Rhythm: Normal rate and regular rhythm.     Heart sounds: Normal heart sounds. No murmur heard.    No friction rub. No gallop.  Pulmonary:     Effort: Pulmonary effort is normal.     Breath sounds: Normal breath sounds. No stridor. No wheezing, rhonchi or rales.  Chest:     Chest wall: No tenderness.  Abdominal:     General: Bowel sounds are normal. There is no distension.     Palpations: Abdomen is soft. There is no mass.     Tenderness: There is no abdominal tenderness. There is no right CVA tenderness, left CVA tenderness, guarding or rebound.     Hernia: No hernia  is present.  Musculoskeletal:        General: Normal range of motion.     Cervical back: Normal range of motion and neck supple. No tenderness.     Right lower leg: No edema.     Left lower leg: No edema.  Lymphadenopathy:     Cervical: No cervical adenopathy.  Skin:    General: Skin is warm and dry.     Coloration: Skin is not jaundiced or pale.     Findings: No bruising, erythema, lesion or rash.  Neurological:     General: No focal deficit present.     Mental Status: He is alert and oriented to person, place, and time. Mental status is at baseline.     Cranial Nerves: No cranial nerve deficit.     Sensory: No sensory deficit.     Motor: No weakness.     Coordination: Coordination normal.     Gait: Gait normal.     Deep Tendon Reflexes: Reflexes normal.  Psychiatric:        Mood and Affect: Mood normal.        Behavior: Behavior normal.        Thought Content: Thought content normal.        Judgment: Judgment normal.     LABS:      Latest Ref Rng & Units 05/24/2023    2:49 PM 03/22/2023   10:28 AM 12/14/2022   12:00 AM  CBC  WBC 4.0 - 10.5 K/uL 3.7  4.8  3.4      Hemoglobin 13.0 - 17.0 g/dL 29.5  28.4  13.2      Hematocrit 39.0 - 52.0 % 39.9  37.0  39      Platelets 150 - 400 K/uL 191   200  159         This result is from an external source.      Latest Ref Rng & Units 05/24/2023    2:49 PM 03/22/2023   10:28 AM 12/14/2022   12:16 PM  CMP  Glucose 70 - 99 mg/dL 92  440    BUN 6 - 20 mg/dL 19  13    Creatinine 1.02 - 1.24 mg/dL 7.25  3.66    Sodium 440 - 145 mmol/L 137  139    Potassium 3.5 - 5.1 mmol/L 3.8  3.6    Chloride 98 - 111 mmol/L 99  101    CO2 22 - 32 mmol/L 23  24    Calcium 8.9 - 10.3 mg/dL 9.9  9.4  9.9      Total Protein 6.5 - 8.1 g/dL 7.7  7.0    Total Bilirubin 0.0 - 1.2 mg/dL 0.5  0.4    Alkaline Phos 38 - 126 U/L 74  69  93      AST 15 - 41 U/L 41  35  40      ALT 0 - 44 U/L 29  32  30         This result is from an external source.    Lab Results  Component Value Date   TIBC 354 05/18/2022   IRONPCTSAT 31 05/18/2022     STUDIES:  CT Soft Tissue Neck W Contrast Result Date: 05/16/2023 CLINICAL DATA:  Treated oro pharyngeal cancer; involvement of left-sided tongue and left tonsil EXAM: CT NECK WITH CONTRAST TECHNIQUE: Multidetector CT imaging of the neck was performed using the standard protocol following the bolus administration of intravenous contrast. RADIATION DOSE REDUCTION:  This exam was performed according to the departmental dose-optimization program which includes automated exposure control, adjustment of the mA and/or kV according to patient size and/or use of iterative reconstruction technique. CONTRAST:  75mL OMNIPAQUE IOHEXOL 300 MG/ML  SOLN COMPARISON:  12/14/2022 FINDINGS: Pharynx and larynx: Post treatment distortion with volume loss involving the left oropharynx. Suboptimal mucosal enhancement, no metachronous lesion seen. Salivary glands: Fatty infiltration especially affecting the submandibular glands. Postoperative distortion of soft tissue planes around the left submandibular gland which is unchanged. Thyroid: Normal. Lymph nodes: None enlarged or heterogeneous. Stable non masslike architectural distortion around clips in the  left neck. Vascular: Scattered atheromatous calcification.  Left ICA stenting. Limited intracranial: Chronic small vessel infarcts in the deep white matter, deep gray, and pons. Chronic left MCA branch infarct affecting the frontal lobe, partially covered. Visualized orbits: Unremarkable Mastoids and visualized paranasal sinuses: No significant finding Skeleton: No destructive lesion. Upper chest: Few, tiny calcified nodules in the apical lungs. IMPRESSION: Stable post treatment neck.  No evidence of recurrent disease. Electronically Signed   By: Tiburcio Pea M.D.   On: 05/16/2023 09:05    HISTORY:   Past Medical History:  Diagnosis Date   Arthritis    Back pain    GERD (gastroesophageal reflux disease)    History of kidney stones    Hypertension    Sleep apnea    does not have cpap yet    Past Surgical History:  Procedure Laterality Date   APPENDECTOMY     HAND SURGERY     LUMBAR LAMINECTOMY/DECOMPRESSION MICRODISCECTOMY N/A 12/28/2017   Procedure: Laminectomy and Foraminotomy Lumbar two-three-Lumbar three-four - Lumbar four-five;  Surgeon: Donalee Citrin, MD;  Location: [facility] OR;  Service: Neurosurgery;  Laterality: N/A;   OTHER SURGICAL HISTORY     removal on cancer from neck    Family History  Problem Relation Age of Onset   Thyroid disease Mother    Heart disease Father    Prostate cancer Brother    Healthy Daughter    Healthy Son     Social History:  reports that he quit smoking about 4 months ago. His smoking use included cigarettes. He has a 22 pack-year smoking history. He has never used smokeless tobacco. He reports that he does not currently use alcohol. He reports that he does not use drugs.The patient is alone today.  Allergies: No Known Allergies  Current Medications: Current Outpatient Medications  Medication Sig Dispense Refill   clopidogrel (PLAVIX) 75 MG tablet Take by mouth.     amitriptyline (ELAVIL) 75 MG tablet Take 1 tablet by mouth daily.     amLODipine  (NORVASC) 10 MG tablet Take 1 tablet by mouth every morning.     aspirin EC 81 MG tablet Take by mouth.     atorvastatin (LIPITOR) 80 MG tablet Take 80 mg by mouth daily.     chlorhexidine (PERIDEX) 0.12 % solution SMARTSIG:1 Capful(s) By Mouth Twice Daily (Patient not taking: Reported on 03/22/2023)     cyanocobalamin (VITAMIN B12) 1000 MCG/ML injection Inject 1 mL every month by subcutaneous route for 1 day.     docusate sodium (COLACE) 50 MG capsule Take by mouth.     fenofibrate 54 MG tablet TAKE ONE TABLET BY MOUTH EVERY DAY FOR 30 DAYS     folic acid (FOLVITE) 1 MG tablet Take 1 tablet (1 mg total) by mouth daily. 30 tablet 5   gabapentin (NEURONTIN) 800 MG tablet Take 800 mg by mouth 4 (four) times daily.  HYDROcodone-acetaminophen (NORCO) 10-325 MG tablet Take 1 tablet by mouth as directed.     meloxicam (MOBIC) 15 MG tablet Take 1 tablet (15 mg total) by mouth daily. 30 tablet 5   nitroGLYCERIN (NITROSTAT) 0.4 MG SL tablet PLACE 1 TABLET UNDER THE TONGUE EVERY 5 MIN FOR CHEST PAIN AS NEEDED. NO MORE THAN 3 TABS IN 15 MIN.     pantoprazole (PROTONIX) 40 MG tablet Take 1 tablet by mouth daily.     pentoxifylline (TRENTAL) 400 MG CR tablet take one tablet BY MOUTH IN THE MORNING, AND take one tablet AT NOON, AND take one tablet IN THE EVENING. take with meals.     phentermine (ADIPEX-P) 37.5 MG tablet Take 37.5 mg by mouth daily. (Patient not taking: Reported on 03/22/2023)     prochlorperazine (COMPAZINE) 10 MG tablet Take 10 mg by mouth every 6 (six) hours as needed.     senna-docusate (SENOKOT-S) 8.6-50 MG tablet Take by mouth.     tiZANidine (ZANAFLEX) 4 MG tablet Take by mouth. (Patient not taking: Reported on 03/22/2023)     triamterene-hydrochlorothiazide (MAXZIDE-25) 37.5-25 MG tablet Take 1 tablet by mouth daily.     No current facility-administered medications for this visit.     I,Jasmine M Lassiter,acting as a scribe for Ian Beckwith, MD.,have documented all  relevant documentation on the behalf of Ian Beckwith, MD,as directed by  Ian Beckwith, MD while in the presence of Ian Beckwith, MD.

## 2023-05-28 ENCOUNTER — Encounter: Payer: Self-pay | Admitting: Oncology

## 2023-05-30 ENCOUNTER — Telehealth: Payer: Self-pay

## 2023-05-30 NOTE — Telephone Encounter (Signed)
-----   Message from Dellia Beckwith sent at 05/28/2023 10:47 AM EST ----- Regarding: call Tell him the folate level is high, he can stop the supplement, B12 normal

## 2023-05-30 NOTE — Telephone Encounter (Signed)
 Attempted to contact patient. No answer and no VM setup at this time.

## 2023-05-31 ENCOUNTER — Telehealth: Payer: Self-pay

## 2023-05-31 NOTE — Telephone Encounter (Signed)
 Attempted to call patient. Number says phone number is not available at this time.

## 2023-05-31 NOTE — Telephone Encounter (Signed)
-----   Message from Dellia Beckwith sent at 05/28/2023 10:47 AM EST ----- Regarding: call Tell him the folate level is high, he can stop the supplement, B12 normal

## 2023-06-01 ENCOUNTER — Other Ambulatory Visit: Payer: Medicare HMO

## 2023-06-01 ENCOUNTER — Ambulatory Visit: Payer: Medicare HMO | Admitting: Oncology

## 2023-06-01 ENCOUNTER — Telehealth: Payer: Self-pay

## 2023-06-01 NOTE — Telephone Encounter (Signed)
 Contacted patient and made him aware of message.

## 2023-06-01 NOTE — Telephone Encounter (Signed)
-----   Message from Dellia Beckwith sent at 05/28/2023 10:47 AM EST ----- Regarding: call Tell him the folate level is high, he can stop the supplement, B12 normal

## 2023-06-08 ENCOUNTER — Telehealth: Payer: Self-pay

## 2023-06-08 NOTE — Telephone Encounter (Signed)
-----   Message from Dellia Beckwith sent at 05/28/2023 10:47 AM EST ----- Regarding: call Tell him the folate level is high, he can stop the supplement, B12 normal

## 2023-08-23 ENCOUNTER — Inpatient Hospital Stay: Payer: Medicare HMO | Attending: Hematology and Oncology | Admitting: Oncology

## 2023-08-23 ENCOUNTER — Telehealth: Payer: Self-pay | Admitting: Oncology

## 2023-08-23 ENCOUNTER — Other Ambulatory Visit: Payer: Self-pay | Admitting: Oncology

## 2023-08-23 ENCOUNTER — Inpatient Hospital Stay: Payer: Medicare HMO

## 2023-08-23 ENCOUNTER — Encounter: Payer: Self-pay | Admitting: Oncology

## 2023-08-23 VITALS — BP 135/84 | HR 77 | Temp 98.2°F | Resp 18 | Ht 69.0 in | Wt 278.3 lb

## 2023-08-23 DIAGNOSIS — D72819 Decreased white blood cell count, unspecified: Secondary | ICD-10-CM | POA: Diagnosis not present

## 2023-08-23 DIAGNOSIS — G8929 Other chronic pain: Secondary | ICD-10-CM | POA: Insufficient documentation

## 2023-08-23 DIAGNOSIS — M549 Dorsalgia, unspecified: Secondary | ICD-10-CM | POA: Diagnosis not present

## 2023-08-23 DIAGNOSIS — Z79899 Other long term (current) drug therapy: Secondary | ICD-10-CM | POA: Diagnosis not present

## 2023-08-23 DIAGNOSIS — Z87891 Personal history of nicotine dependence: Secondary | ICD-10-CM | POA: Diagnosis not present

## 2023-08-23 DIAGNOSIS — Z923 Personal history of irradiation: Secondary | ICD-10-CM | POA: Insufficient documentation

## 2023-08-23 DIAGNOSIS — R609 Edema, unspecified: Secondary | ICD-10-CM | POA: Insufficient documentation

## 2023-08-23 DIAGNOSIS — E538 Deficiency of other specified B group vitamins: Secondary | ICD-10-CM | POA: Diagnosis not present

## 2023-08-23 DIAGNOSIS — C109 Malignant neoplasm of oropharynx, unspecified: Secondary | ICD-10-CM | POA: Insufficient documentation

## 2023-08-23 DIAGNOSIS — Z9221 Personal history of antineoplastic chemotherapy: Secondary | ICD-10-CM | POA: Diagnosis not present

## 2023-08-23 DIAGNOSIS — G629 Polyneuropathy, unspecified: Secondary | ICD-10-CM | POA: Diagnosis not present

## 2023-08-23 LAB — CMP (CANCER CENTER ONLY)
ALT: 24 U/L (ref 0–44)
AST: 30 U/L (ref 15–41)
Albumin: 4.3 g/dL (ref 3.5–5.0)
Alkaline Phosphatase: 79 U/L (ref 38–126)
Anion gap: 15 (ref 5–15)
BUN: 11 mg/dL (ref 6–20)
CO2: 24 mmol/L (ref 22–32)
Calcium: 10.1 mg/dL (ref 8.9–10.3)
Chloride: 100 mmol/L (ref 98–111)
Creatinine: 0.68 mg/dL (ref 0.61–1.24)
GFR, Estimated: >60 mL/min (ref >60)
Glucose, Bld: 98 mg/dL (ref 70–99)
Potassium: 3.5 mmol/L (ref 3.5–5.1)
Sodium: 139 mmol/L (ref 135–145)
Total Bilirubin: 0.2 mg/dL (ref 0.0–1.2)
Total Protein: 7.3 g/dL (ref 6.5–8.1)

## 2023-08-23 LAB — CBC WITH DIFFERENTIAL (CANCER CENTER ONLY)
Abs Immature Granulocytes: 0.02 10*3/uL (ref 0.00–0.07)
Basophils Absolute: 0 10*3/uL (ref 0.0–0.1)
Basophils Relative: 0 %
Eosinophils Absolute: 0 10*3/uL (ref 0.0–0.5)
Eosinophils Relative: 1 %
HCT: 39 % (ref 39.0–52.0)
Hemoglobin: 14.1 g/dL (ref 13.0–17.0)
Immature Granulocytes: 1 %
Lymphocytes Relative: 34 %
Lymphs Abs: 1.2 10*3/uL (ref 0.7–4.0)
MCH: 35.2 pg — ABNORMAL HIGH (ref 26.0–34.0)
MCHC: 36.2 g/dL — ABNORMAL HIGH (ref 30.0–36.0)
MCV: 97.3 fL (ref 80.0–100.0)
Monocytes Absolute: 0.5 10*3/uL (ref 0.1–1.0)
Monocytes Relative: 13 %
Neutro Abs: 1.7 10*3/uL (ref 1.7–7.7)
Neutrophils Relative %: 51 %
Platelet Count: 203 10*3/uL (ref 150–400)
RBC: 4.01 MIL/uL — ABNORMAL LOW (ref 4.22–5.81)
RDW: 13.4 % (ref 11.5–15.5)
WBC Count: 3.4 10*3/uL — ABNORMAL LOW (ref 4.0–10.5)
nRBC: 0 % (ref 0.0–0.2)

## 2023-08-23 NOTE — Progress Notes (Signed)
 [facility]  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 He was treated with radical surgical resection. P16 was negative. He has several concerning features on his pathology, including 1 positive node with extranodal extension and focal lymphovascular invasion. He was healing well, and then developed left facial pain and swelling, this occurred prior to initiating any chemoradiation. CT scan was nonspecific. MRI scan did not show any mass affect and was consistent with inflammation of the area. He completed the full course of radiation along with 7 cycles of chemotherapy with carboplatin  and paclitaxel  in  December 2023. He has had persistent left facial pain and swelling. This is felt to be postsurgical, just more pronounced than usually seen. He continues gabapentin, meloxicam  and hydrocodone /APAP as needed, but is no longer taking tizanidine . Most recent CT imaging in February, 2025 revealed stable post-treatment appearance with no new or suspicious findings or pathologic lymphadenopathy.  Painful left facial swelling  This still persists but the pain has slowly improved over the last year.   Chronic Back Pain Spinal stenosis with 4 back surgeries including vertebral fusion. He has chronic pain and neuropathy, and is followed by [facility]. Another surgery is planned for tomorrow.  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   Mild leukopenia I feel this is residual effect from his prior chemoradiation. I will monitor this. He does not have neutropenia   Plan: He informed me that he has back surgery scheduled tomorrow at [facility]. Dr. Rudine Cos has been doing  periodic nasopharyngoscopy. He has a low WBC of 3.4 with an ANC of 1700, hemoglobin of 14.1, and platelet count of 203,000. His CMP is normal although he has a low-normal potassium of 3.5. I instructed him to increase potassium in his diet, especially before surgery. I will see him back in 3 months with CBC, CMP, and CT neck. The patient understands the plans discussed today and is in agreement with them.  He knows to contact our office if he develops concerns prior to his next appointment.  I provided 16 minutes of face-to-face time during this encounter and > 50% was spent counseling as documented under my assessment and plan.   Nolia Baumgartner, MD  [facility] DE[facility] OF [facility] [address] [place] [address] Dept: [phone] Dept Fax: [phone]   No orders of the defined types were placed in this encounter.   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  Current Treatment:  Surveillance  HISTORY OF PRESENT ILLNESS:   Oncology History  Oropharyngeal carcinoma (HCC)  11/06/2021 Initial Diagnosis   Oropharyngeal carcinoma (HCC)   11/06/2021 Cancer Staging   Staging form: Pharynx - P16 Negative Oropharynx, AJCC 8th Edition - Clinical stage from 11/06/2021: Stage IVB (cT1, cN3, cM0, p16-) - Signed by Nolia Baumgartner, MD on 12/28/2021 Histopathologic type: Squamous cell carcinoma, NOS Stage prefix: Initial diagnosis Histologic grade (G): G2 Histologic grading system: 4 grade system Tumor size (mm): 13 Lymph-vascular invasion (LVI): LVI present/identified, NOS Diagnostic confirmation: Positive histology Specimen type: Excision Staged by: Managing physician Presence of extranodal extension: Present Extent of extranodal extension: Microscopic ENE(+)  Distance of extension from the native lymph node capsule to the farthest point of invasion in the extranodal tissue (mm): 6.5 ECOG performance  status: Grade 1 Perineural invasion (PNI): Absent Prognostic indicators: 1.3 cm with 1/23 nodes positive -2.3 cm with ENE and focal LVI Stage used in treatment planning: Yes National guidelines used in treatment planning: Yes Type of national guideline used in treatment planning: NCCN   01/06/2022 - 03/16/2022 Chemotherapy   Patient is on Treatment Plan : HEAD/NECK Carboplatin  + Paclitaxel  + XRT q7d     01/19/2022 Imaging   CT neck:  IMPRESSION:  1. 26 x 11 x 11 mm soft tissue mass posterior to the left  submandibular gland and anterior to the SCM. Some residual soft  tissue is noted in the PET scan. While this may represent  postoperative scar tissue, it is concerning for residual or  recurrent tumor.  2. Asymmetric soft tissue at the left glossal tonsillar sulcus with  focal surgical clips. No definite mass lesion is present.  3. Left carotid stent is in place. Vessel appears patent.  4. Atherosclerotic calcifications at the right carotid bifurcation  with what appears to be a high-grade proximal right ICA stenosis.  5. Right IJ Port-A-Cath in place.      INTERVAL HISTORY:  Ian Case is here today for repeat clinical assessment for hiistory of stage IVB oropharyngeal cancer diagnosed in August, 2023. Patient states that he feels ok but complains of trouble swallowing, left sided facial pain, and severe lower back pain rating a 5/10 at this moment. He informed me that he has back surgery scheduled tomorrow at [facility]. Dr. Rudine Cos has been doing periodic nasopharyngoscopy. He has a low WBC of 3.4 with an ANC of 1700, hemoglobin of 14.1, and platelet count of 203,000. His CMP is normal although he has a low-normal potassium of 3.5. I instructed him to increase potassium in his diet, especially before surgery. I will see him back in 3 months with CBC, CMP, and CT neck. He denies fever, chills, night sweats, or other signs of infection. He denies cardiorespiratory and gastrointestinal issues.  His appetite is very good and His weight has increased 6 pounds over last 3 months.    REVIEW OF SYSTEMS:  Review of Systems  Constitutional:  Negative for appetite change, chills, diaphoresis, fatigue, fever and unexpected weight change.  HENT:   Positive for trouble swallowing. Negative for hearing loss, lump/mass, mouth sores, nosebleeds, sore throat, tinnitus and voice change.        Persistent left facial swelling and pain, trouble chewing solids  Eyes: Negative.   Respiratory: Negative.  Negative for chest tightness, cough, hemoptysis, shortness of breath and wheezing.   Cardiovascular: Negative.  Negative for chest pain, leg swelling and palpitations.  Gastrointestinal: Negative.  Negative for abdominal distention, abdominal pain, blood in stool, constipation, diarrhea, nausea and vomiting.  Endocrine: Negative.  Negative for hot flashes.  Genitourinary: Negative.  Negative for difficulty urinating, dysuria, frequency and hematuria.   Musculoskeletal:  Positive for back pain (lower, 5/10). Negative for arthralgias, flank pain, gait problem and myalgias.       Left sided facial pain  Skin: Negative.  Negative for itching, rash and wound.  Neurological: Negative.  Negative for dizziness, extremity weakness, gait problem, headaches, light-headedness, numbness, seizures and speech difficulty.  Hematological: Negative.  Negative for adenopathy. Does not bruise/bleed easily.  Psychiatric/Behavioral: Negative.  Negative for depression and sleep disturbance. The patient is not nervous/anxious.     VITALS:  Blood  pressure 135/84, pulse 77, temperature 98.2 F (36.8 C), temperature source Oral, resp. rate 18, height 5\' 9"  (1.753 m), weight 278 lb 4.8 oz (126.2 kg), SpO2 98%.  Wt Readings from Last 3 Encounters:  08/23/23 278 lb 4.8 oz (126.2 kg)  05/24/23 272 lb 4.8 oz (123.5 kg)  03/22/23 274 lb 9.6 oz (124.6 kg)    Body mass index is 41.1 kg/m.  Performance status (ECOG): 1 -  Symptomatic but completely ambulatory  PHYSICAL EXAM:  Physical Exam Vitals and nursing note reviewed.  Constitutional:      General: He is not in acute distress.    Appearance: Normal appearance. He is normal weight. He is not ill-appearing, toxic-appearing or diaphoretic.  HENT:     Head: Normocephalic and atraumatic.     Right Ear: Tympanic membrane, ear canal and external ear normal. There is no impacted cerumen.     Left Ear: Tympanic membrane, ear canal and external ear normal. There is no impacted cerumen.     Nose: Nose normal. No congestion or rhinorrhea.     Mouth/Throat:     Mouth: Mucous membranes are moist.     Pharynx: Oropharynx is clear. No oropharyngeal exudate or posterior oropharyngeal erythema.  Eyes:     General: No scleral icterus.    Extraocular Movements: Extraocular movements intact.     Conjunctiva/sclera: Conjunctivae normal.     Pupils: Pupils are equal, round, and reactive to light.  Neck:     Comments: Extensive scar of the left neck which is well healed Swelling and erythema of the left face Mild induration the neck Cardiovascular:     Rate and Rhythm: Normal rate and regular rhythm.     Heart sounds: Normal heart sounds. No murmur heard.    No friction rub. No gallop.  Pulmonary:     Effort: Pulmonary effort is normal.     Breath sounds: Normal breath sounds. No stridor. No wheezing, rhonchi or rales.  Chest:     Chest wall: No tenderness.  Abdominal:     General: Bowel sounds are normal. There is no distension.     Palpations: Abdomen is soft. There is no mass.     Tenderness: There is no abdominal tenderness. There is no right CVA tenderness, left CVA tenderness, guarding or rebound.     Hernia: No hernia is present.  Musculoskeletal:        General: Normal range of motion.     Cervical back: Normal range of motion and neck supple. No tenderness.     Right lower leg: Edema present.     Left lower leg: Edema present.     Comments: Mild    Lymphadenopathy:     Cervical: No cervical adenopathy.  Skin:    General: Skin is warm and dry.     Coloration: Skin is not jaundiced or pale.     Findings: No bruising, erythema, lesion or rash.  Neurological:     General: No focal deficit present.     Mental Status: He is alert and oriented to person, place, and time. Mental status is at baseline.     Cranial Nerves: No cranial nerve deficit.     Sensory: No sensory deficit.     Motor: No weakness.     Coordination: Coordination normal.     Gait: Gait normal.     Deep Tendon Reflexes: Reflexes normal.  Psychiatric:        Mood and Affect: Mood normal.  Behavior: Behavior normal.        Thought Content: Thought content normal.        Judgment: Judgment normal.     LABS:      Latest Ref Rng & Units 08/23/2023    2:05 PM 05/24/2023    2:49 PM 03/22/2023   10:28 AM  CBC  WBC 4.0 - 10.5 K/uL 3.4  3.7  4.8   Hemoglobin 13.0 - 17.0 g/dL 04.5  40.9  81.1   Hematocrit 39.0 - 52.0 % 39.0  39.9  37.0   Platelets 150 - 400 K/uL 203  191  200       Latest Ref Rng & Units 08/23/2023    2:05 PM 05/24/2023    2:49 PM 03/22/2023   10:28 AM  CMP  Glucose 70 - 99 mg/dL 98  92  914   BUN 6 - 20 mg/dL 11  19  13    Creatinine 0.61 - 1.24 mg/dL 7.82  9.56  2.13   Sodium 135 - 145 mmol/L 139  137  139   Potassium 3.5 - 5.1 mmol/L 3.5  3.8  3.6   Chloride 98 - 111 mmol/L 100  99  101   CO2 22 - 32 mmol/L 24  23  24    Calcium 8.9 - 10.3 mg/dL 08.6  9.9  9.4   Total Protein 6.5 - 8.1 g/dL 7.3  7.7  7.0   Total Bilirubin 0.0 - 1.2 mg/dL 0.2  0.5  0.4   Alkaline Phos 38 - 126 U/L 79  74  69   AST 15 - 41 U/L 30  41  35   ALT 0 - 44 U/L 24  29  32    Lab Results  Component Value Date   TIBC 354 05/18/2022   IRONPCTSAT 31 05/18/2022   Lab Results  Component Value Date   VITAMINB12 406 05/24/2023   Lab Results  Component Value Date   FOLATE >40.0 05/24/2023     STUDIES:  EXAM: 04/08/2023 CT NECK WITH  CONTRAST IMPRESSION: Stable post treatment neck.  No evidence of recurrent disease.     HISTORY:   Past Medical History:  Diagnosis Date   Arthritis    Back pain    GERD (gastroesophageal reflux disease)    History of kidney stones    Hypertension    Sleep apnea    does not have cpap yet    Past Surgical History:  Procedure Laterality Date   APPENDECTOMY     HAND SURGERY     LUMBAR LAMINECTOMY/DECOMPRESSION MICRODISCECTOMY N/A 12/28/2017   Procedure: Laminectomy and Foraminotomy Lumbar two-three-Lumbar three-four - Lumbar four-five;  Surgeon: Gearl Keens, MD;  Location: [facility];  Service: Neurosurgery;  Laterality: N/A;   OTHER SURGICAL HISTORY     removal on cancer from neck    Family History  Problem Relation Age of Onset   Thyroid disease Mother    Heart disease Father    Prostate cancer Brother    Healthy Daughter    Healthy Son     Social History:  reports that he quit smoking about 7 months ago. His smoking use included cigarettes. He has a 22 pack-year smoking history. He has never used smokeless tobacco. He reports that he does not currently use alcohol. He reports that he does not use drugs.The patient is alone today.  Allergies: No Known Allergies  Current Medications: Current Outpatient Medications  Medication Sig Dispense Refill   amitriptyline (ELAVIL) 75 MG tablet  Take 1 tablet by mouth daily.     amLODipine (NORVASC) 10 MG tablet Take 1 tablet by mouth every morning.     aspirin EC 81 MG tablet Take by mouth.     atorvastatin (LIPITOR) 80 MG tablet Take 80 mg by mouth daily.     chlorhexidine  (PERIDEX ) 0.12 % solution SMARTSIG:1 Capful(s) By Mouth Twice Daily (Patient not taking: Reported on 03/22/2023)     clopidogrel (PLAVIX) 75 MG tablet Take by mouth.     cyanocobalamin  (VITAMIN B12) 1000 MCG/ML injection Inject 1 mL every month by subcutaneous route for 1 day.     docusate sodium  (COLACE) 50 MG capsule Take by mouth.     fenofibrate 54 MG tablet  TAKE ONE TABLET BY MOUTH EVERY DAY FOR 30 DAYS     gabapentin (NEURONTIN) 800 MG tablet Take 800 mg by mouth 4 (four) times daily.     HYDROcodone -acetaminophen  (NORCO) 10-325 MG tablet Take 1 tablet by mouth as directed.     meloxicam  (MOBIC ) 15 MG tablet Take 1 tablet (15 mg total) by mouth daily. 30 tablet 5   nitroGLYCERIN (NITROSTAT) 0.4 MG SL tablet PLACE 1 TABLET UNDER THE TONGUE EVERY 5 MIN FOR CHEST PAIN AS NEEDED. NO MORE THAN 3 TABS IN 15 MIN.     pantoprazole  (PROTONIX ) 40 MG tablet Take 1 tablet by mouth daily.     pentoxifylline (TRENTAL) 400 MG CR tablet take one tablet BY MOUTH IN THE MORNING, AND take one tablet AT NOON, AND take one tablet IN THE EVENING. take with meals.     phentermine (ADIPEX-P) 37.5 MG tablet Take 37.5 mg by mouth daily. (Patient not taking: Reported on 03/22/2023)     prochlorperazine  (COMPAZINE ) 10 MG tablet Take 10 mg by mouth every 6 (six) hours as needed.     senna-docusate (SENOKOT-S) 8.6-50 MG tablet Take by mouth.     tiZANidine  (ZANAFLEX ) 4 MG tablet Take by mouth. (Patient not taking: Reported on 03/22/2023)     triamterene -hydrochlorothiazide  (MAXZIDE -25) 37.5-25 MG tablet Take 1 tablet by mouth daily.     No current facility-administered medications for this visit.     I,Jasmine M Lassiter,acting as a scribe for Nolia Baumgartner, MD.,have documented all relevant documentation on the behalf of Nolia Baumgartner, MD,as directed by  Nolia Baumgartner, MD while in the presence of Nolia Baumgartner, MD.

## 2023-08-23 NOTE — Telephone Encounter (Signed)
 Patient has been scheduled for follow-up visit per 08/22/23 LOS.  Pt given an appt calendar with date and time.

## 2023-08-29 ENCOUNTER — Encounter: Payer: Self-pay | Admitting: Oncology

## 2023-11-16 ENCOUNTER — Inpatient Hospital Stay

## 2023-11-17 ENCOUNTER — Ambulatory Visit (HOSPITAL_BASED_OUTPATIENT_CLINIC_OR_DEPARTMENT_OTHER)
Admission: RE | Admit: 2023-11-17 | Discharge: 2023-11-17 | Disposition: A | Discharge status: Home / Self Care | Source: Ambulatory Visit | Attending: Oncology | Admitting: Oncology

## 2023-11-17 ENCOUNTER — Other Ambulatory Visit (HOSPITAL_BASED_OUTPATIENT_CLINIC_OR_DEPARTMENT_OTHER): Admitting: Radiology

## 2023-11-17 ENCOUNTER — Inpatient Hospital Stay: Attending: Hematology and Oncology

## 2023-11-17 DIAGNOSIS — Z79899 Other long term (current) drug therapy: Secondary | ICD-10-CM | POA: Diagnosis not present

## 2023-11-17 DIAGNOSIS — C109 Malignant neoplasm of oropharynx, unspecified: Secondary | ICD-10-CM

## 2023-11-17 DIAGNOSIS — Z87891 Personal history of nicotine dependence: Secondary | ICD-10-CM | POA: Diagnosis not present

## 2023-11-17 LAB — CBC WITH DIFFERENTIAL (CANCER CENTER ONLY)
Abs Immature Granulocytes: 0.02 K/uL (ref 0.00–0.07)
Basophils Absolute: 0 K/uL (ref 0.0–0.1)
Basophils Relative: 0 %
Eosinophils Absolute: 0 K/uL (ref 0.0–0.5)
Eosinophils Relative: 1 %
HCT: 40.7 % (ref 39.0–52.0)
Hemoglobin: 13.4 g/dL (ref 13.0–17.0)
Immature Granulocytes: 1 %
Lymphocytes Relative: 28 %
Lymphs Abs: 1.1 K/uL (ref 0.7–4.0)
MCH: 30.2 pg (ref 26.0–34.0)
MCHC: 32.9 g/dL (ref 30.0–36.0)
MCV: 91.9 fL (ref 80.0–100.0)
Monocytes Absolute: 0.5 K/uL (ref 0.1–1.0)
Monocytes Relative: 12 %
Neutro Abs: 2.3 K/uL (ref 1.7–7.7)
Neutrophils Relative %: 58 %
Platelet Count: 216 K/uL (ref 150–400)
RBC: 4.43 MIL/uL (ref 4.22–5.81)
RDW: 15.7 % — ABNORMAL HIGH (ref 11.5–15.5)
WBC Count: 3.9 K/uL — ABNORMAL LOW (ref 4.0–10.5)
nRBC: 0 % (ref 0.0–0.2)

## 2023-11-17 LAB — CMP (CANCER CENTER ONLY)
ALT: 18 U/L (ref 0–44)
AST: 26 U/L (ref 15–41)
Albumin: 4.4 g/dL (ref 3.5–5.0)
Alkaline Phosphatase: 82 U/L (ref 38–126)
Anion gap: 14 (ref 5–15)
BUN: 13 mg/dL (ref 6–20)
CO2: 22 mmol/L (ref 22–32)
Calcium: 10 mg/dL (ref 8.9–10.3)
Chloride: 99 mmol/L (ref 98–111)
Creatinine: 0.73 mg/dL (ref 0.61–1.24)
GFR, Estimated: >60 mL/min (ref >60)
Glucose, Bld: 96 mg/dL (ref 70–99)
Potassium: 3.6 mmol/L (ref 3.5–5.1)
Sodium: 135 mmol/L (ref 135–145)
Total Bilirubin: 0.3 mg/dL (ref 0.0–1.2)
Total Protein: 7.6 g/dL (ref 6.5–8.1)

## 2023-11-17 MED ORDER — IOHEXOL 300 MG/ML  SOLN
100.0000 mL | Freq: Once | INTRAMUSCULAR | Status: AC | PRN
  Administered 2023-11-17: 75 mL via INTRAVENOUS

## 2023-11-22 NOTE — Progress Notes (Signed)
 [facility]  [address] 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 He was treated with radical surgical resection. P16 was negative. He had several concerning features on his pathology, including 1 positive node with extranodal extension and focal lymphovascular invasion. He was healing well, and then developed left facial pain and swelling, this occurred prior to initiating any chemoradiation. CT scan was nonspecific. MRI scan did not show any mass affect and was consistent with inflammation of the area. He completed the full course of radiation along with 7 cycles of chemotherapy with carboplatin  and paclitaxel  in  December 2023. He has had persistent left facial pain and swelling. This is felt to be postsurgical, just more pronounced than usually seen. CT neck done on 11/17/2023 revealed stable post treatment appearance of the oropharynx and neck and no evidence of recurrent disease.   Painful left facial swelling  This still persists but the pain has slowly improved over the last year.   Chronic Back Pain Spinal stenosis with 4 back surgeries including vertebral fusion. He has chronic pain and neuropathy, and is followed by [facility]. He had surgery in May and has had definite improvement.     Mild leukopenia I feel this is residual effect from his prior chemoradiation. B-12 and folate levels were normal. I will monitor this. He does not have neutropenia  Plan: He informed me that he had back surgery back at the end of May and he states that he is healing fine besides having back pain especially when walking. He recently started smoking again last month about half a pack a day. I encouraged him and reminded him of the importance of quitting smoking, especially since  he has survived a very advanced cancer. He had a CT neck done on 11/17/2023 which revealed stable post treatment appearance of the oropharynx and neck and no evidence of recurrent disease. He will meet with an ENT at the end of the month. He had labs done on 11/17/2023 and had a low WBC of 3.9 with an ANC of 2300, hemoglobin of 13.4, and platelet count of 216,000. His CMP was completely normal also. I will see him back in 3 months with CBC, CMP, and we will repeat a CT scan in 6 months. The patient understands the plans discussed today and is in agreement with them.  He knows to contact our office if he develops concerns prior to his next appointment.  I provided 14 minutes of face-to-face time during this encounter and > 50% was spent counseling as documented under my assessment and plan.   Wanda VEAR Cornish, MD  [facility] DE[facility] OF [facility] [address] [place] [address] Dept: [phone] Dept Fax: [phone]   No orders of the defined types were placed in this encounter.   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  Current Treatment:  Surveillance  HISTORY OF PRESENT ILLNESS:   Oncology History  Oropharyngeal carcinoma (HCC)  11/06/2021 Initial Diagnosis   Oropharyngeal carcinoma (HCC)   11/06/2021 Cancer Staging   Staging form: Pharynx - P16 Negative Oropharynx, AJCC 8th Edition - Clinical stage from 11/06/2021: Stage IVB (cT1, cN3, cM0, p16-) - Signed by Cornish Wanda VEAR, MD on 12/28/2021 Histopathologic type: Squamous  cell carcinoma, NOS Stage prefix: Initial diagnosis Histologic grade (G): G2 Histologic grading system: 4 grade system Tumor size (mm): 13 Lymph-vascular invasion (LVI): LVI present/identified, NOS Diagnostic confirmation: Positive histology Specimen type: Excision Staged by: Managing physician Presence of extranodal extension: Present Extent of extranodal extension:  Microscopic ENE(+) Distance of extension from the native lymph node capsule to the farthest point of invasion in the extranodal tissue (mm): 6.5 ECOG performance status: Grade 1 Perineural invasion (PNI): Absent Prognostic indicators: 1.3 cm with 1/23 nodes positive -2.3 cm with ENE and focal LVI Stage used in treatment planning: Yes National guidelines used in treatment planning: Yes Type of national guideline used in treatment planning: NCCN   01/06/2022 - 03/16/2022 Chemotherapy   Patient is on Treatment Plan : HEAD/NECK Carboplatin  + Paclitaxel  + XRT q7d     01/19/2022 Imaging   CT neck:  IMPRESSION:  1. 26 x 11 x 11 mm soft tissue mass posterior to the left  submandibular gland and anterior to the SCM. Some residual soft  tissue is noted in the PET scan. While this may represent  postoperative scar tissue, it is concerning for residual or  recurrent tumor.  2. Asymmetric soft tissue at the left glossal tonsillar sulcus with  focal surgical clips. No definite mass lesion is present.  3. Left carotid stent is in place. Vessel appears patent.  4. Atherosclerotic calcifications at the right carotid bifurcation  with what appears to be a high-grade proximal right ICA stenosis.  5. Right IJ Port-A-Cath in place.      INTERVAL HISTORY:  Ian Case is here today for repeat clinical assessment for hiistory of stage IVB oropharyngeal cancer diagnosed in August, 2023. Patient states that he feels well but complains of back pain and left facial swelling/pain. He informed me that he had back surgery back at the end of May and he states that he is healing fine besides having back pain especially when walking. He recently started smoking again last month about half a pack a day. I encouraged him and reminded him of the importance of quitting smoking, especially since he has survived a very advanced cancer. He had a CT neck done on 11/17/2023 which revealed stable post treatment appearance of the  oropharynx and neck and no evidence of recurrent disease. He will meet with an ENT at the end of the month. He had labs done on 11/17/2023 and had a low WBC of 3.9 with an ANC of 2300, hemoglobin of 13.4, and platelet count of 216,000. His CMP was completely normal also. I will see him back in 3 months with CBC, CMP, and we will repeat a CT scan in 6 months. He denies fever, chills, night sweats, or other signs of infection. He denies cardiorespiratory and gastrointestinal issues. He  denies pain. His appetite is good and His weight has decreased 15 pounds over last 3 months.    REVIEW OF SYSTEMS:  Review of Systems  Constitutional:  Negative for appetite change, chills, diaphoresis, fatigue, fever and unexpected weight change.  HENT:   Positive for trouble swallowing. Negative for hearing loss, lump/mass, mouth sores, nosebleeds, sore throat, tinnitus and voice change.        Persistent left facial swelling and pain, trouble chewing solids Left lower tooth is painful  Eyes: Negative.   Respiratory: Negative.  Negative for chest tightness, cough, hemoptysis, shortness of breath and wheezing.   Cardiovascular: Negative.  Negative for chest pain, leg swelling and palpitations.  Gastrointestinal: Negative.  Negative for  abdominal distention, abdominal pain, blood in stool, constipation, diarrhea, nausea and vomiting.  Endocrine: Negative.  Negative for hot flashes.  Genitourinary: Negative.  Negative for difficulty urinating, dysuria, frequency and hematuria.   Musculoskeletal:  Positive for back pain (lower, 5/10). Negative for arthralgias, flank pain, gait problem and myalgias.       Left sided facial pain  Skin: Negative.  Negative for itching, rash and wound.  Neurological: Negative.  Negative for dizziness, extremity weakness, gait problem, headaches, light-headedness, numbness, seizures and speech difficulty.  Hematological: Negative.  Negative for adenopathy. Does not bruise/bleed easily.   Psychiatric/Behavioral: Negative.  Negative for depression and sleep disturbance. The patient is not nervous/anxious.     VITALS:  Blood pressure (!) 150/85, pulse 98, temperature 98.2 F (36.8 C), temperature source Oral, resp. rate 16, height 5' 9 (1.753 m), weight 263 lb 8 oz (119.5 kg), SpO2 100%.  Wt Readings from Last 3 Encounters:  11/23/23 263 lb 8 oz (119.5 kg)  08/23/23 278 lb 4.8 oz (126.2 kg)  05/24/23 272 lb 4.8 oz (123.5 kg)    Body mass index is 38.91 kg/m.  Performance status (ECOG): 1 - Symptomatic but completely ambulatory  PHYSICAL EXAM:  Physical Exam Vitals and nursing note reviewed.  Constitutional:      General: He is not in acute distress.    Appearance: Normal appearance. He is normal weight. He is not ill-appearing, toxic-appearing or diaphoretic.  HENT:     Head: Normocephalic and atraumatic.     Right Ear: Tympanic membrane, ear canal and external ear normal. There is no impacted cerumen.     Left Ear: Tympanic membrane, ear canal and external ear normal. There is no impacted cerumen.     Nose: Nose normal. No congestion or rhinorrhea.     Mouth/Throat:     Mouth: Mucous membranes are moist.     Pharynx: Oropharynx is clear. No oropharyngeal exudate or posterior oropharyngeal erythema.  Eyes:     General: No scleral icterus.    Extraocular Movements: Extraocular movements intact.     Conjunctiva/sclera: Conjunctivae normal.     Pupils: Pupils are equal, round, and reactive to light.  Neck:     Comments: Swelling of the left face and a little bit of edema in the anterior neck and post radiation changes.  Cardiovascular:     Rate and Rhythm: Normal rate and regular rhythm.     Heart sounds: Normal heart sounds. No murmur heard.    No friction rub. No gallop.  Pulmonary:     Effort: Pulmonary effort is normal.     Breath sounds: Normal breath sounds. No stridor. No wheezing, rhonchi or rales.  Chest:     Chest wall: No tenderness.  Abdominal:      General: Bowel sounds are normal. There is no distension.     Palpations: Abdomen is soft. There is no hepatomegaly, splenomegaly or mass.     Tenderness: There is no abdominal tenderness. There is no right CVA tenderness, left CVA tenderness, guarding or rebound.     Hernia: No hernia is present.  Musculoskeletal:        General: Normal range of motion.     Cervical back: Normal range of motion and neck supple. No tenderness.     Right lower leg: Edema present.     Left lower leg: Edema present.     Comments: Mild   Lymphadenopathy:     Cervical: No cervical adenopathy.     Upper Body:  Right upper body: No supraclavicular or axillary adenopathy.     Left upper body: No supraclavicular or axillary adenopathy.     Lower Body: No right inguinal adenopathy. No left inguinal adenopathy.  Skin:    General: Skin is warm and dry.     Coloration: Skin is not jaundiced or pale.     Findings: No bruising, erythema, lesion or rash.  Neurological:     General: No focal deficit present.     Mental Status: He is alert and oriented to person, place, and time. Mental status is at baseline.     Cranial Nerves: No cranial nerve deficit.     Sensory: No sensory deficit.     Motor: No weakness.     Coordination: Coordination normal.     Gait: Gait normal.     Deep Tendon Reflexes: Reflexes normal.  Psychiatric:        Mood and Affect: Mood normal.        Behavior: Behavior normal.        Thought Content: Thought content normal.        Judgment: Judgment normal.     LABS:      Latest Ref Rng & Units 11/17/2023   11:57 AM 08/23/2023    2:05 PM 05/24/2023    2:49 PM  CBC  WBC 4.0 - 10.5 K/uL 3.9  3.4  3.7   Hemoglobin 13.0 - 17.0 g/dL 86.5  85.8  85.4   Hematocrit 39.0 - 52.0 % 40.7  39.0  39.9   Platelets 150 - 400 K/uL 216  203  191       Latest Ref Rng & Units 11/17/2023   11:57 AM 08/23/2023    2:05 PM 05/24/2023    2:49 PM  CMP  Glucose 70 - 99 mg/dL 96  98  92   BUN 6 - 20  mg/dL 13  11  19    Creatinine 0.61 - 1.24 mg/dL 9.26  9.31  9.13   Sodium 135 - 145 mmol/L 135  139  137   Potassium 3.5 - 5.1 mmol/L 3.6  3.5  3.8   Chloride 98 - 111 mmol/L 99  100  99   CO2 22 - 32 mmol/L 22  24  23    Calcium 8.9 - 10.3 mg/dL 89.9  89.8  9.9   Total Protein 6.5 - 8.1 g/dL 7.6  7.3  7.7   Total Bilirubin 0.0 - 1.2 mg/dL 0.3  0.2  0.5   Alkaline Phos 38 - 126 U/L 82  79  74   AST 15 - 41 U/L 26  30  41   ALT 0 - 44 U/L 18  24  29     Lab Results  Component Value Date   TIBC 354 05/18/2022   IRONPCTSAT 31 05/18/2022   Lab Results  Component Value Date   VITAMINB12 406 05/24/2023   Lab Results  Component Value Date   FOLATE >40.0 05/24/2023   STUDIES:  EXAM: 04/08/2023 CT NECK WITH CONTRAST IMPRESSION: Stable post treatment neck.  No evidence of recurrent disease.     HISTORY:   Past Medical History:  Diagnosis Date   Arthritis    Back pain    GERD (gastroesophageal reflux disease)    History of kidney stones    Hypertension    Sleep apnea    does not have cpap yet    Past Surgical History:  Procedure Laterality Date   APPENDECTOMY     HAND SURGERY  LUMBAR LAMINECTOMY/DECOMPRESSION MICRODISCECTOMY N/A 12/28/2017   Procedure: Laminectomy and Foraminotomy Lumbar two-three-Lumbar three-four - Lumbar four-five;  Surgeon: Onetha Kuba, MD;  Location[facility] OR;  Service: Neurosurgery;  Laterality: N/A;   OTHER SURGICAL HISTORY     removal on cancer from neck    Family History  Problem Relation Age of Onset   Thyroid disease Mother    Heart disease Father    Prostate cancer Brother    Healthy Daughter    Healthy Son     Social History:  reports that he quit smoking about 11 months ago. His smoking use included cigarettes. He has a 22 pack-year smoking history. He has never used smokeless tobacco. He reports that he does not currently use alcohol. He reports that he does not use drugs.The patient is alone today.  Allergies: No Known  Allergies  Current Medications: Current Outpatient Medications  Medication Sig Dispense Refill   amoxicillin  (AMOXIL ) 500 MG capsule Take 500 mg by mouth 2 (two) times daily.     ibuprofen (ADVIL) 800 MG tablet Take 800 mg by mouth 3 (three) times daily.     XTAMPZA  ER 27 MG C12A Take 1 capsule every 12 hours by oral route for 25 days.     acetaminophen  (TYLENOL ) 325 MG tablet Take 650 mg by mouth 3 (three) times daily.     amitriptyline (ELAVIL) 75 MG tablet Take 1 tablet by mouth daily.     amLODipine (NORVASC) 10 MG tablet Take 1 tablet by mouth every morning.     aspirin EC 81 MG tablet Take by mouth.     atorvastatin (LIPITOR) 80 MG tablet Take 80 mg by mouth daily.     clopidogrel (PLAVIX) 75 MG tablet Take by mouth.     cyanocobalamin  (VITAMIN B12) 1000 MCG/ML injection Inject 1 mL every month by subcutaneous route for 1 day.     docusate sodium  (COLACE) 50 MG capsule Take by mouth.     fenofibrate 54 MG tablet TAKE ONE TABLET BY MOUTH EVERY DAY FOR 30 DAYS     gabapentin (NEURONTIN) 800 MG tablet Take 800 mg by mouth 4 (four) times daily.     HYDROcodone -acetaminophen  (NORCO) 10-325 MG tablet Take 1 tablet by mouth as directed.     meloxicam  (MOBIC ) 15 MG tablet Take 1 tablet (15 mg total) by mouth daily. 30 tablet 5   nitroGLYCERIN (NITROSTAT) 0.4 MG SL tablet PLACE 1 TABLET UNDER THE TONGUE EVERY 5 MIN FOR CHEST PAIN AS NEEDED. NO MORE THAN 3 TABS IN 15 MIN.     pantoprazole  (PROTONIX ) 40 MG tablet Take 1 tablet by mouth daily.     pentoxifylline (TRENTAL) 400 MG CR tablet take one tablet BY MOUTH IN THE MORNING, AND take one tablet AT NOON, AND take one tablet IN THE EVENING. take with meals.     phentermine (ADIPEX-P) 37.5 MG tablet Take 37.5 mg by mouth daily. (Patient not taking: Reported on 03/22/2023)     prochlorperazine  (COMPAZINE ) 10 MG tablet Take 10 mg by mouth every 6 (six) hours as needed.     senna-docusate (SENOKOT-S) 8.6-50 MG tablet Take by mouth.     tiZANidine   (ZANAFLEX ) 4 MG tablet Take by mouth. (Patient not taking: Reported on 03/22/2023)     triamterene -hydrochlorothiazide  (MAXZIDE -25) 37.5-25 MG tablet Take 1 tablet by mouth daily.     No current facility-administered medications for this visit.     I,Jasmine M Lassiter,acting as a scribe for Wanda VEAR Cornish, MD.,have documented all  relevant documentation on the behalf of Wanda VEAR Cornish, MD,as directed by  Wanda VEAR Cornish, MD while in the presence of Wanda VEAR Cornish, MD.

## 2023-11-23 ENCOUNTER — Inpatient Hospital Stay (HOSPITAL_BASED_OUTPATIENT_CLINIC_OR_DEPARTMENT_OTHER): Admitting: Oncology

## 2023-11-23 ENCOUNTER — Other Ambulatory Visit: Payer: Self-pay | Admitting: Oncology

## 2023-11-23 VITALS — BP 150/85 | HR 98 | Temp 98.2°F | Resp 16 | Ht 69.0 in | Wt 263.5 lb

## 2023-11-23 DIAGNOSIS — C109 Malignant neoplasm of oropharynx, unspecified: Secondary | ICD-10-CM

## 2023-11-23 DIAGNOSIS — Z720 Tobacco use: Secondary | ICD-10-CM | POA: Diagnosis not present

## 2023-11-29 ENCOUNTER — Telehealth: Payer: Self-pay | Admitting: Dietician

## 2023-11-29 NOTE — Telephone Encounter (Signed)
 Patient screened on MST. First attempt to reach.   Patient states he is trying to lose weight, and when I asked if he was making healthy choices and being attentive to protein sources to preserve LBM he said not really. He also admits to skipping meals.  I suggested he attend the virtual nutrition class or make appointment for a remote follow up.  Web designer for [employer] Nutrition class with my contact information to set up a nutrition consult.  Micheline Craven, RDN, LDN Registered Dietitian, [facility] Part Time Remote (Usual office hours: Tuesday-Thursday) Cell: [phone]

## 2023-12-15 ENCOUNTER — Encounter: Payer: Self-pay | Admitting: Oncology

## 2023-12-15 DIAGNOSIS — Z720 Tobacco use: Secondary | ICD-10-CM | POA: Insufficient documentation

## 2024-02-23 ENCOUNTER — Inpatient Hospital Stay: Admitting: Oncology

## 2024-02-23 ENCOUNTER — Inpatient Hospital Stay

## 2024-02-23 NOTE — Progress Notes (Incomplete)
 [facility]  [address] 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 He was treated with radical surgical resection. P16 was negative. He had several concerning features on his pathology, including 1 positive node with extranodal extension and focal lymphovascular invasion. He was healing well, and then developed left facial pain and swelling, this occurred prior to initiating any chemoradiation. CT scan was nonspecific. MRI scan did not show any mass affect and was consistent with inflammation of the area. He completed the full course of radiation along with 7 cycles of chemotherapy with carboplatin  and paclitaxel  in  December 2023. He has had persistent left facial pain and swelling. This is felt to be postsurgical, just more pronounced than usually seen. CT neck done on 11/17/2023 revealed stable post treatment appearance of the oropharynx and neck and no evidence of recurrent disease.   Painful left facial swelling  This still persists but the pain has slowly improved over the last year.   Chronic Back Pain Spinal stenosis with 4 back surgeries including vertebral fusion. He has chronic pain and neuropathy, and is followed by [facility]. He had surgery in May and has had definite improvement.     Mild leukopenia I feel this is residual effect from his prior chemoradiation. B-12 and folate levels were normal. I will monitor this. He does not have neutropenia  Plan:   He informed me that he had back surgery back at the end of May and he states that he is healing fine besides having back pain especially when walking. He recently started smoking again last month about half a pack a day. I encouraged him and reminded him of the importance of quitting smoking, especially  since he has survived a very advanced cancer. He had a CT neck done on 11/17/2023 which revealed stable post treatment appearance of the oropharynx and neck and no evidence of recurrent disease. He will meet with an ENT at the end of the month. He had labs done on 11/17/2023 and had a low WBC of 3.9 with an ANC of 2300, hemoglobin of 13.4, and platelet count of 216,000. His CMP was completely normal also. I will see him back in 3 months with CBC, CMP, and we will repeat a CT scan in 6 months.   The patient understands the plans discussed today and is in agreement with them.  He knows to contact our office if he develops concerns prior to his next appointment.  I provided *** minutes of face-to-face time during this encounter and > 50% was spent counseling as documented under my assessment and plan.   Wanda VEAR Cornish, MD  [facility] DEPT OF [facility] [address] [place] [address] Dept: [phone] Dept Fax: [phone]   No orders of the defined types were placed in this encounter.   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  Current Treatment:  Surveillance  HISTORY OF PRESENT ILLNESS:   Oncology History  Oropharyngeal carcinoma (HCC)  11/06/2021 Initial Diagnosis   Oropharyngeal carcinoma (HCC)   11/06/2021 Cancer Staging   Staging form: Pharynx - P16 Negative Oropharynx, AJCC 8th Edition - Clinical stage from 11/06/2021: Stage IVB (cT1, cN3, cM0, p16-) - Signed by Cornish Wanda VEAR, MD on  12/28/2021 Histopathologic type: Squamous cell carcinoma, NOS Stage prefix: Initial diagnosis Histologic grade (G): G2 Histologic grading system: 4 grade system Tumor size (mm): 13 Lymph-vascular invasion (LVI): LVI present/identified, NOS Diagnostic confirmation: Positive histology Specimen type: Excision Staged by: Managing physician Presence of extranodal extension: Present Extent of extranodal  extension: Microscopic ENE(+) Distance of extension from the native lymph node capsule to the farthest point of invasion in the extranodal tissue (mm): 6.5 ECOG performance status: Grade 1 Perineural invasion (PNI): Absent Prognostic indicators: 1.3 cm with 1/23 nodes positive -2.3 cm with ENE and focal LVI Stage used in treatment planning: Yes National guidelines used in treatment planning: Yes Type of national guideline used in treatment planning: NCCN   01/06/2022 - 03/16/2022 Chemotherapy   Patient is on Treatment Plan : HEAD/NECK Carboplatin  + Paclitaxel  + XRT q7d     01/19/2022 Imaging   CT neck:  IMPRESSION:  1. 26 x 11 x 11 mm soft tissue mass posterior to the left  submandibular gland and anterior to the SCM. Some residual soft  tissue is noted in the PET scan. While this may represent  postoperative scar tissue, it is concerning for residual or  recurrent tumor.  2. Asymmetric soft tissue at the left glossal tonsillar sulcus with  focal surgical clips. No definite mass lesion is present.  3. Left carotid stent is in place. Vessel appears patent.  4. Atherosclerotic calcifications at the right carotid bifurcation  with what appears to be a high-grade proximal right ICA stenosis.  5. Right IJ Port-A-Cath in place.      INTERVAL HISTORY:  Ian Case is here today for repeat clinical assessment for hiistory of stage IVB oropharyngeal cancer diagnosed in August, 2023. Patient states that he feels *** and ***.         He has a WBC of ***, hemoglobin of ***, and platelet count of ***. His CMP ***.      I will see him back in *** with ***.    He denies fever, chills, night sweats, or other signs of infection. He denies cardiorespiratory and gastrointestinal issues. He  denies pain. His appetite is *** and His weight {Weight change:10426}.  Wt Readings from Last 3 Encounters:  11/23/23 263 lb 8 oz (119.5 kg)  08/23/23 278 lb 4.8 oz (126.2 kg)  05/24/23 272 lb 4.8 oz  (123.5 kg)   Patient states that he feels well but complains of back pain and left facial swelling/pain. He informed me that he had back surgery back at the end of May and he states that he is healing fine besides having back pain especially when walking. He recently started smoking again last month about half a pack a day. I encouraged him and reminded him of the importance of quitting smoking, especially since he has survived a very advanced cancer. He had a CT neck done on 11/17/2023 which revealed stable post treatment appearance of the oropharynx and neck and no evidence of recurrent disease. He will meet with an ENT at the end of the month. He had labs done on 11/17/2023 and had a low WBC of 3.9 with an ANC of 2300, hemoglobin of 13.4, and platelet count of 216,000. His CMP was completely normal also. I will see him back in 3 months with CBC, CMP, and we will repeat a CT scan in 6 months. He denies fever, chills, night sweats, or other signs of infection. He denies cardiorespiratory and gastrointestinal issues. He  denies pain. His appetite is good  and His weight has decreased 15 pounds over last 3 months.    REVIEW OF SYSTEMS:  Review of Systems  Constitutional:  Negative for appetite change, chills, diaphoresis, fatigue, fever and unexpected weight change.  HENT:   Positive for trouble swallowing. Negative for hearing loss, lump/mass, mouth sores, nosebleeds, sore throat, tinnitus and voice change.        Persistent left facial swelling and pain, trouble chewing solids Left lower tooth is painful  Eyes: Negative.   Respiratory: Negative.  Negative for chest tightness, cough, hemoptysis, shortness of breath and wheezing.   Cardiovascular: Negative.  Negative for chest pain, leg swelling and palpitations.  Gastrointestinal: Negative.  Negative for abdominal distention, abdominal pain, blood in stool, constipation, diarrhea, nausea and vomiting.  Endocrine: Negative.  Negative for hot flashes.   Genitourinary: Negative.  Negative for difficulty urinating, dysuria, frequency and hematuria.   Musculoskeletal:  Positive for back pain (lower, 5/10). Negative for arthralgias, flank pain, gait problem and myalgias.       Left sided facial pain  Skin: Negative.  Negative for itching, rash and wound.  Neurological: Negative.  Negative for dizziness, extremity weakness, gait problem, headaches, light-headedness, numbness, seizures and speech difficulty.  Hematological: Negative.  Negative for adenopathy. Does not bruise/bleed easily.  Psychiatric/Behavioral: Negative.  Negative for depression and sleep disturbance. The patient is not nervous/anxious.     VITALS:  There were no vitals taken for this visit.  Wt Readings from Last 3 Encounters:  11/23/23 263 lb 8 oz (119.5 kg)  08/23/23 278 lb 4.8 oz (126.2 kg)  05/24/23 272 lb 4.8 oz (123.5 kg)    There is no height or weight on file to calculate BMI.  Performance status (ECOG): 1 - Symptomatic but completely ambulatory  PHYSICAL EXAM:  Physical Exam Vitals and nursing note reviewed.  Constitutional:      General: He is not in acute distress.    Appearance: Normal appearance. He is normal weight. He is not ill-appearing, toxic-appearing or diaphoretic.  HENT:     Head: Normocephalic and atraumatic.     Right Ear: Tympanic membrane, ear canal and external ear normal. There is no impacted cerumen.     Left Ear: Tympanic membrane, ear canal and external ear normal. There is no impacted cerumen.     Nose: Nose normal. No congestion or rhinorrhea.     Mouth/Throat:     Mouth: Mucous membranes are moist.     Pharynx: Oropharynx is clear. No oropharyngeal exudate or posterior oropharyngeal erythema.  Eyes:     General: No scleral icterus.    Extraocular Movements: Extraocular movements intact.     Conjunctiva/sclera: Conjunctivae normal.     Pupils: Pupils are equal, round, and reactive to light.  Neck:     Comments: Swelling of the  left face and a little bit of edema in the anterior neck and post radiation changes.  Cardiovascular:     Rate and Rhythm: Normal rate and regular rhythm.     Heart sounds: Normal heart sounds. No murmur heard.    No friction rub. No gallop.  Pulmonary:     Effort: Pulmonary effort is normal.     Breath sounds: Normal breath sounds. No stridor. No wheezing, rhonchi or rales.  Chest:     Chest wall: No tenderness.  Abdominal:     General: Bowel sounds are normal. There is no distension.     Palpations: Abdomen is soft. There is no hepatomegaly, splenomegaly or mass.  Tenderness: There is no abdominal tenderness. There is no right CVA tenderness, left CVA tenderness, guarding or rebound.     Hernia: No hernia is present.  Musculoskeletal:        General: Normal range of motion.     Cervical back: Normal range of motion and neck supple. No tenderness.     Right lower leg: Edema present.     Left lower leg: Edema present.     Comments: Mild   Lymphadenopathy:     Cervical: No cervical adenopathy.     Upper Body:     Right upper body: No supraclavicular or axillary adenopathy.     Left upper body: No supraclavicular or axillary adenopathy.     Lower Body: No right inguinal adenopathy. No left inguinal adenopathy.  Skin:    General: Skin is warm and dry.     Coloration: Skin is not jaundiced or pale.     Findings: No bruising, erythema, lesion or rash.  Neurological:     General: No focal deficit present.     Mental Status: He is alert and oriented to person, place, and time. Mental status is at baseline.     Cranial Nerves: No cranial nerve deficit.     Sensory: No sensory deficit.     Motor: No weakness.     Coordination: Coordination normal.     Gait: Gait normal.     Deep Tendon Reflexes: Reflexes normal.  Psychiatric:        Mood and Affect: Mood normal.        Behavior: Behavior normal.        Thought Content: Thought content normal.        Judgment: Judgment normal.      LABS:      Latest Ref Rng & Units 11/17/2023   11:57 AM 08/23/2023    2:05 PM 05/24/2023    2:49 PM  CBC  WBC 4.0 - 10.5 K/uL 3.9  3.4  3.7   Hemoglobin 13.0 - 17.0 g/dL 86.5  85.8  85.4   Hematocrit 39.0 - 52.0 % 40.7  39.0  39.9   Platelets 150 - 400 K/uL 216  203  191       Latest Ref Rng & Units 11/17/2023   11:57 AM 08/23/2023    2:05 PM 05/24/2023    2:49 PM  CMP  Glucose 70 - 99 mg/dL 96  98  92   BUN 6 - 20 mg/dL 13  11  19    Creatinine 0.61 - 1.24 mg/dL 9.26  9.31  9.13   Sodium 135 - 145 mmol/L 135  139  137   Potassium 3.5 - 5.1 mmol/L 3.6  3.5  3.8   Chloride 98 - 111 mmol/L 99  100  99   CO2 22 - 32 mmol/L 22  24  23    Calcium 8.9 - 10.3 mg/dL 89.9  89.8  9.9   Total Protein 6.5 - 8.1 g/dL 7.6  7.3  7.7   Total Bilirubin 0.0 - 1.2 mg/dL 0.3  0.2  0.5   Alkaline Phos 38 - 126 U/L 82  79  74   AST 15 - 41 U/L 26  30  41   ALT 0 - 44 U/L 18  24  29     Lab Results  Component Value Date   TIBC 354 05/18/2022   IRONPCTSAT 31 05/18/2022   Lab Results  Component Value Date   VITAMINB12 406 05/24/2023   Lab Results  Component Value Date   FOLATE >40.0 05/24/2023   STUDIES:  EXAM: 11/17/2023 CT NECK WITH CONTRAST IMPRESSION: Stable post treatment appearance of the oropharynx and neck. No evidence of recurrent disease.  EXAM: 04/08/2023 CT NECK WITH CONTRAST IMPRESSION: Stable post treatment neck.  No evidence of recurrent disease.     HISTORY:   Past Medical History:  Diagnosis Date   Arthritis    Back pain    GERD (gastroesophageal reflux disease)    History of kidney stones    Hypertension    Sleep apnea    does not have cpap yet    Past Surgical History:  Procedure Laterality Date   APPENDECTOMY     HAND SURGERY     LUMBAR LAMINECTOMY/DECOMPRESSION MICRODISCECTOMY N/A 12/28/2017   Procedure: Laminectomy and Foraminotomy Lumbar two-three-Lumbar three-four - Lumbar four-five;  Surgeon: Onetha Kuba, MD;  Location: [facility] OR;  Service:  Neurosurgery;  Laterality: N/A;   OTHER SURGICAL HISTORY     removal on cancer from neck    Family History  Problem Relation Age of Onset   Thyroid disease Mother    Heart disease Father    Prostate cancer Brother    Healthy Daughter    Healthy Son     Social History:  reports that he quit smoking about 13 months ago. His smoking use included cigarettes. He has a 22 pack-year smoking history. He has never used smokeless tobacco. He reports that he does not currently use alcohol. He reports that he does not use drugs.The patient is alone today.  Allergies: No Known Allergies  Current Medications: Current Outpatient Medications  Medication Sig Dispense Refill   acetaminophen  (TYLENOL ) 325 MG tablet Take 650 mg by mouth 3 (three) times daily.     amitriptyline (ELAVIL) 75 MG tablet Take 1 tablet by mouth daily.     amLODipine (NORVASC) 10 MG tablet Take 1 tablet by mouth every morning.     amoxicillin  (AMOXIL ) 500 MG capsule Take 500 mg by mouth 2 (two) times daily.     aspirin EC 81 MG tablet Take by mouth.     atorvastatin (LIPITOR) 80 MG tablet Take 80 mg by mouth daily.     clopidogrel (PLAVIX) 75 MG tablet Take by mouth.     cyanocobalamin  (VITAMIN B12) 1000 MCG/ML injection Inject 1 mL every month by subcutaneous route for 1 day.     docusate sodium  (COLACE) 50 MG capsule Take by mouth.     fenofibrate 54 MG tablet TAKE ONE TABLET BY MOUTH EVERY DAY FOR 30 DAYS     gabapentin (NEURONTIN) 800 MG tablet Take 800 mg by mouth 4 (four) times daily.     HYDROcodone -acetaminophen  (NORCO) 10-325 MG tablet Take 1 tablet by mouth as directed.     ibuprofen (ADVIL) 800 MG tablet Take 800 mg by mouth 3 (three) times daily.     meloxicam  (MOBIC ) 15 MG tablet Take 1 tablet (15 mg total) by mouth daily. 30 tablet 5   nitroGLYCERIN (NITROSTAT) 0.4 MG SL tablet PLACE 1 TABLET UNDER THE TONGUE EVERY 5 MIN FOR CHEST PAIN AS NEEDED. NO MORE THAN 3 TABS IN 15 MIN.     pantoprazole  (PROTONIX ) 40 MG  tablet Take 1 tablet by mouth daily.     pentoxifylline (TRENTAL) 400 MG CR tablet take one tablet BY MOUTH IN THE MORNING, AND take one tablet AT NOON, AND take one tablet IN THE EVENING. take with meals.     phentermine (ADIPEX-P) 37.5 MG tablet  Take 37.5 mg by mouth daily. (Patient not taking: Reported on 03/22/2023)     prochlorperazine  (COMPAZINE ) 10 MG tablet Take 10 mg by mouth every 6 (six) hours as needed.     senna-docusate (SENOKOT-S) 8.6-50 MG tablet Take by mouth.     tiZANidine  (ZANAFLEX ) 4 MG tablet Take by mouth. (Patient not taking: Reported on 03/22/2023)     triamterene -hydrochlorothiazide  (MAXZIDE -25) 37.5-25 MG tablet Take 1 tablet by mouth daily.     XTAMPZA  ER 27 MG C12A Take 1 capsule every 12 hours by oral route for 25 days.     No current facility-administered medications for this visit.   I,Yazmyn M Lowe,acting as a neurosurgeon for Wanda VEAR Cornish, MD.,have documented all relevant documentation on the behalf of Wanda VEAR Cornish, MD,as directed by  Wanda VEAR Cornish, MD while in the presence of Wanda VEAR Cornish, MD.  I have reviewed this report as typed by the medical scribe, and it is complete and accurate.

## 2024-03-07 ENCOUNTER — Inpatient Hospital Stay

## 2024-03-07 ENCOUNTER — Inpatient Hospital Stay: Admitting: Oncology

## 2024-03-08 ENCOUNTER — Inpatient Hospital Stay: Admitting: Oncology

## 2024-03-08 ENCOUNTER — Other Ambulatory Visit: Payer: Self-pay | Admitting: Oncology

## 2024-03-08 ENCOUNTER — Inpatient Hospital Stay: Attending: Hematology and Oncology

## 2024-03-08 VITALS — BP 156/85 | HR 93 | Temp 98.1°F | Resp 16 | Ht 69.0 in | Wt 250.9 lb

## 2024-03-08 DIAGNOSIS — C109 Malignant neoplasm of oropharynx, unspecified: Secondary | ICD-10-CM

## 2024-03-08 DIAGNOSIS — R609 Edema, unspecified: Secondary | ICD-10-CM | POA: Insufficient documentation

## 2024-03-08 DIAGNOSIS — G8929 Other chronic pain: Secondary | ICD-10-CM | POA: Diagnosis not present

## 2024-03-08 DIAGNOSIS — M549 Dorsalgia, unspecified: Secondary | ICD-10-CM | POA: Diagnosis not present

## 2024-03-08 DIAGNOSIS — D72819 Decreased white blood cell count, unspecified: Secondary | ICD-10-CM | POA: Diagnosis not present

## 2024-03-08 DIAGNOSIS — Z8042 Family history of malignant neoplasm of prostate: Secondary | ICD-10-CM | POA: Diagnosis not present

## 2024-03-08 DIAGNOSIS — Z87891 Personal history of nicotine dependence: Secondary | ICD-10-CM | POA: Insufficient documentation

## 2024-03-08 DIAGNOSIS — Z79899 Other long term (current) drug therapy: Secondary | ICD-10-CM | POA: Diagnosis not present

## 2024-03-08 LAB — CBC WITH DIFFERENTIAL (CANCER CENTER ONLY)
Abs Immature Granulocytes: 0.02 K/uL (ref 0.00–0.07)
Basophils Absolute: 0 K/uL (ref 0.0–0.1)
Basophils Relative: 0 %
Eosinophils Absolute: 0 K/uL (ref 0.0–0.5)
Eosinophils Relative: 1 %
HCT: 45.2 % (ref 39.0–52.0)
Hemoglobin: 15.3 g/dL (ref 13.0–17.0)
Immature Granulocytes: 0 %
Lymphocytes Relative: 27 %
Lymphs Abs: 1.3 K/uL (ref 0.7–4.0)
MCH: 31.4 pg (ref 26.0–34.0)
MCHC: 33.8 g/dL (ref 30.0–36.0)
MCV: 92.6 fL (ref 80.0–100.0)
Monocytes Absolute: 0.5 K/uL (ref 0.1–1.0)
Monocytes Relative: 11 %
Neutro Abs: 3.1 K/uL (ref 1.7–7.7)
Neutrophils Relative %: 61 %
Platelet Count: 236 K/uL (ref 150–400)
RBC: 4.88 MIL/uL (ref 4.22–5.81)
RDW: 14.6 % (ref 11.5–15.5)
WBC Count: 5.1 K/uL (ref 4.0–10.5)
nRBC: 0 % (ref 0.0–0.2)

## 2024-03-08 LAB — CMP (CANCER CENTER ONLY)
ALT: 19 U/L (ref 0–44)
AST: 29 U/L (ref 15–41)
Albumin: 4.2 g/dL (ref 3.5–5.0)
Alkaline Phosphatase: 67 U/L (ref 38–126)
Anion gap: 16 — ABNORMAL HIGH (ref 5–15)
BUN: 18 mg/dL (ref 8–23)
CO2: 22 mmol/L (ref 22–32)
Calcium: 10 mg/dL (ref 8.9–10.3)
Chloride: 97 mmol/L — ABNORMAL LOW (ref 98–111)
Creatinine: 0.78 mg/dL (ref 0.61–1.24)
GFR, Estimated: >60 mL/min (ref >60)
Glucose, Bld: 90 mg/dL (ref 70–99)
Potassium: 3.3 mmol/L — ABNORMAL LOW (ref 3.5–5.1)
Sodium: 134 mmol/L — ABNORMAL LOW (ref 135–145)
Total Bilirubin: 0.3 mg/dL (ref 0.0–1.2)
Total Protein: 7.7 g/dL (ref 6.5–8.1)

## 2024-03-08 NOTE — Progress Notes (Signed)
 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 He was treated with radical surgical resection. P16 was negative. He had several concerning features on his pathology, including 1 positive node with extranodal extension and focal lymphovascular invasion. He was healing well, and then developed left facial pain and swelling, this occurred prior to initiating any chemoradiation. CT scan was nonspecific. MRI scan did not show any mass affect and was consistent with inflammation of the area. He completed the full course of radiation along with 7 cycles of chemotherapy with carboplatin  and paclitaxel  in  December 2023. He has had persistent left facial pain and swelling. This is felt to be postsurgical, just more pronounced than usually seen. CT neck done on 11/17/2023 revealed stable post treatment appearance of the oropharynx and neck and no evidence of recurrent disease.   Painful left facial swelling  This still persists but the pain has slowly improved over the last few years.   Chronic Back Pain Spinal stenosis with 4 back surgeries including vertebral fusion. He has chronic pain and neuropathy, and is followed by [facility]. He had surgery in May 2025 and had definite improvement, but now feels the same with pain at a 4/10.     Mild leukopenia I feel this is residual effect from his prior chemoradiation. B-12 and folate levels were normal. I will monitor this. He does not have neutropenia. This has resolved today, but we will continue to monitor.  Plan: He states that he feels okay, but complains of back pain rated 4/10. He reports that he had complete dental extraction performed and will receive dentures next month. He has a WBC of 5.1, hemoglobin of  15.3, and platelet count of 236,000. His CMP is normal other than a slightly low sodium of 134 down from 135 and a low potassium of 3.3 down from 3.6. I encouraged him to increase potassium in his diet and provided a resource listing foods high in potassium. I instructed him take increase his oral potassium supplements to BID. I will schedule a CT soft tissue neck and CT chest scan for 3 months from now and I will see him the following week with CBC and CMP. The patient understands the plans discussed today and is in agreement with them.  He knows to contact our office if he develops concerns prior to his next appointment.  I provided 13 minutes of face-to-face time during this encounter and > 50% was spent counseling as documented under my assessment and plan.   Ian VEAR Cornish, MD  [facility] - A DEPT OF [facility] [address] [place] [address] Dept: [phone] Dept Fax: [phone]   No orders of the defined types were placed in this encounter.   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  Current Treatment:  Surveillance  HISTORY OF PRESENT ILLNESS:   Oncology History  Oropharyngeal carcinoma (HCC)  11/06/2021 Initial Diagnosis   Oropharyngeal carcinoma (HCC)   11/06/2021 Cancer Staging   Staging form: Pharynx - P16 Negative Oropharynx, AJCC 8th Edition - Clinical stage from 11/06/2021: Stage IVB (cT1, cN3, cM0, p16-) - Signed by Case Ian VEAR, MD on 12/28/2021 Histopathologic type: Squamous cell carcinoma, NOS Stage prefix: Initial diagnosis Histologic  grade (G): G2 Histologic grading system: 4 grade system Tumor size (mm): 13 Lymph-vascular invasion (LVI): LVI present/identified, NOS Diagnostic confirmation: Positive histology Specimen type: Excision Staged by: Managing physician Presence of extranodal extension: Present Extent of extranodal extension: Microscopic ENE(+) Distance  of extension from the native lymph node capsule to the farthest point of invasion in the extranodal tissue (mm): 6.5 ECOG performance status: Grade 1 Perineural invasion (PNI): Absent Prognostic indicators: 1.3 cm with 1/23 nodes positive -2.3 cm with ENE and focal LVI Stage used in treatment planning: Yes National guidelines used in treatment planning: Yes Type of national guideline used in treatment planning: NCCN   01/06/2022 - 03/16/2022 Chemotherapy   Patient is on Treatment Plan : HEAD/NECK Carboplatin  + Paclitaxel  + XRT q7d     01/19/2022 Imaging   CT neck:  IMPRESSION:  1. 26 x 11 x 11 mm soft tissue mass posterior to the left  submandibular gland and anterior to the SCM. Some residual soft  tissue is noted in the PET scan. While this may represent  postoperative scar tissue, it is concerning for residual or  recurrent tumor.  2. Asymmetric soft tissue at the left glossal tonsillar sulcus with  focal surgical clips. No definite mass lesion is present.  3. Left carotid stent is in place. Vessel appears patent.  4. Atherosclerotic calcifications at the right carotid bifurcation  with what appears to be a high-grade proximal right ICA stenosis.  5. Right IJ Port-A-Cath in place.      INTERVAL HISTORY:  Ian Case is here today for repeat clinical assessment for history of stage IVB oropharyngeal cancer diagnosed in August, 2023. Patient states that he feels okay, but complains of back pain rated 4/10. He reports that he had complete dental extraction performed and will receive dentures next month. He has a WBC of 5.1, hemoglobin of 15.3, and platelet count of 236,000. His CMP is normal other than a slightly low sodium of 134 down from 135 and a low potassium of 3.3 down from 3.6. I encouraged him to increase potassium in his diet and provided a resource listing foods high in potassium. I instructed him take increase his oral potassium supplements to BID. I will schedule a CT soft tissue  neck and CT chest scan for 3 months from now and I will see him the following week with CBC and CMP.  He denies fever, chills, night sweats, or other signs of infection. He denies cardiorespiratory and gastrointestinal issues. His appetite is good and His weight has decreased 13 pounds over last 4 months.   REVIEW OF SYSTEMS:  Review of Systems  Constitutional:  Negative for appetite change, chills, diaphoresis, fatigue, fever and unexpected weight change.  HENT:   Positive for trouble swallowing (improving). Negative for hearing loss, lump/mass, mouth sores, nosebleeds, sore throat, tinnitus and voice change.        Persistent left facial swelling and pain, trouble chewing solids He has now had complete dental extractions  Eyes: Negative.   Respiratory: Negative.  Negative for chest tightness, cough, hemoptysis, shortness of breath and wheezing.   Cardiovascular: Negative.  Negative for chest pain, leg swelling and palpitations.  Gastrointestinal: Negative.  Negative for abdominal distention, abdominal pain, blood in stool, constipation, diarrhea, nausea and vomiting.  Endocrine: Negative.  Negative for hot flashes.  Genitourinary: Negative.  Negative for difficulty urinating, dysuria, frequency and hematuria.   Musculoskeletal:  Positive for back pain (4/10). Negative for arthralgias, flank pain, gait problem and myalgias.  Left sided facial pain  Skin: Negative.  Negative for itching, rash and wound.  Neurological: Negative.  Negative for dizziness, extremity weakness, gait problem, headaches, light-headedness, numbness, seizures and speech difficulty.  Hematological: Negative.  Negative for adenopathy. Does not bruise/bleed easily.  Psychiatric/Behavioral: Negative.  Negative for depression and sleep disturbance. The patient is not nervous/anxious.     VITALS:  Blood pressure (!) 156/85, pulse 93, temperature 98.1 F (36.7 C), temperature source Oral, resp. rate 16, height 5' 9  (1.753 m), weight 250 lb 14.4 oz (113.8 kg), SpO2 96%.  Wt Readings from Last 3 Encounters:  03/08/24 250 lb 14.4 oz (113.8 kg)  11/23/23 263 lb 8 oz (119.5 kg)  08/23/23 278 lb 4.8 oz (126.2 kg)    Body mass index is 37.05 kg/m.  Performance status (ECOG): 1 - Symptomatic but completely ambulatory  PHYSICAL EXAM:  Physical Exam Vitals and nursing note reviewed.  Constitutional:      General: He is not in acute distress.    Appearance: Normal appearance. He is normal weight. He is not ill-appearing, toxic-appearing or diaphoretic.  HENT:     Head: Normocephalic and atraumatic.     Comments: Mild swelling and erythema of the left face.    Right Ear: Tympanic membrane, ear canal and external ear normal. There is no impacted cerumen.     Left Ear: Tympanic membrane, ear canal and external ear normal. There is no impacted cerumen.     Nose: Nose normal. No congestion or rhinorrhea.     Mouth/Throat:     Mouth: Mucous membranes are moist.     Pharynx: Oropharynx is clear. No oropharyngeal exudate or posterior oropharyngeal erythema.  Eyes:     General: No scleral icterus.    Extraocular Movements: Extraocular movements intact.     Conjunctiva/sclera: Conjunctivae normal.     Pupils: Pupils are equal, round, and reactive to light.  Neck:     Comments: Induration and a large scar in the left anterior neck. Swelling of the left face and a little bit of edema in the anterior neck and post radiation changes.  Cardiovascular:     Rate and Rhythm: Normal rate and regular rhythm.     Heart sounds: Normal heart sounds. No murmur heard.    No friction rub. No gallop.  Pulmonary:     Effort: Pulmonary effort is normal.     Breath sounds: No stridor. Wheezing (largely upper airway) present. No rhonchi or rales.  Chest:     Chest wall: No tenderness.  Abdominal:     General: Bowel sounds are normal. There is no distension.     Palpations: Abdomen is soft. There is no hepatomegaly,  splenomegaly or mass.     Tenderness: There is no abdominal tenderness. There is no right CVA tenderness, left CVA tenderness, guarding or rebound.     Hernia: No hernia is present.  Musculoskeletal:        General: Normal range of motion.     Cervical back: Normal range of motion and neck supple. No tenderness.     Right lower leg: Edema (mild) present.     Left lower leg: Edema (mild) present.  Lymphadenopathy:     Cervical: No cervical adenopathy.     Upper Body:     Right upper body: No supraclavicular or axillary adenopathy.     Left upper body: No supraclavicular or axillary adenopathy.     Lower Body: No right inguinal adenopathy. No left inguinal adenopathy.  Skin:    General: Skin is warm and dry.     Coloration: Skin is not jaundiced or pale.     Findings: No bruising, erythema, lesion or rash.  Neurological:     General: No focal deficit present.     Mental Status: He is alert and oriented to person, place, and time. Mental status is at baseline.     Cranial Nerves: No cranial nerve deficit.     Sensory: No sensory deficit.     Motor: No weakness.     Coordination: Coordination normal.     Gait: Gait normal.     Deep Tendon Reflexes: Reflexes normal.  Psychiatric:        Mood and Affect: Mood normal.        Behavior: Behavior normal.        Thought Content: Thought content normal.        Judgment: Judgment normal.    LABS:      Latest Ref Rng & Units 03/08/2024   11:19 AM 11/17/2023   11:57 AM 08/23/2023    2:05 PM  CBC  WBC 4.0 - 10.5 K/uL 5.1  3.9  3.4   Hemoglobin 13.0 - 17.0 g/dL 84.6  86.5  85.8   Hematocrit 39.0 - 52.0 % 45.2  40.7  39.0   Platelets 150 - 400 K/uL 236  216  203       Latest Ref Rng & Units 03/08/2024   11:19 AM 11/17/2023   11:57 AM 08/23/2023    2:05 PM  CMP  Glucose 70 - 99 mg/dL 90  96  98   BUN 8 - 23 mg/dL 18  13  11    Creatinine 0.61 - 1.24 mg/dL 9.21  9.26  9.31   Sodium 135 - 145 mmol/L 134  135  139   Potassium 3.5 - 5.1  mmol/L 3.3  3.6  3.5   Chloride 98 - 111 mmol/L 97  99  100   CO2 22 - 32 mmol/L 22  22  24    Calcium 8.9 - 10.3 mg/dL 89.9  89.9  89.8   Total Protein 6.5 - 8.1 g/dL 7.7  7.6  7.3   Total Bilirubin 0.0 - 1.2 mg/dL 0.3  0.3  0.2   Alkaline Phos 38 - 126 U/L 67  82  79   AST 15 - 41 U/L 29  26  30    ALT 0 - 44 U/L 19  18  24     Lab Results  Component Value Date   TIBC 354 05/18/2022   IRONPCTSAT 31 05/18/2022   Lab Results  Component Value Date   VITAMINB12 406 05/24/2023   Lab Results  Component Value Date   FOLATE >40.0 05/24/2023   STUDIES:  EXAM: 11/17/2023 CT NECK WITH CONTRAST IMPRESSION: Stable post treatment appearance of the oropharynx and neck. No evidence of recurrent disease.  EXAM: 04/08/2023 CT NECK WITH CONTRAST IMPRESSION: Stable post treatment neck.  No evidence of recurrent disease.     HISTORY:   Past Medical History:  Diagnosis Date   Arthritis    Back pain    GERD (gastroesophageal reflux disease)    History of kidney stones    Hypertension    Sleep apnea    does not have cpap yet    Past Surgical History:  Procedure Laterality Date   APPENDECTOMY     HAND SURGERY     LUMBAR LAMINECTOMY/DECOMPRESSION MICRODISCECTOMY N/A 12/28/2017   Procedure: Laminectomy and Foraminotomy Lumbar  two-three-Lumbar three-four - Lumbar four-five;  Surgeon: Onetha Kuba, MD;  Location: [facility] OR;  Service: Neurosurgery;  Laterality: N/A;   OTHER SURGICAL HISTORY     removal on cancer from neck    Family History  Problem Relation Age of Onset   Thyroid disease Mother    Heart disease Father    Prostate cancer Brother    Healthy Daughter    Healthy Son     Social History:  reports that he quit smoking about 13 months ago. His smoking use included cigarettes. He has a 22 pack-year smoking history. He has never used smokeless tobacco. He reports that he does not currently use alcohol. He reports that he does not use drugs.The patient is alone  today.  Allergies: No Known Allergies  Current Medications: Current Outpatient Medications  Medication Sig Dispense Refill   morphine (MS CONTIN) 15 MG 12 hr tablet Take 1 tablet every 12 hours by oral route for 30 days.     acetaminophen  (TYLENOL ) 325 MG tablet Take 650 mg by mouth 3 (three) times daily.     amitriptyline (ELAVIL) 75 MG tablet Take 1 tablet by mouth daily.     amLODipine (NORVASC) 10 MG tablet Take 1 tablet by mouth every morning.     amoxicillin  (AMOXIL ) 500 MG capsule Take 500 mg by mouth 2 (two) times daily.     aspirin EC 81 MG tablet Take by mouth.     atorvastatin (LIPITOR) 80 MG tablet Take 80 mg by mouth daily.     clopidogrel (PLAVIX) 75 MG tablet Take by mouth.     cyanocobalamin  (VITAMIN B12) 1000 MCG/ML injection Inject 1 mL every month by subcutaneous route for 1 day.     docusate sodium  (COLACE) 50 MG capsule Take by mouth.     fenofibrate 54 MG tablet TAKE ONE TABLET BY MOUTH EVERY DAY FOR 30 DAYS     gabapentin (NEURONTIN) 800 MG tablet Take 800 mg by mouth 4 (four) times daily.     HYDROcodone -acetaminophen  (NORCO) 10-325 MG tablet Take 1 tablet by mouth as directed.     ibuprofen (ADVIL) 800 MG tablet Take 800 mg by mouth 3 (three) times daily.     meloxicam  (MOBIC ) 15 MG tablet Take 1 tablet (15 mg total) by mouth daily. 30 tablet 5   nitroGLYCERIN (NITROSTAT) 0.4 MG SL tablet PLACE 1 TABLET UNDER THE TONGUE EVERY 5 MIN FOR CHEST PAIN AS NEEDED. NO MORE THAN 3 TABS IN 15 MIN.     pantoprazole  (PROTONIX ) 40 MG tablet Take 1 tablet by mouth daily.     pentoxifylline (TRENTAL) 400 MG CR tablet take one tablet BY MOUTH IN THE MORNING, AND take one tablet AT NOON, AND take one tablet IN THE EVENING. take with meals.     phentermine (ADIPEX-P) 37.5 MG tablet Take 37.5 mg by mouth daily. (Patient not taking: Reported on 03/22/2023)     prochlorperazine  (COMPAZINE ) 10 MG tablet Take 10 mg by mouth every 6 (six) hours as needed.     senna-docusate (SENOKOT-S)  8.6-50 MG tablet Take by mouth.     tiZANidine  (ZANAFLEX ) 4 MG tablet Take by mouth. (Patient not taking: Reported on 03/22/2023)     triamterene -hydrochlorothiazide  (MAXZIDE -25) 37.5-25 MG tablet Take 1 tablet by mouth daily.     No current facility-administered medications for this visit.   I,Yazmyn M Lowe,acting as a neurosurgeon for Ian VEAR Cornish, MD.,have documented all relevant documentation on the behalf of Ian VEAR Cornish, MD,as directed  by  Ian VEAR Cornish, MD while in the presence of Ian VEAR Cornish, MD.  I have reviewed this report as typed by the medical scribe, and it is complete and accurate.

## 2024-03-14 ENCOUNTER — Encounter: Payer: Self-pay | Admitting: Oncology

## 2024-03-15 ENCOUNTER — Other Ambulatory Visit: Payer: Self-pay | Admitting: Oncology

## 2024-03-15 DIAGNOSIS — Z720 Tobacco use: Secondary | ICD-10-CM

## 2024-03-15 DIAGNOSIS — C109 Malignant neoplasm of oropharynx, unspecified: Secondary | ICD-10-CM

## 2024-06-06 ENCOUNTER — Inpatient Hospital Stay

## 2024-06-07 ENCOUNTER — Inpatient Hospital Stay (HOSPITAL_BASED_OUTPATIENT_CLINIC_OR_DEPARTMENT_OTHER): Admission: RE | Admit: 2024-06-07 | Source: Ambulatory Visit | Admitting: Radiology

## 2024-06-07 ENCOUNTER — Other Ambulatory Visit (HOSPITAL_BASED_OUTPATIENT_CLINIC_OR_DEPARTMENT_OTHER): Admitting: Radiology

## 2024-06-07 ENCOUNTER — Inpatient Hospital Stay

## 2024-06-13 ENCOUNTER — Inpatient Hospital Stay: Admitting: Oncology
